# Patient Record
Sex: Female | Born: 1975 | Race: Black or African American | Hispanic: No | Marital: Single | Smoking: Former smoker
Health system: Southern US, Community
[De-identification: ages and names within clinical notes are randomized; demographics above are authoritative.]

## PROBLEM LIST (undated history)

## (undated) DIAGNOSIS — F32A Depression, unspecified: Secondary | ICD-10-CM

## (undated) DIAGNOSIS — F329 Major depressive disorder, single episode, unspecified: Secondary | ICD-10-CM

## (undated) HISTORY — DX: Major depressive disorder, single episode, unspecified: F32.9

## (undated) HISTORY — PX: TUBAL LIGATION: SHX77

## (undated) HISTORY — DX: Depression, unspecified: F32.A

## (undated) HISTORY — PX: EPIGASTRIC HERNIA REPAIR: SUR1177

---

## 1997-10-07 ENCOUNTER — Emergency Department (HOSPITAL_COMMUNITY): Admission: EM | Admit: 1997-10-07 | Discharge: 1997-10-07 | Payer: Self-pay

## 1997-11-06 ENCOUNTER — Inpatient Hospital Stay (HOSPITAL_COMMUNITY): Admission: AD | Admit: 1997-11-06 | Discharge: 1997-11-06 | Payer: Self-pay | Admitting: Obstetrics

## 1998-08-03 ENCOUNTER — Emergency Department (HOSPITAL_COMMUNITY): Admission: EM | Admit: 1998-08-03 | Discharge: 1998-08-03 | Payer: Self-pay | Admitting: Emergency Medicine

## 1998-10-06 ENCOUNTER — Emergency Department (HOSPITAL_COMMUNITY): Admission: EM | Admit: 1998-10-06 | Discharge: 1998-10-06 | Payer: Self-pay | Admitting: Emergency Medicine

## 1998-11-24 ENCOUNTER — Emergency Department (HOSPITAL_COMMUNITY): Admission: EM | Admit: 1998-11-24 | Discharge: 1998-11-24 | Payer: Self-pay | Admitting: Internal Medicine

## 1999-02-12 ENCOUNTER — Inpatient Hospital Stay (HOSPITAL_COMMUNITY): Admission: AD | Admit: 1999-02-12 | Discharge: 1999-02-12 | Payer: Self-pay | Admitting: *Deleted

## 1999-04-03 ENCOUNTER — Inpatient Hospital Stay (HOSPITAL_COMMUNITY): Admission: AD | Admit: 1999-04-03 | Discharge: 1999-04-03 | Payer: Self-pay | Admitting: *Deleted

## 1999-05-07 ENCOUNTER — Emergency Department (HOSPITAL_COMMUNITY): Admission: EM | Admit: 1999-05-07 | Discharge: 1999-05-07 | Payer: Self-pay | Admitting: Emergency Medicine

## 1999-06-29 ENCOUNTER — Encounter: Payer: Self-pay | Admitting: Emergency Medicine

## 1999-06-29 ENCOUNTER — Emergency Department (HOSPITAL_COMMUNITY): Admission: EM | Admit: 1999-06-29 | Discharge: 1999-06-29 | Payer: Self-pay | Admitting: Emergency Medicine

## 1999-09-16 ENCOUNTER — Inpatient Hospital Stay (HOSPITAL_COMMUNITY): Admission: EM | Admit: 1999-09-16 | Discharge: 1999-09-17 | Payer: Self-pay | Admitting: *Deleted

## 1999-09-16 ENCOUNTER — Encounter: Payer: Self-pay | Admitting: Emergency Medicine

## 2000-01-04 ENCOUNTER — Inpatient Hospital Stay (HOSPITAL_COMMUNITY): Admission: AD | Admit: 2000-01-04 | Discharge: 2000-01-04 | Payer: Self-pay | Admitting: Obstetrics

## 2000-01-04 ENCOUNTER — Encounter: Payer: Self-pay | Admitting: *Deleted

## 2000-01-20 ENCOUNTER — Inpatient Hospital Stay (HOSPITAL_COMMUNITY): Admission: AD | Admit: 2000-01-20 | Discharge: 2000-01-20 | Payer: Self-pay | Admitting: Obstetrics

## 2000-06-26 ENCOUNTER — Inpatient Hospital Stay (HOSPITAL_COMMUNITY): Admission: AD | Admit: 2000-06-26 | Discharge: 2000-06-26 | Payer: Self-pay | Admitting: Obstetrics & Gynecology

## 2003-06-16 ENCOUNTER — Other Ambulatory Visit: Admission: RE | Admit: 2003-06-16 | Discharge: 2003-06-16 | Payer: Self-pay | Admitting: Obstetrics and Gynecology

## 2003-09-25 ENCOUNTER — Inpatient Hospital Stay (HOSPITAL_COMMUNITY): Admission: AD | Admit: 2003-09-25 | Discharge: 2003-09-25 | Payer: Self-pay | Admitting: *Deleted

## 2003-10-12 ENCOUNTER — Observation Stay (HOSPITAL_COMMUNITY): Admission: AD | Admit: 2003-10-12 | Discharge: 2003-10-13 | Payer: Self-pay | Admitting: Obstetrics and Gynecology

## 2003-11-27 ENCOUNTER — Inpatient Hospital Stay (HOSPITAL_COMMUNITY): Admission: AD | Admit: 2003-11-27 | Discharge: 2003-11-27 | Payer: Self-pay | Admitting: Obstetrics and Gynecology

## 2003-12-01 ENCOUNTER — Inpatient Hospital Stay (HOSPITAL_COMMUNITY): Admission: AD | Admit: 2003-12-01 | Discharge: 2003-12-01 | Payer: Self-pay | Admitting: Obstetrics and Gynecology

## 2003-12-02 ENCOUNTER — Inpatient Hospital Stay (HOSPITAL_COMMUNITY): Admission: AD | Admit: 2003-12-02 | Discharge: 2003-12-02 | Payer: Self-pay | Admitting: Obstetrics and Gynecology

## 2004-01-05 ENCOUNTER — Inpatient Hospital Stay (HOSPITAL_COMMUNITY): Admission: AD | Admit: 2004-01-05 | Discharge: 2004-01-05 | Payer: Self-pay | Admitting: Obstetrics and Gynecology

## 2004-01-08 ENCOUNTER — Inpatient Hospital Stay (HOSPITAL_COMMUNITY): Admission: AD | Admit: 2004-01-08 | Discharge: 2004-01-08 | Payer: Self-pay | Admitting: Obstetrics and Gynecology

## 2004-01-20 ENCOUNTER — Inpatient Hospital Stay (HOSPITAL_COMMUNITY): Admission: AD | Admit: 2004-01-20 | Discharge: 2004-01-20 | Payer: Self-pay | Admitting: Obstetrics and Gynecology

## 2004-01-31 ENCOUNTER — Inpatient Hospital Stay (HOSPITAL_COMMUNITY): Admission: AD | Admit: 2004-01-31 | Discharge: 2004-01-31 | Payer: Self-pay | Admitting: Obstetrics and Gynecology

## 2004-02-02 ENCOUNTER — Inpatient Hospital Stay (HOSPITAL_COMMUNITY): Admission: AD | Admit: 2004-02-02 | Discharge: 2004-02-04 | Payer: Self-pay | Admitting: Obstetrics and Gynecology

## 2004-04-01 ENCOUNTER — Emergency Department (HOSPITAL_COMMUNITY): Admission: EM | Admit: 2004-04-01 | Discharge: 2004-04-01 | Payer: Self-pay | Admitting: Emergency Medicine

## 2004-09-30 ENCOUNTER — Emergency Department (HOSPITAL_COMMUNITY): Admission: EM | Admit: 2004-09-30 | Discharge: 2004-10-01 | Payer: Self-pay | Admitting: Emergency Medicine

## 2005-04-17 ENCOUNTER — Emergency Department (HOSPITAL_COMMUNITY): Admission: EM | Admit: 2005-04-17 | Discharge: 2005-04-17 | Payer: Self-pay | Admitting: Emergency Medicine

## 2006-02-27 ENCOUNTER — Emergency Department (HOSPITAL_COMMUNITY): Admission: EM | Admit: 2006-02-27 | Discharge: 2006-02-27 | Payer: Self-pay | Admitting: Emergency Medicine

## 2006-03-30 ENCOUNTER — Inpatient Hospital Stay (HOSPITAL_COMMUNITY): Admission: AD | Admit: 2006-03-30 | Discharge: 2006-03-30 | Payer: Self-pay | Admitting: Obstetrics & Gynecology

## 2006-04-02 ENCOUNTER — Inpatient Hospital Stay (HOSPITAL_COMMUNITY): Admission: AD | Admit: 2006-04-02 | Discharge: 2006-04-02 | Payer: Self-pay | Admitting: Obstetrics

## 2006-05-05 ENCOUNTER — Emergency Department (HOSPITAL_COMMUNITY): Admission: EM | Admit: 2006-05-05 | Discharge: 2006-05-05 | Payer: Self-pay | Admitting: Emergency Medicine

## 2006-05-07 ENCOUNTER — Inpatient Hospital Stay (HOSPITAL_COMMUNITY): Admission: AD | Admit: 2006-05-07 | Discharge: 2006-05-07 | Payer: Self-pay | Admitting: Obstetrics & Gynecology

## 2006-05-13 ENCOUNTER — Inpatient Hospital Stay (HOSPITAL_COMMUNITY): Admission: AD | Admit: 2006-05-13 | Discharge: 2006-05-13 | Payer: Self-pay | Admitting: Obstetrics and Gynecology

## 2006-05-29 ENCOUNTER — Inpatient Hospital Stay (HOSPITAL_COMMUNITY): Admission: AD | Admit: 2006-05-29 | Discharge: 2006-05-29 | Payer: Self-pay | Admitting: Obstetrics and Gynecology

## 2006-06-26 ENCOUNTER — Inpatient Hospital Stay (HOSPITAL_COMMUNITY): Admission: AD | Admit: 2006-06-26 | Discharge: 2006-06-26 | Payer: Self-pay | Admitting: Obstetrics and Gynecology

## 2006-07-09 ENCOUNTER — Inpatient Hospital Stay (HOSPITAL_COMMUNITY): Admission: AD | Admit: 2006-07-09 | Discharge: 2006-07-09 | Payer: Self-pay | Admitting: Obstetrics and Gynecology

## 2006-08-08 ENCOUNTER — Inpatient Hospital Stay (HOSPITAL_COMMUNITY): Admission: AD | Admit: 2006-08-08 | Discharge: 2006-08-08 | Payer: Self-pay | Admitting: Obstetrics and Gynecology

## 2006-08-17 ENCOUNTER — Inpatient Hospital Stay (HOSPITAL_COMMUNITY): Admission: AD | Admit: 2006-08-17 | Discharge: 2006-08-17 | Payer: Self-pay | Admitting: Obstetrics and Gynecology

## 2006-08-25 ENCOUNTER — Inpatient Hospital Stay (HOSPITAL_COMMUNITY): Admission: AD | Admit: 2006-08-25 | Discharge: 2006-08-25 | Payer: Self-pay | Admitting: Obstetrics and Gynecology

## 2006-08-27 ENCOUNTER — Inpatient Hospital Stay (HOSPITAL_COMMUNITY): Admission: AD | Admit: 2006-08-27 | Discharge: 2006-08-27 | Payer: Self-pay | Admitting: Obstetrics and Gynecology

## 2006-08-28 ENCOUNTER — Inpatient Hospital Stay (HOSPITAL_COMMUNITY): Admission: AD | Admit: 2006-08-28 | Discharge: 2006-08-28 | Payer: Self-pay | Admitting: Obstetrics and Gynecology

## 2006-09-02 ENCOUNTER — Inpatient Hospital Stay (HOSPITAL_COMMUNITY): Admission: AD | Admit: 2006-09-02 | Discharge: 2006-09-03 | Payer: Self-pay | Admitting: Obstetrics and Gynecology

## 2006-09-26 ENCOUNTER — Inpatient Hospital Stay (HOSPITAL_COMMUNITY): Admission: AD | Admit: 2006-09-26 | Discharge: 2006-09-26 | Payer: Self-pay | Admitting: Obstetrics and Gynecology

## 2006-10-09 ENCOUNTER — Inpatient Hospital Stay (HOSPITAL_COMMUNITY): Admission: AD | Admit: 2006-10-09 | Discharge: 2006-10-10 | Payer: Self-pay | Admitting: Obstetrics and Gynecology

## 2006-10-27 ENCOUNTER — Inpatient Hospital Stay (HOSPITAL_COMMUNITY): Admission: AD | Admit: 2006-10-27 | Discharge: 2006-10-27 | Payer: Self-pay | Admitting: Obstetrics and Gynecology

## 2006-11-01 ENCOUNTER — Inpatient Hospital Stay (HOSPITAL_COMMUNITY): Admission: AD | Admit: 2006-11-01 | Discharge: 2006-11-01 | Payer: Self-pay | Admitting: Obstetrics and Gynecology

## 2006-11-03 ENCOUNTER — Inpatient Hospital Stay (HOSPITAL_COMMUNITY): Admission: AD | Admit: 2006-11-03 | Discharge: 2006-11-03 | Payer: Self-pay | Admitting: Obstetrics and Gynecology

## 2006-11-10 ENCOUNTER — Inpatient Hospital Stay (HOSPITAL_COMMUNITY): Admission: AD | Admit: 2006-11-10 | Discharge: 2006-11-11 | Payer: Self-pay | Admitting: Obstetrics and Gynecology

## 2006-11-14 ENCOUNTER — Encounter (INDEPENDENT_AMBULATORY_CARE_PROVIDER_SITE_OTHER): Payer: Self-pay | Admitting: Obstetrics and Gynecology

## 2006-11-14 ENCOUNTER — Inpatient Hospital Stay (HOSPITAL_COMMUNITY): Admission: AD | Admit: 2006-11-14 | Discharge: 2006-11-17 | Payer: Self-pay | Admitting: Obstetrics and Gynecology

## 2007-04-09 ENCOUNTER — Emergency Department (HOSPITAL_COMMUNITY): Admission: EM | Admit: 2007-04-09 | Discharge: 2007-04-09 | Payer: Self-pay | Admitting: Emergency Medicine

## 2007-09-13 ENCOUNTER — Emergency Department (HOSPITAL_COMMUNITY): Admission: EM | Admit: 2007-09-13 | Discharge: 2007-09-14 | Payer: Self-pay | Admitting: Emergency Medicine

## 2008-02-29 ENCOUNTER — Emergency Department (HOSPITAL_BASED_OUTPATIENT_CLINIC_OR_DEPARTMENT_OTHER): Admission: EM | Admit: 2008-02-29 | Discharge: 2008-02-29 | Payer: Self-pay | Admitting: Emergency Medicine

## 2008-09-19 ENCOUNTER — Emergency Department (HOSPITAL_COMMUNITY): Admission: EM | Admit: 2008-09-19 | Discharge: 2008-09-19 | Payer: Self-pay | Admitting: Emergency Medicine

## 2009-01-11 ENCOUNTER — Emergency Department (HOSPITAL_COMMUNITY): Admission: EM | Admit: 2009-01-11 | Discharge: 2009-01-11 | Payer: Self-pay | Admitting: Emergency Medicine

## 2009-08-10 ENCOUNTER — Emergency Department (HOSPITAL_COMMUNITY): Admission: EM | Admit: 2009-08-10 | Discharge: 2009-08-11 | Payer: Self-pay | Admitting: Emergency Medicine

## 2009-12-10 ENCOUNTER — Emergency Department (HOSPITAL_COMMUNITY): Admission: EM | Admit: 2009-12-10 | Discharge: 2009-12-10 | Payer: Self-pay | Admitting: Emergency Medicine

## 2010-08-13 LAB — DIFFERENTIAL
Lymphocytes Relative: 40 % (ref 12–46)
Lymphs Abs: 3 10*3/uL (ref 0.7–4.0)
Monocytes Absolute: 0.5 10*3/uL (ref 0.1–1.0)
Neutro Abs: 3.6 10*3/uL (ref 1.7–7.7)

## 2010-08-13 LAB — URINALYSIS, ROUTINE W REFLEX MICROSCOPIC
Glucose, UA: NEGATIVE mg/dL
Nitrite: NEGATIVE
pH: 6 (ref 5.0–8.0)

## 2010-08-13 LAB — CBC
HCT: 27.7 % — ABNORMAL LOW (ref 36.0–46.0)
MCHC: 31 g/dL (ref 30.0–36.0)
RBC: 4.21 MIL/uL (ref 3.87–5.11)
WBC: 7.6 10*3/uL (ref 4.0–10.5)

## 2010-08-13 LAB — POCT I-STAT, CHEM 8
BUN: 7 mg/dL (ref 6–23)
Calcium, Ion: 1.34 mmol/L — ABNORMAL HIGH (ref 1.12–1.32)
Chloride: 104 mEq/L (ref 96–112)
TCO2: 28 mmol/L (ref 0–100)

## 2010-08-13 LAB — POCT CARDIAC MARKERS

## 2010-09-17 NOTE — Op Note (Signed)
Christina Valenzuela, Christina Valenzuela                ACCOUNT NO.:  [id number]   MEDICAL RECORD NO.:  [id number]          PATIENT TYPE:  INP   LOCATION:  9148                          FACILITY:  [facility]   PHYSICIAN:  Hal Morales, M.D.DATE OF BIRTH:  [birth year]   DATE OF PROCEDURE:  11/14/2006  DATE OF DISCHARGE:                               OPERATIVE REPORT   PREOPERATIVE DIAGNOSES:  Intrauterine pregnancy at 37-4/[redacted] weeks  gestation, prior cesarean section, nonreassuring fetal heart rate  tracing, maternal exhaustion, maternal anemia and desire for surgical  sterilization.   POSTOPERATIVE DIAGNOSES:  Intrauterine pregnancy at 37-4/[redacted] weeks  gestation, prior cesarean section, nonreassuring fetal heart rate  tracing, maternal exhaustion, maternal anemia and desire for surgical  sterilization.   OPERATION:  Repeat low transverse cesarean section and bilateral tubal  ligation.   SURGEON:  Hal Morales, M.D.   FIRST ASSISTANT:  Erin Sons, certified nurse midwife.   ANESTHESIA:  Epidural.   ESTIMATED BLOOD LOSS:  500 mL.   COMPLICATIONS:  None.   FINDINGS:  The patient was delivered of a female infant whose name is  [place] weighing 6 pounds with Apgars of 9 and 10 at 1 and 5 minutes  respectively.  The uterus, tubes and ovaries were normal for the gravid  state.  The placenta contained an eccentrically inserted three-vessel  cord.   PROCEDURE:  A discussion was held with the patient and father of the  pregnancy concerning progress of the labor.  The patient had become  completely dilated and had pushed for short period of time, however was  feeling significant exhaustion and stated that she was unable to push  any longer.  She complained of dizziness.  At this time the fetal heart  rate began to exhibit deep variable decelerations to the 70s with each  contraction.  The fetal vertex was at +3.  At that time a discussion was  held that several options existed which was observation, one  was  continued maternal pushing, another was vacuum-assisted vaginal  delivery, and another was repeat cesarean section.  The risks and  benefits of each of these options was reviewed and the patient decided  to proceed with repeat low transverse cesarean section.   PROCEDURE:  The patient was brought to the operating room with labor  epidural in place.  This was dosed for surgical anesthesia.  She was  placed on the operating table in the supine position with a left lateral  tilt.  The abdomen and perineum were prepped with multiple layers of  Betadine and a Foley catheter inserted into the bladder and connected to  straight drainage.  The abdomen was draped as a sterile field.  Once  adequate surgical anesthesia was ensured approximately 17 mL of quarter  percent Marcaine was used to inject the site of the previous cesarean  section incision.  The transverse incision was made at the site and the  abdomen opened in layers.  The peritoneum was entered and the bladder  blade placed.  The uterus was incised approximately 2 cm above the  uterovesical fold and the  infant was delivered from the occiput  transverse position with the aid of a kiwi vacuum extractor.  After the  vertex had been pushed from the perineal area.  The nares and pharynx  were suctioned and the cord clamped and cut.  The infant was handed off  to the awaiting pediatricians.  The placenta was allowed to separate  from the uterus and was removed from the operative field.  The staff  from [facility] drew appropriate cord blood.  The  uterine incision was closed with a running interlocking suture of 0  Vicryl and imbricating suture of 0 Vicryl was placed with adequate  hemostasis.  Copious irrigation was carried out.  The left fallopian  tube was identified, followed to its fimbriated end, then grasped at the  isthmic portion and elevated.  A suture of 2-0 chromic was placed  through the mesosalpinx and  tied fore and aft on the knuckle of tube.  A  second ligature was placed proximal to that and the intervening knuckle  of tube excised, a small amount of 0.25% Marcaine placed over the tube  cut, and cut ends cauterized.  Hemostasis was noted to be adequate and a  similar procedure was carried out on the opposite side to the right  fallopian tube.  Portions of tube from the right and left were removed  from the operative field.  The abdominal peritoneum was closed with a  running suture of 2-0 Vicryl.  The rectus muscles were reapproximated in  the midline with figure-of-eight suture of 2-0 Vicryl.  The rectus  fascia was closed with a running suture of 0 Vicryl then reinforced on  either side of midline with figure-of-eight sutures of 0 Vicryl.  The  subcutaneous tissue was copiously irrigated and made hemostatic with  Bovie cautery.  A subcuticular suture of 3-0 Monocryl was used to close  the skin incision.  Steri-Strips were applied to the incision and a  sterile dressing applied.  The patient was taken from the operating room  to the recovery room in satisfactory condition having tolerated the  procedure well with sponge and instrument counts correct.   SPECIMENS:  The placenta was sent to labor and delivery.      Hal Morales, M.D.  Electronically Signed     VPH/MEDQ  D:  11/14/2006  T:  11/15/2006  Job:  [id number]
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
## 2010-09-20 NOTE — Op Note (Signed)
[place]  Patient:    ALLEYA, DEMETER                         MRN: [id number] Proc. Date: 09/16/99 Adm. Date:  [id number] Attending:  Glenna Fellows [place]                           Operative Report  PREOPERATIVE DIAGNOSIS:  Incarcerated epigastric hernia.  POSTOPERATIVE DIAGNOSIS:  Incarcerated epigastric hernia.  PROCEDURE:  Repair of incarcerated epigastric hernia with mesh.  SURGEON:  Lorne Skeens. Hoxworth, M.D.  ANESTHESIA:  General anesthesia.  INDICATIONS:  Sereniti Wan is a 35 year old black female who presents with 18 hours of a painful mass in her epigastrium and examination shows a tender 2 cm mass above the umbilicus consistent with an incarcerated hernia.  She has had nausea without vomiting.  Emergency repair and general anesthesia has been recommended and accepted.  The nature of the procedure, indications, risks of bleeding, infection, and recurrence was discussed, understood preoperatively.  DESCRIPTION OF PROCEDURE:  The patient was brought to the operating room and placed in the supine position on the operating table and general endotracheal anesthesia was induced.  Ancef was given intravenously.  The abdomen was sterilely prepped and draped.  A vertical incision was made directly over the mass with dissection carried down through the subcutaneous tissue.  The hernia sac was sharply dissected free from surrounding subcutaneous tissue, down to the level of the fascia. The hernia content spontaneously reduced during dissection.  The hernia sac was opened and it contained no incarcerated viscera at that time and the sac was completely excised at the level of the fascia with cautery.  The defect was about 1 cm.  A  couple of [employer] retractors were placed through the defect and the underlying viscera examined and there was no evidence of any intestinal damage or cloudy fluid.  Following this, the subcutaneous  tissue was cleared back off the fascia for several cm in each direction.  The fascial defect was then primarily closed with interrupted 0 Prolene.  A several cm Prolene mesh patch was then used as an onlay and sutured to the periphery with interrupted 2-0 Prolene.  The soft tissue was  infiltrated with Marcaine, irrigated, and inspected for complete hemostasis. The subcu was reapproximated with interrupted 4-0 Monocryl and the skin with running subcuticular 4-0 Monocryl and Steri-Strips.  Sponge, needle, and instrument counts were correct.  A dry sterile dressing was applied and the patient taken to the recovery room in good condition. DD:  09/16/99 TD:  09/17/99 Job: [id number]
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
## 2010-09-20 NOTE — Discharge Summary (Signed)
Christina Valenzuela, Christina Valenzuela NO.:  [id number]   MEDICAL RECORD NO.:  [id number]                   PATIENT TYPE:  INP   LOCATION:  9153                                 [facility]ACILITY:  [facility]   PHYSICIAN:  Janine Limbo, M.D.            DATE OF BIRTH:  April 17, 1976   DATE OF ADMISSION:  10/12/2003  DATE OF DISCHARGE:  10/13/2003                                 DISCHARGE SUMMARY   ADMISSION DIAGNOSES:  1. Intrauterine pregnancy at 23 weeks.  2. Pre-term cervical change.  3. No active labor noted.   DISCHARGE DIAGNOSES:  1. Intrauterine pregnancy at 23 weeks.  2. Pre-term cervical change.  3. No active labor noted.  4. Fetal fibronectin negative.   PROCEDURE:  1. Electronic fetal monitoring.  2. Bedside ultrasound.   HOSPITAL COURSE:  The patient was admitted with abdominal cramping and was  noted to have a cervix dilated slightly at fingertip 50% with the vertex  ballottable but pressure against the cervix was noted by the vertex, which  was presenting. She was tested for infections and fetal fibronectin and  admitted for medication, bedrest, and monitoring. She did well overnight,  having few contractions. On October 13, 2003, she was having some back pain,  which had been pre-existing from months previously but did not have any  contractions. Fetal heart rate was 140 to 150 with rare uterine  contractions, positive fetal movement. Abdomen was soft and nontender.  Cervix was closed 25% and ballottable with no pressure appreciated on the  cervix and decision was made at that time to discharge her home on Ibuprofen  and return to the office in 1 week.   DISCHARGE MEDICATIONS:  Motrin 600 mg p.o. q. 6 hours p.r.n.   LABORATORY DATA:  Gonorrhea and Chlamydia negative. Urinalysis showed few  bacteria. Culture is pending. Wet prep remarkable only for a few white blood  cells with many bacteria seen. Group B strep pending. Fetal fibronectin  negative.   DISCHARGE  INSTRUCTIONS:  The patient is to maintain level 2 bedrest and  monitor for contractions and fetal movement and report any problems.   FOLLOW UP:  In 1 week at [facility] and Gynecologic  Services or p.r.n.     Marie L. Williams, C.N.M.                 Janine Limbo, M.D.   MLW/MEDQ  D:  10/13/2003  T:  10/14/2003  Job:  [id number]

## 2011-02-18 LAB — TYPE AND SCREEN
ABO/RH(D): O POS
Antibody Screen: POSITIVE
DAT, IgG: NEGATIVE
PT AG Type: NEGATIVE

## 2011-02-18 LAB — CBC
HCT: 24 — ABNORMAL LOW
Hemoglobin: 7.7 — CL
MCHC: 31.9
MCV: 67.9 — ABNORMAL LOW
MCV: 69.1 — ABNORMAL LOW
Platelets: 394
RBC: 3.53 — ABNORMAL LOW
RDW: 17.8 — ABNORMAL HIGH
WBC: 15.1 — ABNORMAL HIGH

## 2011-02-18 LAB — RPR: RPR Ser Ql: NONREACTIVE

## 2011-02-19 LAB — URINALYSIS, ROUTINE W REFLEX MICROSCOPIC
Bilirubin Urine: NEGATIVE
Nitrite: NEGATIVE
Protein, ur: 30 — AB
Protein, ur: NEGATIVE
pH: 6
pH: 6.5

## 2011-02-20 LAB — URINALYSIS, ROUTINE W REFLEX MICROSCOPIC
Bilirubin Urine: NEGATIVE
Hgb urine dipstick: NEGATIVE
Ketones, ur: 15 — AB
Nitrite: NEGATIVE
Urobilinogen, UA: 4 — ABNORMAL HIGH
pH: 6.5

## 2011-02-20 LAB — STREP B DNA PROBE: Strep Group B Ag: NEGATIVE

## 2011-02-20 LAB — GC/CHLAMYDIA PROBE AMP, GENITAL: GC Probe Amp, Genital: NEGATIVE

## 2011-02-20 LAB — FETAL FIBRONECTIN: Fetal Fibronectin: NEGATIVE

## 2011-02-23 ENCOUNTER — Emergency Department (HOSPITAL_COMMUNITY)
Admission: EM | Admit: 2011-02-23 | Discharge: 2011-02-23 | Disposition: A | Discharge status: Home / Self Care | Payer: Self-pay | Attending: Emergency Medicine | Admitting: Emergency Medicine

## 2011-02-23 DIAGNOSIS — R109 Unspecified abdominal pain: Secondary | ICD-10-CM | POA: Insufficient documentation

## 2011-02-23 DIAGNOSIS — R10819 Abdominal tenderness, unspecified site: Secondary | ICD-10-CM | POA: Insufficient documentation

## 2011-02-23 DIAGNOSIS — K5900 Constipation, unspecified: Secondary | ICD-10-CM | POA: Insufficient documentation

## 2011-02-23 LAB — URINALYSIS, ROUTINE W REFLEX MICROSCOPIC
Bilirubin Urine: NEGATIVE
Glucose, UA: NEGATIVE mg/dL
Hgb urine dipstick: NEGATIVE
Ketones, ur: NEGATIVE mg/dL
Nitrite: NEGATIVE
Protein, ur: NEGATIVE mg/dL
Specific Gravity, Urine: 1.026 (ref 1.005–1.030)
pH: 5 (ref 5.0–8.0)

## 2011-02-23 LAB — WET PREP, GENITAL
Clue Cells Wet Prep HPF POC: NONE SEEN
WBC, Wet Prep HPF POC: NONE SEEN
Yeast Wet Prep HPF POC: NONE SEEN

## 2011-02-23 LAB — PREGNANCY, URINE: Preg Test, Ur: NEGATIVE

## 2011-07-07 ENCOUNTER — Other Ambulatory Visit: Payer: Self-pay | Admitting: Obstetrics

## 2011-07-17 ENCOUNTER — Emergency Department (HOSPITAL_BASED_OUTPATIENT_CLINIC_OR_DEPARTMENT_OTHER)
Admission: EM | Admit: 2011-07-17 | Discharge: 2011-07-17 | Disposition: A | Discharge status: Home Health | Payer: Self-pay | Attending: Emergency Medicine | Admitting: Emergency Medicine

## 2011-07-17 ENCOUNTER — Encounter (HOSPITAL_BASED_OUTPATIENT_CLINIC_OR_DEPARTMENT_OTHER): Payer: Self-pay | Admitting: *Deleted

## 2011-07-17 DIAGNOSIS — B9689 Other specified bacterial agents as the cause of diseases classified elsewhere: Secondary | ICD-10-CM | POA: Insufficient documentation

## 2011-07-17 DIAGNOSIS — R109 Unspecified abdominal pain: Secondary | ICD-10-CM | POA: Insufficient documentation

## 2011-07-17 DIAGNOSIS — N760 Acute vaginitis: Secondary | ICD-10-CM | POA: Insufficient documentation

## 2011-07-17 DIAGNOSIS — A499 Bacterial infection, unspecified: Secondary | ICD-10-CM | POA: Insufficient documentation

## 2011-07-17 DIAGNOSIS — K5900 Constipation, unspecified: Secondary | ICD-10-CM | POA: Insufficient documentation

## 2011-07-17 LAB — URINALYSIS, ROUTINE W REFLEX MICROSCOPIC
Glucose, UA: NEGATIVE mg/dL
Hgb urine dipstick: NEGATIVE
Ketones, ur: 15 mg/dL — AB
Protein, ur: NEGATIVE mg/dL

## 2011-07-17 LAB — WET PREP, GENITAL: WBC, Wet Prep HPF POC: NONE SEEN

## 2011-07-17 LAB — PREGNANCY, URINE: Preg Test, Ur: NEGATIVE

## 2011-07-17 MED ORDER — METRONIDAZOLE 500 MG PO TABS: 500.0000 mg | ORAL_TABLET | Freq: Two times a day (BID) | ORAL | Status: AC

## 2011-07-17 NOTE — ED Notes (Signed)
Pt. Reports she had diarrhea last wk till Monday.  Pt. Reports trouble with bowels.
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
## 2011-07-17 NOTE — Discharge Instructions (Signed)
Bacterial Vaginosis Bacterial vaginosis is an infection of the vagina. A healthy vagina has many kinds of good germs (bacteria). Sometimes the number of good germs can change. This allows bad germs to move in and cause an infection. You may be given medicine (antibiotics) to treat the infection. Or, you may not need treatment at all. HOME CARE  Take your medicine as told. Finish them even if you start to feel better.   Do not have sex until you finish your medicine.   Do not douche.   Practice safe sex.   Tell your sex partner that you have an infection. They should see their doctor for treatment if they have problems.  GET HELP RIGHT AWAY IF:  You do not get better after 3 days of treatment.   You have grey fluid (discharge) coming from your vagina.   You have pain.   You have a temperature of 102 F (38.9 C) or higher.  MAKE SURE YOU:   Understand these instructions.   Will watch your condition.   Will get help right away if you are not doing well or get worse.  Document Released: 01/29/2008 Document Revised: 04/10/2011 Document Reviewed: 01/29/2008 [facility] Patient Information 2012 [place], [place].Constipation in Adults Constipation is having fewer than 2 bowel movements per week. Usually, the stools are hard. As we grow older, constipation is more common. If you try to fix constipation with laxatives, the problem may get worse. This is because laxatives taken over a long period of time make the colon muscles weaker. A low-fiber diet, not taking in enough fluids, and taking some medicines may make these problems worse. MEDICATIONS THAT MAY CAUSE CONSTIPATION  Water pills (diuretics).   Calcium channel blockers (used to control blood pressure and for the heart).   Certain pain medicines (narcotics).   Anticholinergics.   Anti-inflammatory agents.   Antacids that contain aluminum.  DISEASES THAT CONTRIBUTE TO CONSTIPATION  Diabetes.   Parkinson's disease.    Dementia.   Stroke.   Depression.   Illnesses that cause problems with salt and water metabolism.  HOME CARE INSTRUCTIONS   Constipation is usually best cared for without medicines. Increasing dietary fiber and eating more fruits and vegetables is the best way to manage constipation.   Slowly increase fiber intake to 25 to 38 grams per day. Whole grains, fruits, vegetables, and legumes are good sources of fiber. A dietitian can further help you incorporate high-fiber foods into your diet.   Drink enough water and fluids to keep your urine clear or pale yellow.   A fiber supplement may be added to your diet if you cannot get enough fiber from foods.   Increasing your activities also helps improve regularity.   Suppositories, as suggested by your caregiver, will also help. If you are using antacids, such as aluminum or calcium containing products, it will be helpful to switch to products containing magnesium if your caregiver says it is okay.   If you have been given a liquid injection (enema) today, this is only a temporary measure. It should not be relied on for treatment of longstanding (chronic) constipation.   Stronger measures, such as magnesium sulfate, should be avoided if possible. This may cause uncontrollable diarrhea. Using magnesium sulfate may not allow you time to make it to the bathroom.  SEEK IMMEDIATE MEDICAL CARE IF:   There is bright red blood in the stool.   The constipation stays for more than 4 days.   There is belly (abdominal) or  rectal pain.   You do not seem to be getting better.   You have any questions or concerns.  MAKE SURE YOU:   Understand these instructions.   Will watch your condition.   Will get help right away if you are not doing well or get worse.  Document Released: 01/18/2004 Document Revised: 04/10/2011 Document Reviewed: 03/25/2011 [facility] Patient Information 2012 [place], [place].

## 2011-07-18 LAB — GC/CHLAMYDIA PROBE AMP, GENITAL: Chlamydia, DNA Probe: NEGATIVE

## 2011-07-18 NOTE — ED Provider Notes (Signed)
Medical screening examination/treatment/procedure(s) were performed by non-physician practitioner and as supervising physician I was immediately available for consultation/collaboration.   Shelda Jakes, MD 07/18/11 2110

## 2011-08-03 ENCOUNTER — Encounter (HOSPITAL_BASED_OUTPATIENT_CLINIC_OR_DEPARTMENT_OTHER): Payer: Self-pay | Admitting: Emergency Medicine

## 2011-08-03 ENCOUNTER — Emergency Department (INDEPENDENT_AMBULATORY_CARE_PROVIDER_SITE_OTHER): Payer: Self-pay

## 2011-08-03 ENCOUNTER — Emergency Department (HOSPITAL_BASED_OUTPATIENT_CLINIC_OR_DEPARTMENT_OTHER): Payer: Self-pay

## 2011-08-03 ENCOUNTER — Emergency Department (HOSPITAL_BASED_OUTPATIENT_CLINIC_OR_DEPARTMENT_OTHER)
Admission: EM | Admit: 2011-08-03 | Discharge: 2011-08-03 | Disposition: A | Discharge status: Home / Self Care | Payer: Self-pay | Attending: Emergency Medicine | Admitting: Emergency Medicine

## 2011-08-03 DIAGNOSIS — S46909A Unspecified injury of unspecified muscle, fascia and tendon at shoulder and upper arm level, unspecified arm, initial encounter: Secondary | ICD-10-CM

## 2011-08-03 DIAGNOSIS — S4980XA Other specified injuries of shoulder and upper arm, unspecified arm, initial encounter: Secondary | ICD-10-CM

## 2011-08-03 DIAGNOSIS — IMO0001 Reserved for inherently not codable concepts without codable children: Secondary | ICD-10-CM | POA: Insufficient documentation

## 2011-08-03 DIAGNOSIS — M25519 Pain in unspecified shoulder: Secondary | ICD-10-CM | POA: Insufficient documentation

## 2011-08-03 DIAGNOSIS — X58XXXA Exposure to other specified factors, initial encounter: Secondary | ICD-10-CM

## 2011-08-03 DIAGNOSIS — Y92009 Unspecified place in unspecified non-institutional (private) residence as the place of occurrence of the external cause: Secondary | ICD-10-CM | POA: Insufficient documentation

## 2011-08-03 DIAGNOSIS — IMO0002 Reserved for concepts with insufficient information to code with codable children: Secondary | ICD-10-CM | POA: Insufficient documentation

## 2011-08-03 MED ORDER — IBUPROFEN 800 MG PO TABS: 800.0000 mg | ORAL_TABLET | Freq: Three times a day (TID) | ORAL | Status: DC

## 2011-08-03 MED ORDER — HYDROCODONE-ACETAMINOPHEN 5-325 MG PO TABS: 2.0000 | ORAL_TABLET | ORAL | Status: AC | PRN

## 2011-08-03 MED ORDER — IBUPROFEN 800 MG PO TABS: 800.0000 mg | ORAL_TABLET | Freq: Three times a day (TID) | ORAL | Status: AC
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
## 2011-08-03 NOTE — Discharge Instructions (Signed)
Ligament Sprain  Ligaments are tough, fibrous tissues that hold bones together at the joints. A sprain can occur when a ligament is stretched. This injury may take several weeks to heal.  HOME CARE INSTRUCTIONS    Rest the injured area for as long as directed by your caregiver. Then slowly start using the joint as directed by your caregiver and as the pain allows.   Keep the affected joint raised if possible to lessen swelling.   Apply ice for 15 to 20 minutes to the injured area every couple hours for the first half day, then 3 to 4 times per day for the first 48 hours. Put the ice in a plastic bag and place a towel between the bag of ice and your skin.   Wear any splinting, casting, or elastic bandage applications as instructed.   Only take over-the-counter or prescription medicines for pain, discomfort, or fever as directed by your caregiver. Do not use aspirin immediately after the injury unless instructed by your caregiver. Aspirin can cause increased bleeding and bruising of the tissues.   If you were given crutches, continue to use them as instructed and do not resume weight bearing on the affected extremity until instructed.  SEEK MEDICAL CARE IF:    Your bruising, swelling, or pain increases.   You have cold and numb fingers or toes if your arm or leg was injured.  SEEK IMMEDIATE MEDICAL CARE IF:    Your toes are numb or blue if your leg was injured.   Your fingers are numb or blue if your arm was injured.   Your pain is not responding to medicines and continues to stay the same or gets worse.  MAKE SURE YOU:    Understand these instructions.   Will watch your condition.   Will get help right away if you are not doing well or get worse.  Document Released: 04/18/2000 Document Revised: 04/10/2011 Document Reviewed: 02/15/2008  [employer] Patient Information 2012 [employer].

## 2011-08-04 NOTE — ED Provider Notes (Signed)
Medical screening examination/treatment/procedure(s) were performed by non-physician practitioner and as supervising physician I was immediately available for consultation/collaboration.   Forbes Cellar, MD 08/04/11 [phone]

## 2012-01-26 ENCOUNTER — Encounter (HOSPITAL_BASED_OUTPATIENT_CLINIC_OR_DEPARTMENT_OTHER): Payer: Self-pay | Admitting: *Deleted

## 2012-01-26 ENCOUNTER — Emergency Department (HOSPITAL_BASED_OUTPATIENT_CLINIC_OR_DEPARTMENT_OTHER)
Admission: EM | Admit: 2012-01-26 | Discharge: 2012-01-26 | Disposition: A | Discharge status: Home / Self Care | Payer: Self-pay | Attending: Emergency Medicine | Admitting: Emergency Medicine

## 2012-01-26 DIAGNOSIS — N760 Acute vaginitis: Secondary | ICD-10-CM | POA: Insufficient documentation

## 2012-01-26 DIAGNOSIS — A499 Bacterial infection, unspecified: Secondary | ICD-10-CM | POA: Insufficient documentation

## 2012-01-26 DIAGNOSIS — B9689 Other specified bacterial agents as the cause of diseases classified elsewhere: Secondary | ICD-10-CM | POA: Insufficient documentation

## 2012-01-26 DIAGNOSIS — F17200 Nicotine dependence, unspecified, uncomplicated: Secondary | ICD-10-CM | POA: Insufficient documentation

## 2012-01-26 LAB — URINALYSIS, ROUTINE W REFLEX MICROSCOPIC
Bilirubin Urine: NEGATIVE
Glucose, UA: NEGATIVE mg/dL
Ketones, ur: NEGATIVE mg/dL
Leukocytes, UA: NEGATIVE
Protein, ur: NEGATIVE mg/dL

## 2012-01-26 LAB — WET PREP, GENITAL

## 2012-01-26 MED ORDER — METRONIDAZOLE 500 MG PO TABS: 500.0000 mg | ORAL_TABLET | Freq: Two times a day (BID) | ORAL | Status: DC

## 2012-01-26 NOTE — ED Notes (Signed)
Pt amb to triage with quick steady gait in nad. Pt reports having vaginal itching x 3 months, states she used to take diflucan before she lost her health insurance and it cleared up her symptoms.  Pt states she has been using otc creams with no relief. Denies any spotting or bleeding.

## 2012-01-26 NOTE — Discharge Instructions (Signed)
It is important to complete the entire course of antibiotics. Return to the emergency department if your symptoms worsen. Bacterial Vaginosis Bacterial vaginosis (BV) is a vaginal infection where the normal balance of bacteria in the vagina is disrupted. The normal balance is then replaced by an overgrowth of certain bacteria. There are several different kinds of bacteria that can cause BV. BV is the most common vaginal infection in women of childbearing age. CAUSES   The cause of BV is not fully understood. BV develops when there is an increase or imbalance of harmful bacteria.   Some activities or behaviors can upset the normal balance of bacteria in the vagina and put women at increased risk including:   Having a new sex partner or multiple sex partners.   Douching.   Using an intrauterine device (IUD) for contraception.   It is not clear what role sexual activity plays in the development of BV. However, women that have never had sexual intercourse are rarely infected with BV.  Women do not get BV from toilet seats, bedding, swimming pools or from touching objects around them.  SYMPTOMS   Grey vaginal discharge.   A fish-like odor with discharge, especially after sexual intercourse.   Itching or burning of the vagina and vulva.   Burning or pain with urination.   Some women have no signs or symptoms at all.  DIAGNOSIS  Your caregiver must examine the vagina for signs of BV. Your caregiver will perform lab tests and look at the sample of vaginal fluid through a microscope. They will look for bacteria and abnormal cells (clue cells), a pH test higher than 4.5, and a positive amine test all associated with BV.  RISKS AND COMPLICATIONS   Pelvic inflammatory disease (PID).   Infections following gynecology surgery.   Developing HIV.   Developing herpes virus.  TREATMENT  Sometimes BV will clear up without treatment. However, all women with symptoms of BV should be treated to  avoid complications, especially if gynecology surgery is planned. Female partners generally do not need to be treated. However, BV may spread between female sex partners so treatment is helpful in preventing a recurrence of BV.   BV may be treated with antibiotics. The antibiotics come in either pill or vaginal cream forms. Either can be used with nonpregnant or pregnant women, but the recommended dosages differ. These antibiotics are not harmful to the baby.   BV can recur after treatment. If this happens, a second round of antibiotics will often be prescribed.   Treatment is important for pregnant women. If not treated, BV can cause a premature delivery, especially for a pregnant woman who had a premature birth in the past. All pregnant women who have symptoms of BV should be checked and treated.   For chronic reoccurrence of BV, treatment with a type of prescribed gel vaginally twice a week is helpful.  HOME CARE INSTRUCTIONS   Finish all medication as directed by your caregiver.   Do not have sex until treatment is completed.   Tell your sexual partner that you have a vaginal infection. They should see their caregiver and be treated if they have problems, such as a mild rash or itching.   Practice safe sex. Use condoms. Only have 1 sex partner.  PREVENTION  Basic prevention steps can help reduce the risk of upsetting the natural balance of bacteria in the vagina and developing BV:  Do not have sexual intercourse (be abstinent).   Do not douche.  Use all of the medicine prescribed for treatment of BV, even if the signs and symptoms go away.   Tell your sex partner if you have BV. That way, they can be treated, if needed, to prevent reoccurrence.  SEEK MEDICAL CARE IF:   Your symptoms are not improving after 3 days of treatment.   You have increased discharge, pain, or fever.  MAKE SURE YOU:   Understand these instructions.   Will watch your condition.   Will get help right  away if you are not doing well or get worse.  FOR MORE INFORMATION  Division of STD Prevention (DSTDP), Centers for Disease Control and Prevention: [employer] American Social Health Association (ASHA): www.ashastd.org  Document Released: 04/21/2005 Document Revised: 04/10/2011 Document Reviewed: 10/12/2008 [facility] Patient Information 2012 [place], [place].

## 2012-01-26 NOTE — ED Provider Notes (Signed)
History     CSN: [id number]  Arrival date & time 01/26/12  1129   First MD Initiated Contact with Patient 01/26/12 1205      Chief Complaint  Patient presents with  . Vaginal Itching    (Consider location/radiation/quality/duration/timing/severity/associated sxs/prior treatment) HPI Comments: 36 year old female presents to the ED complaining of right-sided vaginal irritation x 2 days. She states that she is prone to yeast infections, and has been having white discharge for the past couple weeks. The right side of her vagina is itchy. She's been using over-the-counter Monistat without any relief. Also admits to increased urinary frequency. Denies vaginal bleeding, however she just finished her menstrual cycle. Denies any abdominal or pelvic pain. Denies fever, chills, dysuria, hematuria, dyspareunia. She sexually active with her husband. No chance of pregnancy as her tubes are tied.  Patient is a 36 y.o. female presenting with vaginal itching. The history is provided by the patient.  Vaginal Itching Pertinent negatives include no abdominal pain, chest pain, chills, fever, nausea, rash or weakness.    History reviewed. No pertinent past medical history.  Past Surgical History  Procedure Date  . Tubal ligation   . Hernia repair   . Cesarean section     History reviewed. No pertinent family history.  History  Substance Use Topics  . Smoking status: Current Every Day Smoker  . Smokeless tobacco: Not on file  . Alcohol Use: Yes     social    OB History    Grav Para Term Preterm Abortions TAB SAB Ect Mult Living                  Review of Systems  Constitutional: Negative for fever, chills and activity change.  Respiratory: Negative for shortness of breath.   Cardiovascular: Negative for chest pain.  Gastrointestinal: Negative for nausea and abdominal pain.  Genitourinary: Positive for frequency, vaginal bleeding, vaginal discharge and vaginal pain. Negative for dysuria,  urgency, flank pain, difficulty urinating, menstrual problem and pelvic pain.  Musculoskeletal: Negative for back pain.  Skin: Negative for rash.  Neurological: Negative for weakness.    Allergies  Review of patient's allergies indicates no known allergies.  Home Medications   Current Outpatient Rx  Name Route Sig Dispense Refill  . CYCLOBENZAPRINE HCL 5 MG PO TABS Oral Take 5 mg by mouth 3 (three) times daily as needed. Patient used this medication for her shoulder pain.    . IBUPROFEN 200 MG PO TABS Oral Take 200 mg by mouth every 6 (six) hours as needed. Patient used this medication for cramps.      BP 100/69  Pulse 80  Temp 98.3 F (36.8 C) (Oral)  Resp 18  SpO2 100%  LMP 01/19/2012  Physical Exam  Constitutional: She is oriented to person, place, and time. She appears well-developed and well-nourished. No distress.  HENT:  Head: Normocephalic and atraumatic.  Eyes: Conjunctivae normal are normal.  Neck: Normal range of motion.  Cardiovascular: Normal rate, regular rhythm and normal heart sounds.   Pulmonary/Chest: Effort normal and breath sounds normal.  Abdominal: Soft. Bowel sounds are normal. She exhibits no mass. There is no tenderness. There is no CVA tenderness.  Genitourinary: Uterus normal. There is tenderness on the right labia. Cervix exhibits discharge (Thick white/yellow malodorous). Cervix exhibits no motion tenderness and no friability. Right adnexum displays no tenderness and no fullness. Left adnexum displays no tenderness and no fullness. There is erythema and tenderness around the vagina. No bleeding around the vagina.  Vaginal discharge (copious white/yellow malodorous) found.  Musculoskeletal: Normal range of motion.  Neurological: She is alert and oriented to person, place, and time.  Skin: Skin is warm and dry. No erythema.  Psychiatric: She has a normal mood and affect. Her behavior is normal.    ED Course  Procedures (including critical care  time)   Labs Reviewed  URINALYSIS, ROUTINE W REFLEX MICROSCOPIC  WET PREP, GENITAL  GC/CHLAMYDIA PROBE AMP, GENITAL   Results for orders placed during the hospital encounter of 01/26/12  URINALYSIS, ROUTINE W REFLEX MICROSCOPIC      Component Value Range   Color, Urine YELLOW  YELLOW   APPearance CLEAR  CLEAR   Specific Gravity, Urine 1.027  1.005 - 1.030   pH 6.0  5.0 - 8.0   Glucose, UA NEGATIVE  NEGATIVE mg/dL   Hgb urine dipstick NEGATIVE  NEGATIVE   Bilirubin Urine NEGATIVE  NEGATIVE   Ketones, ur NEGATIVE  NEGATIVE mg/dL   Protein, ur NEGATIVE  NEGATIVE mg/dL   Urobilinogen, UA 1.0  0.0 - 1.0 mg/dL   Nitrite NEGATIVE  NEGATIVE   Leukocytes, UA NEGATIVE  NEGATIVE  WET PREP, GENITAL      Component Value Range   Yeast Wet Prep HPF POC NONE SEEN  NONE SEEN   Trich, Wet Prep NONE SEEN  NONE SEEN   Clue Cells Wet Prep HPF POC MANY (*) NONE SEEN   WBC, Wet Prep HPF POC MANY (*) NONE SEEN    No results found.   1. BV (bacterial vaginosis)       MDM  36 year old female with many clue cells seen on wet prep which is consistent with the copious amount of malodorous discharge on exam. Will treat with Flagyl.        Trevor Mace, PA-C 01/26/12 1333

## 2012-01-26 NOTE — ED Provider Notes (Signed)
Medical screening examination/treatment/procedure(s) were performed by non-physician practitioner and as supervising physician I was immediately available for consultation/collaboration.    Nelia Shi, MD 01/26/12 1335

## 2012-01-27 LAB — GC/CHLAMYDIA PROBE AMP, GENITAL: Chlamydia, DNA Probe: NEGATIVE

## 2012-03-10 ENCOUNTER — Emergency Department (HOSPITAL_BASED_OUTPATIENT_CLINIC_OR_DEPARTMENT_OTHER): Payer: Self-pay

## 2012-03-10 ENCOUNTER — Encounter (HOSPITAL_BASED_OUTPATIENT_CLINIC_OR_DEPARTMENT_OTHER): Payer: Self-pay | Admitting: Family Medicine

## 2012-03-10 ENCOUNTER — Emergency Department (HOSPITAL_BASED_OUTPATIENT_CLINIC_OR_DEPARTMENT_OTHER)
Admission: EM | Admit: 2012-03-10 | Discharge: 2012-03-10 | Disposition: A | Discharge status: Home / Self Care | Payer: Self-pay | Attending: Emergency Medicine | Admitting: Emergency Medicine

## 2012-03-10 DIAGNOSIS — Z3202 Encounter for pregnancy test, result negative: Secondary | ICD-10-CM | POA: Insufficient documentation

## 2012-03-10 DIAGNOSIS — F17200 Nicotine dependence, unspecified, uncomplicated: Secondary | ICD-10-CM | POA: Insufficient documentation

## 2012-03-10 DIAGNOSIS — B349 Viral infection, unspecified: Secondary | ICD-10-CM

## 2012-03-10 DIAGNOSIS — R05 Cough: Secondary | ICD-10-CM | POA: Insufficient documentation

## 2012-03-10 DIAGNOSIS — IMO0001 Reserved for inherently not codable concepts without codable children: Secondary | ICD-10-CM | POA: Insufficient documentation

## 2012-03-10 DIAGNOSIS — R059 Cough, unspecified: Secondary | ICD-10-CM | POA: Insufficient documentation

## 2012-03-10 DIAGNOSIS — Z79899 Other long term (current) drug therapy: Secondary | ICD-10-CM | POA: Insufficient documentation

## 2012-03-10 DIAGNOSIS — B338 Other specified viral diseases: Secondary | ICD-10-CM | POA: Insufficient documentation

## 2012-03-10 DIAGNOSIS — R509 Fever, unspecified: Secondary | ICD-10-CM | POA: Insufficient documentation

## 2012-03-10 DIAGNOSIS — R112 Nausea with vomiting, unspecified: Secondary | ICD-10-CM | POA: Insufficient documentation

## 2012-03-10 LAB — URINALYSIS, ROUTINE W REFLEX MICROSCOPIC
Glucose, UA: NEGATIVE mg/dL
Leukocytes, UA: NEGATIVE
Nitrite: NEGATIVE
Protein, ur: NEGATIVE mg/dL
Urobilinogen, UA: 1 mg/dL (ref 0.0–1.0)

## 2012-03-10 LAB — PREGNANCY, URINE: Preg Test, Ur: NEGATIVE

## 2012-03-10 MED ORDER — ONDANSETRON 4 MG PO TBDP: 4.0000 mg | ORAL_TABLET | Freq: Three times a day (TID) | ORAL | Status: DC | PRN

## 2012-03-10 MED ORDER — IBUPROFEN 800 MG PO TABS
800.0000 mg | ORAL_TABLET | Freq: Once | ORAL | Status: AC
  Administered 2012-03-10: 800 mg via ORAL
  Filled 2012-03-10: qty 1

## 2012-03-10 MED ORDER — ONDANSETRON 4 MG PO TBDP
4.0000 mg | ORAL_TABLET | Freq: Once | ORAL | Status: AC
  Administered 2012-03-10: 4 mg via ORAL
  Filled 2012-03-10: qty 1

## 2012-03-10 NOTE — ED Notes (Signed)
Pt c/o fever and chills, n/v since yesterday. Denies diarrhea.

## 2012-03-10 NOTE — ED Provider Notes (Signed)
History     CSN: [id number]  Arrival date & time 03/10/12  1618   First MD Initiated Contact with Patient 03/10/12 1632      Chief Complaint  Patient presents with  . Fever    (Consider location/radiation/quality/duration/timing/severity/associated sxs/prior treatment) Patient is a 36 y.o. female presenting with fever. The history is provided by the patient. No language interpreter was used.  Fever Primary symptoms of the febrile illness include fever, cough, nausea, vomiting and myalgias. The current episode started yesterday. This is a new problem. The problem has not changed since onset.   History reviewed. No pertinent past medical history.  Past Surgical History  Procedure Date  . Tubal ligation   . Hernia repair   . Cesarean section   . Cesarean section     No family history on file.  History  Substance Use Topics  . Smoking status: Current Every Day Smoker  . Smokeless tobacco: Not on file  . Alcohol Use: Yes     Comment: social    OB History    Grav Para Term Preterm Abortions TAB SAB Ect Mult Living                  Review of Systems  Constitutional: Positive for fever.  Respiratory: Positive for cough.   Cardiovascular: Negative.   Gastrointestinal: Positive for nausea and vomiting.  Musculoskeletal: Positive for myalgias.    Allergies  Review of patient's allergies indicates no known allergies.  Home Medications   Current Outpatient Rx  Name  Route  Sig  Dispense  Refill  . CYCLOBENZAPRINE HCL 5 MG PO TABS   Oral   Take 5 mg by mouth 3 (three) times daily as needed. Patient used this medication for her shoulder pain.         . IBUPROFEN 200 MG PO TABS   Oral   Take 200 mg by mouth every 6 (six) hours as needed. Patient used this medication for cramps.         Marland Kitchen METRONIDAZOLE 500 MG PO TABS   Oral   Take 1 tablet (500 mg total) by mouth 2 (two) times daily. One po bid x 7 days   14 tablet   0     BP 98/60  Pulse 130  Temp  103.2 F (39.6 C) (Oral)  Resp 20  Ht 5\' 2"  (1.575 m)  Wt 154 lb (69.854 kg)  BMI 28.17 kg/m2  SpO2 98%  LMP 03/07/2012  Physical Exam  Nursing note and vitals reviewed. Constitutional: She is oriented to person, place, and time. She appears well-developed and well-nourished.  HENT:  Head: Normocephalic and atraumatic.  Right Ear: External ear normal.  Left Ear: External ear normal.  Eyes: Pupils are equal, round, and reactive to light.  Neck: Normal range of motion. Neck supple.  Cardiovascular: Normal rate and regular rhythm.   Pulmonary/Chest: Effort normal and breath sounds normal.  Abdominal: Soft. Bowel sounds are normal. There is no tenderness.  Musculoskeletal: Normal range of motion.  Neurological: She is alert and oriented to person, place, and time.  Skin: Skin is dry.  Psychiatric: She has a normal mood and affect.    ED Course  Procedures (including critical care time)  Labs Reviewed  URINALYSIS, ROUTINE W REFLEX MICROSCOPIC - Abnormal; Notable for the following:    Hgb urine dipstick LARGE (*)     All other components within normal limits  PREGNANCY, URINE  URINE MICROSCOPIC-ADD ON   Dg Chest 2  View  03/10/2012  *RADIOLOGY REPORT*  Clinical Data: Cough, congestion, fever, body aches, chills, smoker  CHEST - 2 VIEW  Comparison: 02/29/2008  Findings: Upper-normal size of cardiac silhouette. Mediastinal contours and pulmonary vascularity normal. Minimal peribronchial thickening and left basilar atelectasis. Lungs otherwise clear. No pleural effusion or pneumothorax. Bones unremarkable.  IMPRESSION: Minimal bronchitic changes and left basilar atelectasis.   Original Report Authenticated By: Ulyses Southward, M.D.      1. Fever   2. Viral illness       MDM  Likely flu like illness:pt is feeling better at this time:pt is tolerating po:pt is okay to go home with something for nausea        Teressa Lower, NP 03/10/12 1800

## 2012-03-10 NOTE — ED Provider Notes (Signed)
Medical screening examination/treatment/procedure(s) were performed by non-physician practitioner and as supervising physician I was immediately available for consultation/collaboration.  Londyn Hotard, MD 03/10/12 2322 

## 2012-07-22 ENCOUNTER — Encounter (HOSPITAL_BASED_OUTPATIENT_CLINIC_OR_DEPARTMENT_OTHER): Payer: Self-pay

## 2012-07-22 ENCOUNTER — Emergency Department (HOSPITAL_BASED_OUTPATIENT_CLINIC_OR_DEPARTMENT_OTHER)
Admission: EM | Admit: 2012-07-22 | Discharge: 2012-07-22 | Disposition: A | Discharge status: Home / Self Care | Payer: Self-pay | Attending: Emergency Medicine | Admitting: Emergency Medicine

## 2012-07-22 DIAGNOSIS — N898 Other specified noninflammatory disorders of vagina: Secondary | ICD-10-CM | POA: Insufficient documentation

## 2012-07-22 DIAGNOSIS — R3915 Urgency of urination: Secondary | ICD-10-CM | POA: Insufficient documentation

## 2012-07-22 DIAGNOSIS — Z3202 Encounter for pregnancy test, result negative: Secondary | ICD-10-CM | POA: Insufficient documentation

## 2012-07-22 DIAGNOSIS — Z79899 Other long term (current) drug therapy: Secondary | ICD-10-CM | POA: Insufficient documentation

## 2012-07-22 DIAGNOSIS — N390 Urinary tract infection, site not specified: Secondary | ICD-10-CM | POA: Insufficient documentation

## 2012-07-22 DIAGNOSIS — F17200 Nicotine dependence, unspecified, uncomplicated: Secondary | ICD-10-CM | POA: Insufficient documentation

## 2012-07-22 DIAGNOSIS — R350 Frequency of micturition: Secondary | ICD-10-CM | POA: Insufficient documentation

## 2012-07-22 DIAGNOSIS — L293 Anogenital pruritus, unspecified: Secondary | ICD-10-CM | POA: Insufficient documentation

## 2012-07-22 DIAGNOSIS — R300 Dysuria: Secondary | ICD-10-CM | POA: Insufficient documentation

## 2012-07-22 LAB — URINALYSIS, ROUTINE W REFLEX MICROSCOPIC
Bilirubin Urine: NEGATIVE
Hgb urine dipstick: NEGATIVE
Protein, ur: NEGATIVE mg/dL
Urobilinogen, UA: 1 mg/dL (ref 0.0–1.0)

## 2012-07-22 LAB — URINE MICROSCOPIC-ADD ON

## 2012-07-22 LAB — PREGNANCY, URINE: Preg Test, Ur: NEGATIVE

## 2012-07-22 MED ORDER — PHENAZOPYRIDINE HCL 200 MG PO TABS: 200.0000 mg | ORAL_TABLET | Freq: Three times a day (TID) | ORAL | Status: DC

## 2012-07-22 MED ORDER — CEPHALEXIN 500 MG PO CAPS: 500.0000 mg | ORAL_CAPSULE | Freq: Four times a day (QID) | ORAL | Status: DC

## 2012-07-22 MED ORDER — FLUCONAZOLE 150 MG PO TABS: 150.0000 mg | ORAL_TABLET | Freq: Every day | ORAL | Status: AC

## 2012-07-22 NOTE — ED Provider Notes (Signed)
History     CSN: [id number]  Arrival date & time 07/22/12  1137   First MD Initiated Contact with Patient 07/22/12 1159      Chief Complaint  Patient presents with  . Urinary Frequency  . Vaginal Itching    (Consider location/radiation/quality/duration/timing/severity/associated sxs/prior treatment) HPI Comments: Patient presents with a two-week history of urinary pressure, pain with voiding, frequency and vaginal itching. Been using topical creams for used without success. She is to void frequently in small amounts. Denies abdominal pain, nausea vomiting or fever. Denies any back pain. No change in bowel habits. No vaginal bleeding. Scant grayish vaginal discharge.  The history is provided by the patient.    History reviewed. No pertinent past medical history.  Past Surgical History  Procedure Laterality Date  . Tubal ligation    . Hernia repair    . Cesarean section    . Cesarean section      No family history on file.  History  Substance Use Topics  . Smoking status: Current Every Day Smoker  . Smokeless tobacco: Not on file  . Alcohol Use: Yes     Comment: social    OB History   Grav Para Term Preterm Abortions TAB SAB Ect Mult Living                  Review of Systems  Constitutional: Negative for fever, activity change and appetite change.  HENT: Negative for congestion and rhinorrhea.   Respiratory: Negative for cough, chest tightness and shortness of breath.   Cardiovascular: Negative for chest pain.  Gastrointestinal: Negative for nausea, vomiting and abdominal pain.  Genitourinary: Positive for dysuria, urgency, frequency and vaginal discharge. Negative for hematuria and difficulty urinating.  Musculoskeletal: Negative for back pain.  Skin: Negative for wound.  Neurological: Negative for dizziness, weakness and headaches.  A complete 10 system review of systems was obtained and all systems are negative except as noted in the HPI and PMH.     Allergies  Latex  Home Medications   Current Outpatient Rx  Name  Route  Sig  Dispense  Refill  . Multiple Vitamins-Minerals (HAIR VITAMINS PO)   Oral   Take by mouth.         . Phenazopyridine HCl (AZO TABS PO)   Oral   Take by mouth.         . cephALEXin (KEFLEX) 500 MG capsule   Oral   Take 1 capsule (500 mg total) by mouth 4 (four) times daily.   40 capsule   0   . fluconazole (DIFLUCAN) 150 MG tablet   Oral   Take 1 tablet (150 mg total) by mouth daily.   1 tablet   0   . phenazopyridine (PYRIDIUM) 200 MG tablet   Oral   Take 1 tablet (200 mg total) by mouth 3 (three) times daily.   6 tablet   0     BP 107/77  Pulse 102  Temp(Src) 98.4 F (36.9 C) (Oral)  Resp 16  Ht 5\' 2"  (1.575 m)  Wt 153 lb (69.4 kg)  BMI 27.98 kg/m2  SpO2 100%  LMP 07/19/2012  Physical Exam  Constitutional: She is oriented to person, place, and time. She appears well-developed and well-nourished. No distress.  HENT:  Head: Normocephalic and atraumatic.  Mouth/Throat: Oropharynx is clear and moist. No oropharyngeal exudate.  Eyes: Conjunctivae and EOM are normal. Pupils are equal, round, and reactive to light.  Neck: Normal range of motion. Neck supple.  Cardiovascular: Normal rate, regular rhythm and normal heart sounds.   No murmur heard. Pulmonary/Chest: Effort normal and breath sounds normal. No respiratory distress.  Abdominal: Soft. There is no tenderness. There is no rebound and no guarding.  Genitourinary: There is no tenderness on the right labia. There is no tenderness on the left labia. Cervix exhibits discharge. Cervix exhibits no motion tenderness. Right adnexum displays no mass and no tenderness. Left adnexum displays no mass and no tenderness. Vaginal discharge found.  Musculoskeletal: Normal range of motion. She exhibits no edema and no tenderness.  No CVA tenderness  Neurological: She is alert and oriented to person, place, and time. No cranial nerve  deficit. She exhibits normal muscle tone. Coordination normal.  Skin: Skin is warm.    ED Course  Procedures (including critical care time)  Labs Reviewed  WET PREP, GENITAL - Abnormal; Notable for the following:    WBC, Wet Prep HPF POC RARE (*)    All other components within normal limits  URINALYSIS, ROUTINE W REFLEX MICROSCOPIC - Abnormal; Notable for the following:    Color, Urine AMBER (*)    APPearance CLOUDY (*)    Leukocytes, UA MODERATE (*)    All other components within normal limits  URINE MICROSCOPIC-ADD ON - Abnormal; Notable for the following:    Squamous Epithelial / LPF FEW (*)    Bacteria, UA MANY (*)    All other components within normal limits  GC/CHLAMYDIA PROBE AMP  URINE CULTURE  PREGNANCY, URINE   No results found.   1. Urinary tract infection       MDM  2 weeks of urinary symptoms of vaginal itching. Abdomen soft and nontender.  UA positive for infection. Culture sent. Pelvic Exam benign. Patient given Diflucan at her request.     Glynn Octave, MD 07/22/12 [phone]

## 2012-07-22 NOTE — ED Notes (Signed)
Pt reports onset of urinary pressure with voiding, urinary frequency and vaginal itching unrelieved after using a topical vaginal cream for yeast and OTC AZO.

## 2012-07-23 LAB — GC/CHLAMYDIA PROBE AMP
CT Probe RNA: NEGATIVE
GC Probe RNA: NEGATIVE

## 2012-07-24 LAB — URINE CULTURE: Colony Count: >=100000

## 2012-07-25 ENCOUNTER — Telehealth (HOSPITAL_COMMUNITY): Payer: Self-pay | Admitting: Emergency Medicine

## 2012-07-25 NOTE — ED Notes (Signed)
Patient has +Urine culture. Checking to see if treated appropriately. °

## 2012-07-25 NOTE — ED Notes (Signed)
+  Urine. Patient given Keflex. Intermediate. Chart sent to EDP office for review.

## 2012-07-26 NOTE — ED Notes (Signed)
If patient without sx no further treatment otherwise Macrobid 50 mg QID x 7 days # 20 per [facility]

## 2012-07-28 ENCOUNTER — Telehealth (HOSPITAL_COMMUNITY): Payer: Self-pay | Admitting: Emergency Medicine

## 2012-07-28 NOTE — ED Notes (Signed)
Pt informed of Dx and need for addl tx if symptomatic.  Rx for Macrodantin called to [facility] [phone].

## 2012-07-29 ENCOUNTER — Telehealth (HOSPITAL_COMMUNITY): Payer: Self-pay | Admitting: Emergency Medicine

## 2012-07-29 NOTE — ED Notes (Signed)
Medication too expensive. Forwarding information to case Production designer, theatre/television/film at [employer].

## 2012-07-29 NOTE — Progress Notes (Signed)
[name] consulted by [name] manager to assist with [facility] assistance for [place].  Pt found cost a [facility] as $45.  Cm reviewed EPIC chart notes and PDMI name inquiry Pt is eligible for [facility] MATCH assistance   CM noted [facility] contact number as [phone] in EPIC Cm spoke with Lanora Manis at [address] [place] to obtain fax number of [phone] Pt entered in PDMI, MATCH letter completed and faxed to [facility] Harrell Gave -pharmacist) with Confirmation received at 1505  CM called pt at [phone] who confirms self pay [place]uel[place] resident with no pcp. CM discussed and provided information for self pay pcps (family medicine at [address], [facility], [facility] family practice, general medical on [place] and [address], [facility] and [facility], importance of pcp for f/u care, differences in emergency (crisis) and community medical care levels, discounted pharmacies, MATCH program ($3 co-pay at pharmacy of choice) and other [place] resources such as financial assistance, DSS and  health department. Reviewed affordable care act/health reform (deadline 08/02/12) and other resources for prevention of yeast infection (pt reports she gets yeast infections with antibiotics and cream yeast infection medications does not work). Pt encouraged to consult [facility] pharmacist for OTC medicines to assist with yeast infections Pt encouraged to seek assistance from self pay pcps provided Pt voiced understanding and appreciation of resources provided  Pt states she previously had medicaid and has not been to a doctor in 3 years.  Wants to re apply for medicaid Reports she will attempt to sign up for affordable care act [facility] coverage

## 2012-07-29 NOTE — Progress Notes (Signed)
  MATCH Medication Assistance Card Name: Chasady, [place] (MRN): [id number] Bin: [id number] RX Group: [id number] PCN: [id number]  Discharge Date: 07/29/2012  Expiration Date04//07/2012     Dear __________Aretha P Floyd___________:  Bonita Quin have been approved to have your discharge prescriptions filled through our [facility] (Medication Assistance Through [facility]) program. This program allows for a one-time (no refills) 34-day supply of selected medications for a low copay amount.  The copay is $3.00 per prescription. For instance, if you have one prescription, you will pay $3.00; for two prescriptions, you pay $6.00; for three prescriptions, you pay $9.00; and so on.  Only certain pharmacies are participating in this program with [facility]. You will need to select one of the pharmacies from the attached list and take your prescriptions, this letter, and your photo ID to one of the participating pharmacies.   We are excited that you are able to use the [facility] program to get your medications. These prescriptions must be filled within 7 days of hospital discharge or they will no longer be valid for the [facility] program. Should you have any problems with your prescriptions please contact your case management team member at [phone].  Thank you,   [employer] [facility]   Participating [facility]acies  [facility]   [facility] [address] [place], [place]   [facility] [address] [place], [place]   [facility] [address] [place], [place]   [facility]   [address], [place], [place]   [address], [place], [place]   [address] [place], [place]   [address], [place], [place]   [address] [place], [place]   [address], [place], [place]   [address], [place], [place]   [address], [place], [place]   [address], [place], [place]   [address] [place], [place], [place]  [facility]   [address] [place], [place]   [address] [place], [place]   [address], [place], [place]   [address], [place], [place]   [address] [place], [place]   [address] [place] [address] [place], [place], [place] [facility]   [address] [place], [place]   [address], [place], [place]   [address], [place], [place]   [address], [place], [place]   [address], [place], [place]   [address] [place], [place]   [address] [place], [place]   [address], [place], [place]   [address], [place], [place]   [address], [place], [place]   [address], [place], [place]   [address], [place], [place]   [address] [place], [place]   [address] [place], [place], [place]  Independent Pharmacies   [facility] [address] [place], [address] [place], [place]   [facility] [address] [place], [place]   [facility] [address] [place], [place]   For continued medication needs, please contact the [facility] Department at  [phone].

## 2013-02-03 ENCOUNTER — Encounter (HOSPITAL_BASED_OUTPATIENT_CLINIC_OR_DEPARTMENT_OTHER): Payer: Self-pay | Admitting: *Deleted

## 2013-02-03 ENCOUNTER — Emergency Department (HOSPITAL_BASED_OUTPATIENT_CLINIC_OR_DEPARTMENT_OTHER)
Admission: EM | Admit: 2013-02-03 | Discharge: 2013-02-03 | Disposition: A | Discharge status: Home / Self Care | Payer: Self-pay | Attending: Emergency Medicine | Admitting: Emergency Medicine

## 2013-02-03 DIAGNOSIS — F17200 Nicotine dependence, unspecified, uncomplicated: Secondary | ICD-10-CM | POA: Insufficient documentation

## 2013-02-03 DIAGNOSIS — Z792 Long term (current) use of antibiotics: Secondary | ICD-10-CM | POA: Insufficient documentation

## 2013-02-03 DIAGNOSIS — Z91040 Latex allergy status: Secondary | ICD-10-CM | POA: Insufficient documentation

## 2013-02-03 DIAGNOSIS — Z79899 Other long term (current) drug therapy: Secondary | ICD-10-CM | POA: Insufficient documentation

## 2013-02-03 DIAGNOSIS — K130 Diseases of lips: Secondary | ICD-10-CM

## 2013-02-03 NOTE — ED Provider Notes (Signed)
CSN: [id number]     Arrival date & time 02/03/13  1916 History   First MD Initiated Contact with Patient 02/03/13 1931     Chief Complaint  Patient presents with  . Oral Swelling   (Consider location/radiation/quality/duration/timing/severity/associated sxs/prior Treatment) HPI Comments: Pt states that she had dried cracked area to her lips and every times she opens her mouth it cracks again:pt state that she has not had any drainage:denies any lesions in mouth:denies fever  The history is provided by the patient. No language interpreter was used.    History reviewed. No pertinent past medical history. Past Surgical History  Procedure Laterality Date  . Tubal ligation    . Hernia repair    . Cesarean section    . Cesarean section     History reviewed. No pertinent family history. History  Substance Use Topics  . Smoking status: Current Every Day Smoker  . Smokeless tobacco: Not on file  . Alcohol Use: Yes     Comment: social   OB History   Grav Para Term Preterm Abortions TAB SAB Ect Mult Living                 Review of Systems  Constitutional: Negative.   Respiratory: Negative.   Cardiovascular: Negative.     Allergies  Latex  Home Medications   Current Outpatient Rx  Name  Route  Sig  Dispense  Refill  . valACYclovir (VALTREX) 500 MG tablet   Oral   Take 500 mg by mouth 2 (two) times daily.         . cephALEXin (KEFLEX) 500 MG capsule   Oral   Take 1 capsule (500 mg total) by mouth 4 (four) times daily.   40 capsule   0   . Multiple Vitamins-Minerals (HAIR VITAMINS PO)   Oral   Take by mouth.         . Phenazopyridine HCl (AZO TABS PO)   Oral   Take by mouth.          BP 106/70  Pulse 67  Temp(Src) 98.8 F (37.1 C) (Oral)  Resp 16  Ht 5\' 3"  (1.6 m)  Wt 153 lb (69.4 kg)  BMI 27.11 kg/m2  LMP 01/06/2013 Physical Exam  Nursing note and vitals reviewed. Constitutional: She appears well-developed and well-nourished.  HENT:  Head:  Normocephalic and atraumatic.  Pt has dried cracked area to the corner of mouth:no ulceration or swelling noted  Cardiovascular: Normal rate and regular rhythm.   Pulmonary/Chest: Effort normal and breath sounds normal.    ED Course  Procedures (including critical care time) Labs Review Labs Reviewed - No data to display Imaging Review No results found.  MDM   1. Dry lips    No herpetic type lesions noted    Teressa Lower, NP 02/03/13 1943

## 2013-02-03 NOTE — ED Provider Notes (Signed)
  Medical screening examination/treatment/procedure(s) were performed by non-physician practitioner and as supervising physician I was immediately available for consultation/collaboration.   Jeanet Lupe, MD 02/03/13 2319 

## 2013-02-03 NOTE — ED Notes (Signed)
Pt c/o lip swelling and sores x 1 week

## 2013-08-10 ENCOUNTER — Ambulatory Visit: Payer: Self-pay | Admitting: Obstetrics

## 2013-08-11 ENCOUNTER — Ambulatory Visit: Payer: Self-pay | Admitting: Obstetrics

## 2013-08-22 ENCOUNTER — Emergency Department (HOSPITAL_BASED_OUTPATIENT_CLINIC_OR_DEPARTMENT_OTHER)
Admission: EM | Admit: 2013-08-22 | Discharge: 2013-08-22 | Disposition: A | Discharge status: Home / Self Care | Payer: BC Managed Care – PPO | Attending: Emergency Medicine | Admitting: Emergency Medicine

## 2013-08-22 ENCOUNTER — Encounter (HOSPITAL_BASED_OUTPATIENT_CLINIC_OR_DEPARTMENT_OTHER): Payer: Self-pay | Admitting: Emergency Medicine

## 2013-08-22 DIAGNOSIS — Z792 Long term (current) use of antibiotics: Secondary | ICD-10-CM | POA: Insufficient documentation

## 2013-08-22 DIAGNOSIS — Z91040 Latex allergy status: Secondary | ICD-10-CM | POA: Insufficient documentation

## 2013-08-22 DIAGNOSIS — Z79899 Other long term (current) drug therapy: Secondary | ICD-10-CM | POA: Insufficient documentation

## 2013-08-22 DIAGNOSIS — K002 Abnormalities of size and form of teeth: Secondary | ICD-10-CM | POA: Insufficient documentation

## 2013-08-22 DIAGNOSIS — F17200 Nicotine dependence, unspecified, uncomplicated: Secondary | ICD-10-CM | POA: Insufficient documentation

## 2013-08-22 DIAGNOSIS — K089 Disorder of teeth and supporting structures, unspecified: Secondary | ICD-10-CM | POA: Insufficient documentation

## 2013-08-22 DIAGNOSIS — K0889 Other specified disorders of teeth and supporting structures: Secondary | ICD-10-CM

## 2013-08-22 DIAGNOSIS — K029 Dental caries, unspecified: Secondary | ICD-10-CM | POA: Insufficient documentation

## 2013-08-22 MED ORDER — ACETAMINOPHEN-CODEINE #3 300-30 MG PO TABS: 1.0000 | ORAL_TABLET | Freq: Four times a day (QID) | ORAL | Status: DC | PRN

## 2013-08-22 MED ORDER — LIDOCAINE VISCOUS 2 % MT SOLN
15.0000 mL | Freq: Once | OROMUCOSAL | Status: AC
  Administered 2013-08-22: 15 mL via OROMUCOSAL
  Filled 2013-08-22: qty 15

## 2013-08-22 MED ORDER — PENICILLIN V POTASSIUM 500 MG PO TABS: 500.0000 mg | ORAL_TABLET | Freq: Four times a day (QID) | ORAL | Status: DC

## 2013-08-22 MED ORDER — LIDOCAINE VISCOUS 2 % MT SOLN: 20.0000 mL | OROMUCOSAL | Status: DC | PRN

## 2013-08-22 MED ORDER — IBUPROFEN 800 MG PO TABS: 800.0000 mg | ORAL_TABLET | Freq: Three times a day (TID) | ORAL | Status: DC

## 2013-08-22 NOTE — ED Provider Notes (Signed)
CSN: [id number]     Arrival date & time 08/22/13  1024 History   First MD Initiated Contact with Patient 08/22/13 1036     Chief Complaint  Patient presents with  . Dental Pain     (Consider location/radiation/quality/duration/timing/severity/associated sxs/prior Treatment) HPI Comments: Patient is a 38 year old female past medical history significant for tobacco use presented to the emergency department for left-sided upper and lower dental pain that began 2 weeks ago. Patient states it was initially mild throbbing pain without radiation that had been intermittent and relieved by ibuprofen, but she states the pain is now constant and not relieved by the Motrin. She denies any fevers, purulent drainage from the tooth, difficulty swallowing or facial swelling. Patient states he does have a dentist at his office is not open today.  Patient is a 38 y.o. female presenting with tooth pain.  Dental Pain Associated symptoms: no fever     History reviewed. No pertinent past medical history. Past Surgical History  Procedure Laterality Date  . Tubal ligation    . Hernia repair    . Cesarean section    . Cesarean section     No family history on file. History  Substance Use Topics  . Smoking status: Current Every Day Smoker  . Smokeless tobacco: Not on file  . Alcohol Use: Yes     Comment: social   OB History   Grav Para Term Preterm Abortions TAB SAB Ect Mult Living                 Review of Systems  Constitutional: Negative for fever and chills.  HENT: Positive for dental problem.   All other systems reviewed and are negative.     Allergies  Latex  Home Medications   Prior to Admission medications   Medication Sig Start Date End Date Taking? Authorizing Provider  cephALEXin (KEFLEX) 500 MG capsule Take 1 capsule (500 mg total) by mouth 4 (four) times daily. 07/22/12   Ezequiel Essex, MD  Multiple Vitamins-Minerals (HAIR VITAMINS PO) Take by mouth.    Historical Provider,  MD  Phenazopyridine HCl (AZO TABS PO) Take by mouth.    Historical Provider, MD  valACYclovir (VALTREX) 500 MG tablet Take 500 mg by mouth 2 (two) times daily.    Historical Provider, MD   BP 107/75  Pulse 91  Temp(Src) 98.4 F (36.9 C) (Oral)  Resp 16  Ht 5\' 2"  (1.575 m)  Wt 152 lb (68.947 kg)  BMI 27.79 kg/m2  SpO2 100%  LMP 08/08/2013 Physical Exam  Nursing note and vitals reviewed. Constitutional: She is oriented to person, place, and time. She appears well-developed and well-nourished. No distress.  HENT:  Head: Normocephalic and atraumatic.  Right Ear: External ear normal.  Left Ear: External ear normal.  Nose: Nose normal.  Mouth/Throat: Uvula is midline, oropharynx is clear and moist and mucous membranes are normal. No oral lesions. No trismus in the jaw. Abnormal dentition. Dental caries present. No dental abscesses or uvula swelling. No tonsillar abscesses.    Affected teeth noted. No obvious abnormality to teeth or surrounding gum line, only TTP.   Eyes: Conjunctivae are normal.  Neck: Normal range of motion. Neck supple.  Cardiovascular: Normal rate, regular rhythm and normal heart sounds.   Pulmonary/Chest: Effort normal and breath sounds normal. No respiratory distress.  Neurological: She is alert and oriented to person, place, and time.  Skin: Skin is warm and dry. She is not diaphoretic.  Psychiatric: She has a normal  mood and affect.    ED Course  Procedures (including critical care  Medications  lidocaine (XYLOCAINE) 2 % viscous mouth solution 15 mL (15 mLs Mouth/Throat Given 08/22/13 1113)    Labs Review Labs Reviewed - No data to display  Imaging Review No results found.   EKG Interpretation None      MDM   Final diagnoses:  Pain, dental    Filed Vitals:   08/22/13 1032  BP: 107/75  Pulse: 91  Temp: 98.4 F (36.9 C)  Resp: 16   Afebrile, NAD, non-toxic appearing, AAOx4.  Patient with toothache.  No gross abscess.  Exam unconcerning  for Ludwig's angina or spread of infection.  Will treat with penicillin and pain medicine.  Urged patient to follow-up with her dentist. Return precautions discussed. Patient agreeable to plan. Patient stable time of discharge.      Harlow Mares, PA-C 08/22/13 1419

## 2013-08-22 NOTE — ED Notes (Signed)
Pt c/o upper and lower left mouth pain that started 2 weeks ago. Pain has been intermittent and relieved by ibuprofen, but now pain is constant and not relieved.

## 2013-08-22 NOTE — Discharge Instructions (Signed)
Please follow up with your primary care physician in 1-2 days. If you do not have one please call the [place] number listed above. Please follow up with your dentist to schedule a follow up appointment. Please take your antibiotic until completion. Please take pain medication and/or muscle relaxants as prescribed and as needed for pain. Please do not drive on narcotic pain medication or on muscle relaxants. Please read all discharge instructions and return precautions.    Dental Pain A tooth ache may be caused by cavities (tooth decay). Cavities expose the nerve of the tooth to air and hot or cold temperatures. It may come from an infection or abscess (also called a boil or furuncle) around your tooth. It is also often caused by dental caries (tooth decay). This causes the pain you are having. DIAGNOSIS  Your caregiver can diagnose this problem by exam. TREATMENT   If caused by an infection, it may be treated with medications which kill germs (antibiotics) and pain medications as prescribed by your caregiver. Take medications as directed.  Only take over-the-counter or prescription medicines for pain, discomfort, or fever as directed by your caregiver.  Whether the tooth ache today is caused by infection or dental disease, you should see your dentist as soon as possible for further care. SEEK MEDICAL CARE IF: The exam and treatment you received today has been provided on an emergency basis only. This is not a substitute for complete medical or dental care. If your problem worsens or new problems (symptoms) appear, and you are unable to meet with your dentist, call or return to this location. SEEK IMMEDIATE MEDICAL CARE IF:   You have a fever.  You develop redness and swelling of your face, jaw, or neck.  You are unable to open your mouth.  You have severe pain uncontrolled by pain medicine. MAKE SURE YOU:   Understand these instructions.  Will watch your  condition.  Will get help right away if you are not doing well or get worse. Document Released: 04/21/2005 Document Revised: 07/14/2011 Document Reviewed: 12/08/2007 [facility] Patient Information 2014 [employer].

## 2013-08-24 NOTE — ED Provider Notes (Signed)
Medical screening examination/treatment/procedure(s) were performed by non-physician practitioner and as supervising physician I was immediately available for consultation/collaboration.   EKG Interpretation None        Christina B. Karle Starch, MD 08/24/13 1353

## 2013-08-25 ENCOUNTER — Ambulatory Visit (INDEPENDENT_AMBULATORY_CARE_PROVIDER_SITE_OTHER): Payer: BC Managed Care – PPO | Admitting: Obstetrics

## 2013-08-25 ENCOUNTER — Encounter: Payer: Self-pay | Admitting: Obstetrics

## 2013-08-25 VITALS — BP 112/78 | HR 86 | Temp 98.3°F | Wt 156.0 lb

## 2013-08-25 DIAGNOSIS — Z01419 Encounter for gynecological examination (general) (routine) without abnormal findings: Secondary | ICD-10-CM

## 2013-08-25 DIAGNOSIS — N946 Dysmenorrhea, unspecified: Secondary | ICD-10-CM

## 2013-08-25 DIAGNOSIS — D259 Leiomyoma of uterus, unspecified: Secondary | ICD-10-CM | POA: Insufficient documentation

## 2013-08-25 DIAGNOSIS — Z113 Encounter for screening for infections with a predominantly sexual mode of transmission: Secondary | ICD-10-CM

## 2013-08-25 DIAGNOSIS — A6004 Herpesviral vulvovaginitis: Secondary | ICD-10-CM | POA: Insufficient documentation

## 2013-08-25 DIAGNOSIS — N760 Acute vaginitis: Secondary | ICD-10-CM | POA: Insufficient documentation

## 2013-08-25 DIAGNOSIS — Z124 Encounter for screening for malignant neoplasm of cervix: Secondary | ICD-10-CM

## 2013-08-25 LAB — POCT URINALYSIS DIPSTICK
BILIRUBIN UA: NEGATIVE
GLUCOSE UA: NEGATIVE
KETONES UA: NEGATIVE
Leukocytes, UA: NEGATIVE
Nitrite, UA: NEGATIVE
PH UA: 6
RBC UA: NEGATIVE
Spec Grav, UA: 1.015
Urobilinogen, UA: NEGATIVE

## 2013-08-25 MED ORDER — IBUPROFEN 800 MG PO TABS: 800.0000 mg | ORAL_TABLET | Freq: Three times a day (TID) | ORAL | Status: DC | PRN

## 2013-08-25 MED ORDER — VALACYCLOVIR HCL 500 MG PO TABS: ORAL_TABLET | ORAL | Status: DC

## 2013-08-25 MED ORDER — FLUCONAZOLE 150 MG PO TABS: 150.0000 mg | ORAL_TABLET | Freq: Once | ORAL | Status: DC

## 2013-08-26 ENCOUNTER — Other Ambulatory Visit: Payer: Self-pay | Admitting: *Deleted

## 2013-08-26 ENCOUNTER — Encounter: Payer: Self-pay | Admitting: Obstetrics

## 2013-08-26 DIAGNOSIS — A749 Chlamydial infection, unspecified: Secondary | ICD-10-CM

## 2013-08-26 LAB — WET PREP BY MOLECULAR PROBE
CANDIDA SPECIES: NEGATIVE
Gardnerella vaginalis: NEGATIVE
TRICHOMONAS VAG: NEGATIVE

## 2013-08-26 LAB — GC/CHLAMYDIA PROBE AMP
CT Probe RNA: POSITIVE — AB
GC PROBE AMP APTIMA: NEGATIVE

## 2013-08-26 LAB — HIV ANTIBODY (ROUTINE TESTING W REFLEX): HIV: NONREACTIVE

## 2013-08-26 MED ORDER — AZITHROMYCIN 250 MG PO TABS: 250.0000 mg | ORAL_TABLET | Freq: Once | ORAL | Status: DC

## 2013-08-26 MED ORDER — CEFIXIME 400 MG PO TABS: 400.0000 mg | ORAL_TABLET | Freq: Every day | ORAL | Status: DC

## 2013-08-26 NOTE — Progress Notes (Signed)
Subjective:     Christina Valenzuela is a 38 y.o. female here for a routine exam.  Current complaints: Vaginal discharge and irritation.    Personal health questionnaire:  Is patient Ashkenazi Jewish, have a family history of breast and/or ovarian cancer: no Is there a family history of uterine cancer diagnosed at age < 91, gastrointestinal cancer, urinary tract cancer, family member who is a Field seismologist syndrome-associated carrier: no Is the patient overweight and hypertensive, family history of diabetes, personal history of gestational diabetes or PCOS: no Is patient over 60, have PCOS,  family history of premature CHD under age 2, diabetes, smoke, have hypertension or peripheral artery disease:  no  The HPI was reviewed and explored in further detail by the provider. Gynecologic History Patient's last menstrual period was 08/08/2013. Contraception: tubal ligation Last Pap: 2+ years ago. Results were: abnormal Last mammogram: none. Results were: none  Obstetric History OB History  No data available    History reviewed. No pertinent past medical history.  Past Surgical History  Procedure Laterality Date  . Tubal ligation    . Hernia repair    . Cesarean section    . Cesarean section      Current outpatient prescriptions:acetaminophen-codeine (TYLENOL #3) 300-30 MG per tablet, Take 1 tablet by mouth every 6 (six) hours as needed for severe pain., Disp: 15 tablet, Rfl: 0;  cephALEXin (KEFLEX) 500 MG capsule, Take 1 capsule (500 mg total) by mouth 4 (four) times daily., Disp: 40 capsule, Rfl: 0 ibuprofen (ADVIL,MOTRIN) 800 MG tablet, Take 1 tablet (800 mg total) by mouth every 8 (eight) hours as needed for mild pain or moderate pain., Disp: 30 tablet, Rfl: 11;  lidocaine (XYLOCAINE) 2 % solution, Use as directed 20 mLs in the mouth or throat as needed for mouth pain., Disp: 100 mL, Rfl: 0;  Multiple Vitamins-Minerals (HAIR VITAMINS PO), Take by mouth., Disp: , Rfl:  penicillin v potassium (VEETID)  500 MG tablet, Take 1 tablet (500 mg total) by mouth 4 (four) times daily., Disp: 40 tablet, Rfl: 0;  Phenazopyridine HCl (AZO TABS PO), Take by mouth., Disp: , Rfl: ;  valACYclovir (VALTREX) 500 MG tablet, Take 1 tablet po bid x 3 days prn., Disp: 30 tablet, Rfl: prn;  fluconazole (DIFLUCAN) 150 MG tablet, Take 1 tablet (150 mg total) by mouth once., Disp: 1 tablet, Rfl: 2 Allergies  Allergen Reactions  . Latex Swelling    Vaginal swelling    History  Substance Use Topics  . Smoking status: Current Every Day Smoker  . Smokeless tobacco: Not on file  . Alcohol Use: Yes     Comment: social    History reviewed. No pertinent family history.    Review of Systems  Constitutional: negative for fatigue and weight loss Respiratory: negative for cough and wheezing Cardiovascular: negative for chest pain, fatigue and palpitations Gastrointestinal: negative for abdominal pain and change in bowel habits Musculoskeletal:negative for myalgias Neurological: negative for gait problems and tremors Behavioral/Psych: negative for abusive relationship, depression Endocrine: negative for temperature intolerance   Genitourinary:negative for abnormal menstrual periods, genital lesions, hot flashes, sexual problems and vaginal discharge Integument/breast: negative for breast lump, breast tenderness, nipple discharge and skin lesion(s)    Objective:       General:   alert  Skin:   no rash or abnormalities  Lungs:   clear to auscultation bilaterally  Heart:   regular rate and rhythm, S1, S2 normal, no murmur, click, rub or gallop  Breasts:  normal without suspicious masses, skin or nipple changes or axillary nodes  Abdomen:  normal findings: no organomegaly, soft, non-tender and no hernia  Pelvis:  External genitalia: normal general appearance Urinary system: urethral meatus normal and bladder without fullness, nontender Vaginal: normal without tenderness, induration or masses Cervix: normal  appearance Adnexa: normal bimanual exam Uterus: anteverted and non-tender, normal size   Lab Review Urine pregnancy test Labs reviewed yes Radiologic studies reviewed no    Assessment:    Healthy female exam.   H/O Genital Herpes  Has probable yeast infection  H/O Dysmenorrhea   Plan:    Education reviewed: calcium supplements, low fat, low cholesterol diet, safe sex/STD prevention, self breast exams, smoking cessation and weight bearing exercise. Contraception: tubal ligation. Follow up in: 1 year. Will schedule mammogram screen at age 72.   Meds ordered this encounter  Medications  . valACYclovir (VALTREX) 500 MG tablet    Sig: Take 1 tablet po bid x 3 days prn.    Dispense:  30 tablet    Refill:  prn  . fluconazole (DIFLUCAN) 150 MG tablet    Sig: Take 1 tablet (150 mg total) by mouth once.    Dispense:  1 tablet    Refill:  2  . ibuprofen (ADVIL,MOTRIN) 800 MG tablet    Sig: Take 1 tablet (800 mg total) by mouth every 8 (eight) hours as needed for mild pain or moderate pain.    Dispense:  30 tablet    Refill:  11    Order Specific Question:  Supervising Provider    Answer:  Noemi Chapel D [[id number]]   Orders Placed This Encounter  Procedures  . WET PREP BY MOLECULAR PROBE  . GC/Chlamydia Probe Amp  . US Pelvis Complete    Standing Status: Future     Number of Occurrences:      Standing Expiration Date: 10/26/2014    Order Specific Question:  Reason for Exam (SYMPTOM  OR DIAGNOSIS REQUIRED)    Answer:  218.9    Order Specific Question:  Preferred imaging location?    Answer:  [facility]  . US Transvaginal Non-OB    Standing Status: Future     Number of Occurrences:      Standing Expiration Date: 10/26/2014    Order Specific Question:  Reason for Exam (SYMPTOM  OR DIAGNOSIS REQUIRED)    Answer:  218.9    Order Specific Question:  Preferred imaging location?    Answer:  [facility]  . HIV antibody  . POCT urinalysis dipstick   Need to obtain  previous records Possible management options include:Fluconazole for yeast and Ibuprofen prn for dysmenorrhea.  Valtrex Rx for Herpes prn. Follow up as needed. F/U 1 year.

## 2013-08-29 LAB — PAP IG W/ RFLX HPV ASCU

## 2013-08-30 ENCOUNTER — Other Ambulatory Visit: Payer: Self-pay | Admitting: Obstetrics

## 2013-08-30 DIAGNOSIS — N946 Dysmenorrhea, unspecified: Secondary | ICD-10-CM

## 2013-08-30 MED ORDER — IBUPROFEN 800 MG PO TABS: 800.0000 mg | ORAL_TABLET | Freq: Three times a day (TID) | ORAL | Status: DC | PRN

## 2013-08-31 ENCOUNTER — Other Ambulatory Visit: Payer: Self-pay | Admitting: Obstetrics

## 2013-08-31 DIAGNOSIS — N946 Dysmenorrhea, unspecified: Secondary | ICD-10-CM

## 2013-08-31 LAB — HUMAN PAPILLOMAVIRUS, HIGH RISK: HPV DNA High Risk: DETECTED — AB

## 2013-08-31 MED ORDER — IBUPROFEN 800 MG PO TABS: 800.0000 mg | ORAL_TABLET | Freq: Three times a day (TID) | ORAL | Status: DC | PRN

## 2013-09-01 ENCOUNTER — Encounter: Payer: Self-pay | Admitting: Obstetrics

## 2013-09-02 ENCOUNTER — Ambulatory Visit (HOSPITAL_COMMUNITY): Payer: BC Managed Care – PPO

## 2013-09-17 ENCOUNTER — Encounter (HOSPITAL_BASED_OUTPATIENT_CLINIC_OR_DEPARTMENT_OTHER): Payer: Self-pay | Admitting: Emergency Medicine

## 2013-09-17 ENCOUNTER — Emergency Department (HOSPITAL_BASED_OUTPATIENT_CLINIC_OR_DEPARTMENT_OTHER)
Admission: EM | Admit: 2013-09-17 | Discharge: 2013-09-17 | Disposition: A | Discharge status: Home / Self Care | Payer: BC Managed Care – PPO | Attending: Emergency Medicine | Admitting: Emergency Medicine

## 2013-09-17 DIAGNOSIS — Z792 Long term (current) use of antibiotics: Secondary | ICD-10-CM | POA: Insufficient documentation

## 2013-09-17 DIAGNOSIS — Z91040 Latex allergy status: Secondary | ICD-10-CM | POA: Insufficient documentation

## 2013-09-17 DIAGNOSIS — L03039 Cellulitis of unspecified toe: Secondary | ICD-10-CM | POA: Insufficient documentation

## 2013-09-17 DIAGNOSIS — F17200 Nicotine dependence, unspecified, uncomplicated: Secondary | ICD-10-CM | POA: Insufficient documentation

## 2013-09-17 DIAGNOSIS — L03032 Cellulitis of left toe: Secondary | ICD-10-CM

## 2013-09-17 NOTE — ED Provider Notes (Signed)
CSN: [id number]     Arrival date & time 09/17/13  1417 History  This chart was scribed for Christina Relic, MD by Marcha Dutton, ED Scribe. This patient was seen in room MH03/MH03 and the patient's care was started at [facility].    Chief Complaint  Patient presents with  . Toe Pain    The history is provided by the patient. No language interpreter was used.    HPI Comments: LEATHER ESTIS is a 38 y.o. female who presents to the Emergency Department complaining of left great toe pain after a pedicure a week ago. She states she has had four or five days of gradual onset, gradually worsening pain and tenderness lateral aspect of the left great toe nail area with some pus and drainage. She reports she was limping yesterday. Pt denies fever, vomiting, confusion, red streaks up her left leg, chest pain, and shortness of breath. Pt denies any h/o autoimmune disorders and CA. Pt unsure of her last Tetanus shot but believes it to have been within the last 10 years.   History reviewed. No pertinent past medical history. Past Surgical History  Procedure Laterality Date  . Tubal ligation    . Hernia repair    . Cesarean section    . Cesarean section      No family history on file. History  Substance Use Topics  . Smoking status: Current Every Day Smoker  . Smokeless tobacco: Not on file  . Alcohol Use: Yes     Comment: social    Review of Systems 10 Systems reviewed and all are negative for acute change except as noted in the HPI.   Allergies  Latex   Home Medications   Prior to Admission medications   Medication Sig Start Date End Date Taking? Authorizing Provider  acetaminophen-codeine (TYLENOL #3) 300-30 MG per tablet Take 1 tablet by mouth every 6 (six) hours as needed for severe pain. 08/22/13   Jennifer L Piepenbrink, PA-C  azithromycin (ZITHROMAX) 250 MG tablet Take 1 tablet (250 mg total) by mouth once. 08/26/13   Shelly Bombard, MD  cefixime (SUPRAX) 400 MG tablet Take 1  tablet (400 mg total) by mouth daily. 08/26/13   Shelly Bombard, MD  cephALEXin (KEFLEX) 500 MG capsule Take 1 capsule (500 mg total) by mouth 4 (four) times daily. 07/22/12   Ezequiel Essex, MD  fluconazole (DIFLUCAN) 150 MG tablet Take 1 tablet (150 mg total) by mouth once. 08/25/13   Shelly Bombard, MD  ibuprofen (ADVIL,MOTRIN) 800 MG tablet Take 1 tablet (800 mg total) by mouth every 8 (eight) hours as needed for mild pain or moderate pain. 08/31/13   Shelly Bombard, MD  lidocaine (XYLOCAINE) 2 % solution Use as directed 20 mLs in the mouth or throat as needed for mouth pain. 08/22/13   Jennifer L Piepenbrink, PA-C  Multiple Vitamins-Minerals (HAIR VITAMINS PO) Take by mouth.    Historical Provider, MD  penicillin v potassium (VEETID) 500 MG tablet Take 1 tablet (500 mg total) by mouth 4 (four) times daily. 08/22/13   Jennifer L Piepenbrink, PA-C  Phenazopyridine HCl (AZO TABS PO) Take by mouth.    Historical Provider, MD  valACYclovir (VALTREX) 500 MG tablet Take 1 tablet po bid x 3 days prn. 08/25/13   Shelly Bombard, MD    Triage Vitals: BP 111/73  Pulse 117  Temp(Src) 98.8 F (37.1 C) (Oral)  Resp 20  Ht 5\' 2"  (1.575 m)  Wt  151 lb (68.493 kg)  BMI 27.61 kg/m2  SpO2 100%  LMP 09/03/2013   Physical Exam  Nursing note and vitals reviewed. Constitutional:  Awake, alert, nontoxic appearance.  HENT:  Head: Atraumatic.  Eyes: Right eye exhibits no discharge. Left eye exhibits no discharge.  Neck: Neck supple.  Cardiovascular: Intact distal pulses.   Pulses:      Dorsalis pedis pulses are 2+ on the left side.  Pulmonary/Chest: Effort normal. She exhibits no tenderness.  Abdominal: Soft. There is no tenderness. There is no rebound.  Musculoskeletal: She exhibits no tenderness.       Left foot: She exhibits normal capillary refill.  Baseline ROM, no obvious new focal weakness. Bilateral upper extremities non tender, bilateral lower extremities non tender. No left leg inguinal  lymphadenopathy. Left of great toe tender swollen pus drainage lateral aspect nail plate only. Toes 2, 3, 4 non tender. Cap refill <2s  Neurological:  Mental status and motor strength appears baseline for patient and situation.  Skin: No rash noted.  Psychiatric: She has a normal mood and affect.    ED Course  Procedures (including critical care time)   DIAGNOSTIC STUDIES: Oxygen Saturation is 100% on RA, normal by my interpretation.     COORDINATION OF CARE: 3:32 PM-  Patient informed of clinical course, understand medical decision-making process, and agree with plan.  Incision and drainage of paronychia left great toe procedure 3:39 PM- Time out taken, sterile prep, incision and drainage procedure performed without anesthesia to the left lateral aspect of the left great toe. #15 blade was used to separate skin fold from nailplate, and there was a small amount of pus. Cautioned pt to return should she develop a fever, further pain, and red streaking up her left leg. Pt declines narcotic pain medication. Patient tolerated procedure well with no apparent immediate complications.   MDM  I doubt any other EMC precluding discharge at this time including, but not necessarily limited to the following:  Final diagnoses:  Paronychia of great toe, left    I doubt any other EMC precluding discharge at this time including, but not necessarily limited to the following:sepsis.  I personally performed the services described in this documentation, which was scribed in my presence. The recorded information has been reviewed and is accurate.     Christina Relic, MD 09/19/13 [phone]

## 2013-09-17 NOTE — Discharge Instructions (Signed)
Paronychia  Paronychia is an infection of the skin caused by germs. It happens by the fingernail or toenail. You can avoid it by not:  Pulling on hangnails.  Nail biting.  Thumb sucking.  Cutting fingernails and toenails too short.  Cutting the skin at the base and sides of the fingernail or toenail (cuticle). HOME CARE  Warm water soaks to left foot and massage left great toe to continue pus drainage until the wound heals.  Keep the fingers or toes very dry. Put rubber gloves over cotton gloves when putting hands in water.  Keep the wound clean and bandaged (dressed) as told by your doctor.  Soak the fingers or toes in warm water for 15 to 20 minutes. Soak them 3 to 4 times per day for germ infections. Fungal infections are difficult to treat. Fungal infections often require treatment for a long time.  Only take medicine as told by your doctor. GET HELP RIGHT AWAY IF:   You have redness, puffiness (swelling), or pain that gets worse.  You see lots of yellowish-white fluid (pus) coming from the wound.  You have a fever, vomiting, dizziness, severe pain, red streaks up your foot or leg, or other concerns. MAKE SURE YOU:  Understand these instructions.  Will watch your condition.  Will get help if you are not doing well or get worse. Document Released: 04/09/2009 Document Revised: 07/14/2011 Document Reviewed: 04/09/2009 [facility] Patient Information 2014 [place], [place].

## 2013-09-17 NOTE — ED Notes (Signed)
C/o left great toe swelling, drainage, and pain.  Reports hitting her toe on something three weeks prior, went and got a pedicure last week, and then developed these symptoms.  Denies fever, chills.

## 2013-09-17 NOTE — ED Notes (Signed)
Wound cleaned, dried and bandage applied.

## 2013-09-19 ENCOUNTER — Encounter (HOSPITAL_BASED_OUTPATIENT_CLINIC_OR_DEPARTMENT_OTHER): Payer: Self-pay | Admitting: Emergency Medicine

## 2013-09-19 ENCOUNTER — Emergency Department (HOSPITAL_BASED_OUTPATIENT_CLINIC_OR_DEPARTMENT_OTHER)
Admission: EM | Admit: 2013-09-19 | Discharge: 2013-09-20 | Disposition: A | Discharge status: Home / Self Care | Payer: BC Managed Care – PPO | Attending: Emergency Medicine | Admitting: Emergency Medicine

## 2013-09-19 DIAGNOSIS — Z79899 Other long term (current) drug therapy: Secondary | ICD-10-CM | POA: Insufficient documentation

## 2013-09-19 DIAGNOSIS — IMO0002 Reserved for concepts with insufficient information to code with codable children: Secondary | ICD-10-CM

## 2013-09-19 DIAGNOSIS — Z91040 Latex allergy status: Secondary | ICD-10-CM | POA: Insufficient documentation

## 2013-09-19 DIAGNOSIS — Z792 Long term (current) use of antibiotics: Secondary | ICD-10-CM | POA: Insufficient documentation

## 2013-09-19 DIAGNOSIS — L03039 Cellulitis of unspecified toe: Secondary | ICD-10-CM | POA: Insufficient documentation

## 2013-09-19 DIAGNOSIS — F17200 Nicotine dependence, unspecified, uncomplicated: Secondary | ICD-10-CM | POA: Insufficient documentation

## 2013-09-19 MED ORDER — CEPHALEXIN 500 MG PO CAPS: 500.0000 mg | ORAL_CAPSULE | Freq: Four times a day (QID) | ORAL | Status: DC

## 2013-09-19 MED ORDER — HYDROCODONE-ACETAMINOPHEN 5-325 MG PO TABS: 1.0000 | ORAL_TABLET | ORAL | Status: DC | PRN

## 2013-09-19 MED ORDER — SILVER NITRATE-POT NITRATE 75-25 % EX MISC
CUTANEOUS | Status: AC
Start: 2013-09-19 — End: 2013-09-19
  Administered 2013-09-19: 23:00:00
  Filled 2013-09-19: qty 2

## 2013-09-19 MED ORDER — BUPIVACAINE HCL (PF) 0.5 % IJ SOLN
INTRAMUSCULAR | Status: AC
  Administered 2013-09-19: 150 mg
  Filled 2013-09-19: qty 30

## 2013-09-19 NOTE — ED Provider Notes (Signed)
Medical screening examination/treatment/procedure(s) were performed by non-physician practitioner and as supervising physician I was immediately available for consultation/collaboration.   EKG Interpretation None        Malvin Johns, MD 09/19/13 2344

## 2013-09-19 NOTE — ED Notes (Signed)
Report received from [place], assumed care of patient at this time.

## 2013-09-19 NOTE — ED Notes (Signed)
Pt seen here sat for left  toe infection return today for wound check

## 2013-09-19 NOTE — ED Provider Notes (Signed)
CSN: [id number]     Arrival date & time 09/19/13  1900 History   First MD Initiated Contact with Patient 09/19/13 2156     Chief Complaint  Patient presents with  . Wound Check     (Consider location/radiation/quality/duration/timing/severity/associated sxs/prior Treatment) Patient is a 38 y.o. female presenting with wound check. The history is provided by the patient. No language interpreter was used.  Wound Check Pertinent negatives include no fever. Associated symptoms comments: She returns to the ED with complaint of persistent, worsening left great toe pain since having a paronychia I&D'd recently. She states the swelling is worse. No fever or persistent drainage. Marland Kitchen    History reviewed. No pertinent past medical history. Past Surgical History  Procedure Laterality Date  . Tubal ligation    . Hernia repair    . Cesarean section    . Cesarean section     History reviewed. No pertinent family history. History  Substance Use Topics  . Smoking status: Current Every Day Smoker  . Smokeless tobacco: Not on file  . Alcohol Use: Yes     Comment: social   OB History   Grav Para Term Preterm Abortions TAB SAB Ect Mult Living                 Review of Systems  Constitutional: Negative for fever.  Musculoskeletal:       See HPI.  Skin: Positive for wound.       See HPI.      Allergies  Latex  Home Medications   Prior to Admission medications   Medication Sig Start Date End Date Taking? Authorizing Provider  acetaminophen-codeine (TYLENOL #3) 300-30 MG per tablet Take 1 tablet by mouth every 6 (six) hours as needed for severe pain. 08/22/13   Jennifer L Piepenbrink, PA-C  azithromycin (ZITHROMAX) 250 MG tablet Take 1 tablet (250 mg total) by mouth once. 08/26/13   Shelly Bombard, MD  cefixime (SUPRAX) 400 MG tablet Take 1 tablet (400 mg total) by mouth daily. 08/26/13   Shelly Bombard, MD  cephALEXin (KEFLEX) 500 MG capsule Take 1 capsule (500 mg total) by mouth 4  (four) times daily. 07/22/12   Ezequiel Essex, MD  fluconazole (DIFLUCAN) 150 MG tablet Take 1 tablet (150 mg total) by mouth once. 08/25/13   Shelly Bombard, MD  ibuprofen (ADVIL,MOTRIN) 800 MG tablet Take 1 tablet (800 mg total) by mouth every 8 (eight) hours as needed for mild pain or moderate pain. 08/31/13   Shelly Bombard, MD  lidocaine (XYLOCAINE) 2 % solution Use as directed 20 mLs in the mouth or throat as needed for mouth pain. 08/22/13   Jennifer L Piepenbrink, PA-C  Multiple Vitamins-Minerals (HAIR VITAMINS PO) Take by mouth.    Historical Provider, MD  penicillin v potassium (VEETID) 500 MG tablet Take 1 tablet (500 mg total) by mouth 4 (four) times daily. 08/22/13   Jennifer L Piepenbrink, PA-C  Phenazopyridine HCl (AZO TABS PO) Take by mouth.    Historical Provider, MD  valACYclovir (VALTREX) 500 MG tablet Take 1 tablet po bid x 3 days prn. 08/25/13   Shelly Bombard, MD   BP 126/82  Pulse 95  Temp(Src) 98.5 F (36.9 C) (Oral)  Resp 16  Wt 151 lb (68.493 kg)  SpO2 100%  LMP 09/03/2013 Physical Exam  Constitutional: She is oriented to person, place, and time. She appears well-developed and well-nourished. No distress.  Musculoskeletal:  Left great toenail swollen at the lower  corner laterally extending distally without drainage or redness. No subungual hematoma. Nail intact.   Neurological: She is alert and oriented to person, place, and time.    ED Course  Procedures (including critical care time) Labs Review Labs Reviewed - No data to display  Imaging Review No results found.   EKG Interpretation None      MDM   Final diagnoses:  None    1. Paronychia  DDx: ingrown toe nail vs persistent paronychia.   Digital block performed lateral surface left great toe with adequate anesthesia. Nail manipulated, wound explored. No ingrown nail to excise. Paronychia edges debrided, cleaned and bandaged.     Dewaine Oats, PA-C 09/19/13 [phone]

## 2013-09-19 NOTE — Discharge Instructions (Signed)
Paronychia Paronychia is an inflammatory reaction involving the folds of the skin surrounding the fingernail. This is commonly caused by an infection in the skin around a nail. The most common cause of paronychia is frequent wetting of the hands (as seen with bartenders, food servers, nurses or others who wet their hands). This makes the skin around the fingernail susceptible to infection by bacteria (germs) or fungus. Other predisposing factors are:  Aggressive manicuring.  Nail biting.  Thumb sucking. The most common cause is a staphylococcal (a type of germ) infection, or a fungal (Candida) infection. When caused by a germ, it usually comes on suddenly with redness, swelling, pus and is often painful. It may get under the nail and form an abscess (collection of pus), or form an abscess around the nail. If the nail itself is infected with a fungus, the treatment is usually prolonged and may require oral medicine for up to one year. Your caregiver will determine the length of time treatment is required. The paronychia caused by bacteria (germs) may largely be avoided by not pulling on hangnails or picking at cuticles. When the infection occurs at the tips of the finger it is called felon. When the cause of paronychia is from the herpes simplex virus (HSV) it is called herpetic whitlow. TREATMENT  When an abscess is present treatment is often incision and drainage. This means that the abscess must be cut open so the pus can get out. When this is done, the following home care instructions should be followed. HOME CARE INSTRUCTIONS   It is important to keep the affected fingers very dry. Rubber or plastic gloves over cotton gloves should be used whenever the hand must be placed in water.  Keep wound clean, dry and dressed as suggested by your caregiver between warm soaks or warm compresses.  Soak in warm water for fifteen to twenty minutes three to four times per day for bacterial infections. Fungal  infections are very difficult to treat, so often require treatment for long periods of time.  For bacterial (germ) infections take antibiotics (medicine which kill germs) as directed and finish the prescription, even if the problem appears to be solved before the medicine is gone.  Only take over-the-counter or prescription medicines for pain, discomfort, or fever as directed by your caregiver. SEEK IMMEDIATE MEDICAL CARE IF:  You have redness, swelling, or increasing pain in the wound.  You notice pus coming from the wound.  You have a fever.  You notice a bad smell coming from the wound or dressing. Document Released: 10/15/2000 Document Revised: 07/14/2011 Document Reviewed: 06/16/2008 [employer] Patient Information 2014 [employer].  

## 2013-09-28 ENCOUNTER — Encounter: Payer: Self-pay | Admitting: Podiatry

## 2013-09-28 ENCOUNTER — Ambulatory Visit (INDEPENDENT_AMBULATORY_CARE_PROVIDER_SITE_OTHER): Payer: BC Managed Care – PPO | Admitting: Podiatry

## 2013-09-28 VITALS — BP 94/62 | HR 102 | Resp 16

## 2013-09-28 DIAGNOSIS — L03039 Cellulitis of unspecified toe: Secondary | ICD-10-CM

## 2013-09-28 NOTE — Progress Notes (Signed)
   Subjective:    Patient ID: Christina Valenzuela, female    DOB: 1975-06-06, 38 y.o.   MRN: [id number]  HPI Comments: i had a pedicure about 2 weeks ago and my toe (great left) looks like this. It happened slowly and its gotten worse. It hurts to walk and wear shoes. i went to the hospital twice and he cut it, the 2nd time she did numb it but did not cut on it. Stated it was an infected cuticle. They gave me a wooden shoe, Vicodin and cephalexin. i soaked in epsom salt and used peroxide. The toe is starting to smell.      Review of Systems  Musculoskeletal:       Difficulty walking  Skin: Positive for color change.       Change in nails  All other systems reviewed and are negative.      Objective:   Physical Exam        Assessment & Plan:

## 2013-09-28 NOTE — Progress Notes (Signed)
Subjective:     Patient ID: Christina Valenzuela, female   DOB: 04/20/1976, 38 y.o.   MRN: [id number]  HPI patient states she had a pedicure several weeks ago and has developed an infection in his on an antibiotic but it is not getting better  Review of Systems  All other systems reviewed and are negative.      Objective:   Physical Exam  Nursing note and vitals reviewed. Constitutional: She is oriented to person, place, and time.  Cardiovascular: Intact distal pulses.   Musculoskeletal: Normal range of motion.  Neurological: She is oriented to person, place, and time.  Skin: Skin is warm.   neurovascular status intact with no range of motion issues and good muscle strength. Digits are well perfused arch height is normal and on the left hallux lateral border I noted a large localized abscess of tissue within incurvated nail and mild odor     Assessment:     Paronychia infection left hallux lateral border    Plan:     H&P and condition reviewed with patient. I went ahead I infiltrated 60 mg Xylocaine Marcaine mixture remove a small amount of nailbed on the lateral side and all the abscess tissue allowing channel for drainage which should hopefully heal this nailbed. I explained the nail may be grow normally or she may develop some discoloration or possible chronic ingrown nail. Instructed on soaks

## 2013-09-28 NOTE — Patient Instructions (Signed)

## 2013-10-06 ENCOUNTER — Ambulatory Visit: Payer: BC Managed Care – PPO | Admitting: Podiatry

## 2013-11-03 ENCOUNTER — Ambulatory Visit: Payer: BC Managed Care – PPO | Admitting: Podiatry

## 2013-11-16 ENCOUNTER — Ambulatory Visit (INDEPENDENT_AMBULATORY_CARE_PROVIDER_SITE_OTHER): Payer: BC Managed Care – PPO | Admitting: Obstetrics

## 2013-11-16 ENCOUNTER — Encounter: Payer: Self-pay | Admitting: Obstetrics

## 2013-11-16 VITALS — BP 106/73 | HR 97 | Temp 97.9°F | Ht 62.0 in | Wt 149.0 lb

## 2013-11-16 DIAGNOSIS — Z113 Encounter for screening for infections with a predominantly sexual mode of transmission: Secondary | ICD-10-CM

## 2013-11-16 DIAGNOSIS — N946 Dysmenorrhea, unspecified: Secondary | ICD-10-CM

## 2013-11-16 MED ORDER — OXYCODONE HCL 10 MG PO TABS: 10.0000 mg | ORAL_TABLET | Freq: Four times a day (QID) | ORAL | Status: DC | PRN

## 2013-11-17 LAB — WET PREP BY MOLECULAR PROBE
CANDIDA SPECIES: NEGATIVE
GARDNERELLA VAGINALIS: NEGATIVE
TRICHOMONAS VAG: NEGATIVE

## 2013-11-17 LAB — GC/CHLAMYDIA PROBE AMP
CT Probe RNA: NEGATIVE
GC PROBE AMP APTIMA: NEGATIVE

## 2013-11-17 NOTE — Progress Notes (Signed)
Patient ID: Christina Valenzuela, female   DOB: 12-08-1975, 38 y.o.   MRN: [id number]  Chief Complaint  Patient presents with  . Problem    HPI Christina Valenzuela is a 38 y.o. female.  H/O positive chlamydia culture in April 2015, treated.  Presents for TOC cultures.  HPI  History reviewed. No pertinent past medical history.  Past Surgical History  Procedure Laterality Date  . Tubal ligation    . Hernia repair    . Cesarean section    . Cesarean section      History reviewed. No pertinent family history.  Social History History  Substance Use Topics  . Smoking status: Current Every Day Smoker  . Smokeless tobacco: Never Used  . Alcohol Use: Yes     Comment: social    Allergies  Allergen Reactions  . Latex Swelling    Vaginal swelling    Current Outpatient Prescriptions  Medication Sig Dispense Refill  . ibuprofen (ADVIL,MOTRIN) 800 MG tablet Take 1 tablet (800 mg total) by mouth every 8 (eight) hours as needed for mild pain or moderate pain.  30 tablet  11  . Oxycodone HCl 10 MG TABS Take 1 tablet (10 mg total) by mouth every 6 (six) hours as needed.  40 tablet  0   No current facility-administered medications for this visit.    Review of Systems Review of Systems Constitutional: negative for fatigue and weight loss Respiratory: negative for cough and wheezing Cardiovascular: negative for chest pain, fatigue and palpitations Gastrointestinal: negative for abdominal pain and change in bowel habits Genitourinary:negative Integument/breast: negative for nipple discharge Musculoskeletal:negative for myalgias Neurological: negative for gait problems and tremors Behavioral/Psych: negative for abusive relationship, depression Endocrine: negative for temperature intolerance     Blood pressure 106/73, pulse 97, temperature 97.9 F (36.6 C), height 5\' 2"  (1.575 m), weight 149 lb (67.586 kg), last menstrual period 10/23/2013.  Physical Exam Physical Exam General:   alert   Skin:   no rash or abnormalities  Lungs:   clear to auscultation bilaterally  Heart:   regular rate and rhythm, S1, S2 normal, no murmur, click, rub or gallop  Breasts:   normal without suspicious masses, skin or nipple changes or axillary nodes  Abdomen:  normal findings: no organomegaly, soft, non-tender and no hernia  Pelvis:  External genitalia: normal general appearance Urinary system: urethral meatus normal and bladder without fullness, nontender Vaginal: normal without tenderness, induration or masses Cervix: normal appearance Adnexa: normal bimanual exam Uterus: anteverted and non-tender, normal size      Data Reviewed Labs  Assessment    H/O positive chlamydia, treated.  Presents for TOC.  Menorrhagia / Dysmenorrhea   Plan   GC / Chlamydia cultures  Considering Mirena IUD or Endometrial Ablation for Menorrhagia / Dysmenorrhea  Orders Placed This Encounter  Procedures  . WET PREP BY MOLECULAR PROBE  . GC/Chlamydia Probe Amp   Meds ordered this encounter  Medications  . Oxycodone HCl 10 MG TABS    Sig: Take 1 tablet (10 mg total) by mouth every 6 (six) hours as needed.    Dispense:  40 tablet    Refill:  0        HARPER,CHARLES A 11/17/2013, 5:12 AM

## 2013-11-18 ENCOUNTER — Encounter: Payer: Self-pay | Admitting: Obstetrics

## 2013-11-18 ENCOUNTER — Ambulatory Visit (INDEPENDENT_AMBULATORY_CARE_PROVIDER_SITE_OTHER): Payer: BC Managed Care – PPO | Admitting: Obstetrics

## 2013-11-18 VITALS — BP 109/76 | HR 98 | Temp 98.3°F

## 2013-11-18 DIAGNOSIS — Z30430 Encounter for insertion of intrauterine contraceptive device: Secondary | ICD-10-CM

## 2013-11-18 MED ORDER — LEVONORGESTREL 20 MCG/24HR IU IUD
INTRAUTERINE_SYSTEM | Freq: Once | INTRAUTERINE | Status: AC
  Administered 2013-11-18: 12:00:00 via INTRAUTERINE

## 2013-11-18 MED ORDER — FLUCONAZOLE 150 MG PO TABS: 150.0000 mg | ORAL_TABLET | Freq: Once | ORAL | Status: DC

## 2013-11-18 MED ORDER — METRONIDAZOLE 500 MG PO TABS: 500.0000 mg | ORAL_TABLET | Freq: Two times a day (BID) | ORAL | Status: DC

## 2013-11-18 MED ORDER — DOXYCYCLINE HYCLATE 100 MG PO CAPS: 100.0000 mg | ORAL_CAPSULE | Freq: Two times a day (BID) | ORAL | Status: DC

## 2013-11-18 NOTE — Addendum Note (Signed)
Addended by: Baltazar Najjar A on: 11/18/2013 12:16 PM   Modules accepted: Orders

## 2013-11-18 NOTE — Addendum Note (Signed)
Addended by: Lewie Loron D on: 11/18/2013 12:06 PM   Modules accepted: Orders

## 2013-11-18 NOTE — Progress Notes (Signed)
IUD Insertion Procedure Note  Pre-operative Diagnosis: Desire Contraception  Post-operative Diagnosis: same  Indications: contraception  Procedure Details  Urine pregnancy test was not done.  The risks (including infection, bleeding, pain, and uterine perforation) and benefits of the procedure were explained to the patient and Written informed consent was obtained.    Cervix cleansed with Betadine. Uterus sounded to 7 cm. IUD inserted without difficulty. String visible and trimmed. Patient tolerated procedure well.  IUD Information: Mirena, Lot # [id number], Expiration date 08 / 17.  Condition: Stable  Complications: None  Plan:  The patient was advised to call for any fever or for prolonged or severe pain or bleeding. She was advised to use NSAID as needed for mild to moderate pain.   Attending Physician Documentation: I was present for or participated in the entire procedure, including opening and closing.

## 2013-12-29 ENCOUNTER — Ambulatory Visit: Payer: BC Managed Care – PPO | Admitting: Obstetrics

## 2014-01-17 ENCOUNTER — Emergency Department (HOSPITAL_BASED_OUTPATIENT_CLINIC_OR_DEPARTMENT_OTHER)
Admission: EM | Admit: 2014-01-17 | Discharge: 2014-01-17 | Disposition: A | Discharge status: Home / Self Care | Payer: BC Managed Care – PPO | Attending: Emergency Medicine | Admitting: Emergency Medicine

## 2014-01-17 ENCOUNTER — Encounter (HOSPITAL_BASED_OUTPATIENT_CLINIC_OR_DEPARTMENT_OTHER): Payer: Self-pay | Admitting: Emergency Medicine

## 2014-01-17 DIAGNOSIS — F17200 Nicotine dependence, unspecified, uncomplicated: Secondary | ICD-10-CM | POA: Insufficient documentation

## 2014-01-17 DIAGNOSIS — Z791 Long term (current) use of non-steroidal anti-inflammatories (NSAID): Secondary | ICD-10-CM | POA: Insufficient documentation

## 2014-01-17 DIAGNOSIS — K089 Disorder of teeth and supporting structures, unspecified: Secondary | ICD-10-CM | POA: Insufficient documentation

## 2014-01-17 DIAGNOSIS — Z79899 Other long term (current) drug therapy: Secondary | ICD-10-CM | POA: Diagnosis not present

## 2014-01-17 DIAGNOSIS — Z91040 Latex allergy status: Secondary | ICD-10-CM | POA: Diagnosis not present

## 2014-01-17 DIAGNOSIS — K0889 Other specified disorders of teeth and supporting structures: Secondary | ICD-10-CM

## 2014-01-17 DIAGNOSIS — Z792 Long term (current) use of antibiotics: Secondary | ICD-10-CM | POA: Diagnosis not present

## 2014-01-17 MED ORDER — PENICILLIN V POTASSIUM 500 MG PO TABS: 500.0000 mg | ORAL_TABLET | Freq: Three times a day (TID) | ORAL | Status: DC

## 2014-01-17 MED ORDER — OXYCODONE-ACETAMINOPHEN 5-325 MG PO TABS: 1.0000 | ORAL_TABLET | ORAL | Status: DC | PRN

## 2014-01-17 NOTE — ED Notes (Signed)
Pt. Reports pain in the same tooth all summer but insurance does not start till Oct.   Pt. Reports pain in the lower R quadrant of her mouth with a filling out of a tooth.

## 2014-01-17 NOTE — Discharge Instructions (Signed)
Return to the ED with any concerns including fever/chills, swelling and causing difficulty breathing or swallowing, vomiting and not able to keep down liquids or antibiotics, decreased level of alertness/lethargy, or any other alarming symptoms

## 2014-01-17 NOTE — ED Provider Notes (Signed)
CSN: [id number]     Arrival date & time 01/17/14  1607 History   First MD Initiated Contact with Patient 01/17/14 1625     Chief Complaint  Patient presents with  . Dental Pain     (Consider location/radiation/quality/duration/timing/severity/associated sxs/prior Treatment) HPI Pt presenting with c/o pain in right lower molar, she states she has had intermittent pain in that tooth for several months after a filling fell out.  Pain worsened over the past few days.  No fever, no swelling of face, no difficulty breathing or swallowing.  Pt has not had a dentist appointment yet as her insurance doesn't start to cover her until October 1.  She has tried ibuprofen without relief of the pain.  She states pain is worse with brushing her teeth.  Pain is constant and throbbing.  There are no other associated systemic symptoms, there are no other alleviating or modifying factors.   History reviewed. No pertinent past medical history. Past Surgical History  Procedure Laterality Date  . Tubal ligation    . Hernia repair    . Cesarean section    . Cesarean section     No family history on file. History  Substance Use Topics  . Smoking status: Current Every Day Smoker  . Smokeless tobacco: Never Used  . Alcohol Use: Yes     Comment: social   OB History   Grav Para Term Preterm Abortions TAB SAB Ect Mult Living   8 4 3 1 4 2 2  0 1 5     Review of Systems ROS reviewed and all otherwise negative except for mentioned in HPI    Allergies  Latex  Home Medications   Prior to Admission medications   Medication Sig Start Date End Date Taking? Authorizing Provider  doxycycline (VIBRAMYCIN) 100 MG capsule Take 1 capsule (100 mg total) by mouth 2 (two) times daily. 11/18/13   Shelly Bombard, MD  fluconazole (DIFLUCAN) 150 MG tablet Take 1 tablet (150 mg total) by mouth once. 11/18/13   Shelly Bombard, MD  ibuprofen (ADVIL,MOTRIN) 800 MG tablet Take 1 tablet (800 mg total) by mouth every 8  (eight) hours as needed for mild pain or moderate pain. 08/31/13   Shelly Bombard, MD  metroNIDAZOLE (FLAGYL) 500 MG tablet Take 1 tablet (500 mg total) by mouth 2 (two) times daily. 11/18/13   Shelly Bombard, MD  Oxycodone HCl 10 MG TABS Take 1 tablet (10 mg total) by mouth every 6 (six) hours as needed. 11/16/13   Shelly Bombard, MD  oxyCODONE-acetaminophen (ROXICET) 5-325 MG per tablet Take 1-2 tablets by mouth every 4 (four) hours as needed for severe pain. 01/17/14   Threasa Beards, MD  penicillin v potassium (VEETID) 500 MG tablet Take 1 tablet (500 mg total) by mouth 3 (three) times daily. 01/17/14   Threasa Beards, MD   BP 127/84  Pulse 96  Temp(Src) 98.8 F (37.1 C) (Oral)  Resp 18  Ht 5\' 2"  (1.575 m)  Wt 151 lb (68.493 kg)  BMI 27.61 kg/m2  SpO2 99%  LMP 01/09/2014 Vitals reviewed Physical Exam Physical Examination: General appearance - alert, well appearing, and in no distress Mental status - alert, oriented to person, place, and time Eyes - no conjunctival injection, no scleral icterus Mouth - mucous membranes moist, pharynx normal without lesions, right lower molar with erosion of posterior surface, no swelling under tongue Neck - supple, no significant adenopathy Chest - clear to auscultation, no wheezes,  rales or rhonchi, symmetric air entry Heart - normal rate, regular rhythm, normal S1, S2, no murmurs, rubs, clicks or gallops Extremities - peripheral pulses normal, no pedal edema, no clubbing or cyanosis Skin - normal coloration and turgor, no rashes  ED Course  Procedures (including critical care time) Labs Review Labs Reviewed - No data to display  Imaging Review No results found.   EKG Interpretation None      MDM   Final diagnoses:  Pain, dental    Pt presenting with c/o dental pain in right lower molar.  No sign of acute abscess. Pt started on penicillin and given pain control.  Given information for dental followup.  Discharged with strict  return precautions.  Pt agreeable with plan.    Threasa Beards, MD 01/17/14 [phone]

## 2014-01-18 ENCOUNTER — Encounter (HOSPITAL_BASED_OUTPATIENT_CLINIC_OR_DEPARTMENT_OTHER): Payer: Self-pay | Admitting: Emergency Medicine

## 2014-01-18 ENCOUNTER — Emergency Department (HOSPITAL_BASED_OUTPATIENT_CLINIC_OR_DEPARTMENT_OTHER)
Admission: EM | Admit: 2014-01-18 | Discharge: 2014-01-18 | Disposition: A | Discharge status: Home / Self Care | Payer: BC Managed Care – PPO | Attending: Emergency Medicine | Admitting: Emergency Medicine

## 2014-01-18 DIAGNOSIS — Z91040 Latex allergy status: Secondary | ICD-10-CM | POA: Diagnosis not present

## 2014-01-18 DIAGNOSIS — R109 Unspecified abdominal pain: Secondary | ICD-10-CM | POA: Diagnosis not present

## 2014-01-18 DIAGNOSIS — Z792 Long term (current) use of antibiotics: Secondary | ICD-10-CM | POA: Diagnosis not present

## 2014-01-18 DIAGNOSIS — Z79899 Other long term (current) drug therapy: Secondary | ICD-10-CM | POA: Insufficient documentation

## 2014-01-18 DIAGNOSIS — R220 Localized swelling, mass and lump, head: Secondary | ICD-10-CM | POA: Insufficient documentation

## 2014-01-18 DIAGNOSIS — K047 Periapical abscess without sinus: Secondary | ICD-10-CM | POA: Diagnosis not present

## 2014-01-18 DIAGNOSIS — F17200 Nicotine dependence, unspecified, uncomplicated: Secondary | ICD-10-CM | POA: Insufficient documentation

## 2014-01-18 DIAGNOSIS — R221 Localized swelling, mass and lump, neck: Secondary | ICD-10-CM | POA: Diagnosis present

## 2014-01-18 LAB — URINALYSIS, ROUTINE W REFLEX MICROSCOPIC
Bilirubin Urine: NEGATIVE
GLUCOSE, UA: NEGATIVE mg/dL
Hgb urine dipstick: NEGATIVE
Ketones, ur: 15 mg/dL — AB
LEUKOCYTES UA: NEGATIVE
NITRITE: NEGATIVE
PROTEIN: NEGATIVE mg/dL
Specific Gravity, Urine: 1.021 (ref 1.005–1.030)
UROBILINOGEN UA: 1 mg/dL (ref 0.0–1.0)
pH: 6.5 (ref 5.0–8.0)

## 2014-01-18 MED ORDER — CLINDAMYCIN PHOSPHATE 900 MG/50ML IV SOLN
900.0000 mg | Freq: Once | INTRAVENOUS | Status: AC
  Administered 2014-01-18: 900 mg via INTRAVENOUS
  Filled 2014-01-18: qty 50

## 2014-01-18 MED ORDER — CLINDAMYCIN HCL 300 MG PO CAPS: 300.0000 mg | ORAL_CAPSULE | Freq: Three times a day (TID) | ORAL | Status: DC

## 2014-01-18 NOTE — Discharge Instructions (Signed)

## 2014-01-18 NOTE — ED Provider Notes (Signed)
Medical screening examination/treatment/procedure(s) were performed by non-physician practitioner and as supervising physician I was immediately available for consultation/collaboration.   EKG Interpretation None        Ezequiel Essex, MD 01/18/14 2356

## 2014-01-18 NOTE — ED Notes (Signed)
Pt presents with left lower jaw swelling, seen yesterday for tooth pain, attempted to call on call md today given to her with her paperwork but was told that they were not on call and that they would be unable to see her. This afternoon her right lower jaw began to swell. Pt taking antibiotics yesterday

## 2014-01-18 NOTE — ED Provider Notes (Signed)
CSN: [id number]     Arrival date & time 01/18/14  1928 History   First MD Initiated Contact with Patient 01/18/14 1956     Chief Complaint  Patient presents with  . Facial Swelling     (Consider location/radiation/quality/duration/timing/severity/associated sxs/prior Treatment) Patient is a 38 y.o. female presenting with dental injury. The history is provided by the patient. No language interpreter was used.  Dental Injury This is a new problem. The current episode started today. The problem occurs constantly. The problem has been gradually worsening. Associated symptoms include abdominal pain. Pertinent negatives include no nausea or vomiting. Nothing aggravates the symptoms. She has tried nothing for the symptoms. The treatment provided moderate relief.    History reviewed. No pertinent past medical history. Past Surgical History  Procedure Laterality Date  . Tubal ligation    . Hernia repair    . Cesarean section    . Cesarean section     History reviewed. No pertinent family history. History  Substance Use Topics  . Smoking status: Current Every Day Smoker  . Smokeless tobacco: Never Used  . Alcohol Use: Yes     Comment: social   OB History   Grav Para Term Preterm Abortions TAB SAB Ect Mult Living   8 4 3 1 4 2 2  0 1 5     Review of Systems  HENT: Positive for dental problem and facial swelling.   Gastrointestinal: Positive for abdominal pain. Negative for nausea and vomiting.  All other systems reviewed and are negative.     Allergies  Latex  Home Medications   Prior to Admission medications   Medication Sig Start Date End Date Taking? Authorizing Provider  doxycycline (VIBRAMYCIN) 100 MG capsule Take 1 capsule (100 mg total) by mouth 2 (two) times daily. 11/18/13   Shelly Bombard, MD  fluconazole (DIFLUCAN) 150 MG tablet Take 1 tablet (150 mg total) by mouth once. 11/18/13   Shelly Bombard, MD  ibuprofen (ADVIL,MOTRIN) 800 MG tablet Take 1 tablet (800 mg  total) by mouth every 8 (eight) hours as needed for mild pain or moderate pain. 08/31/13   Shelly Bombard, MD  metroNIDAZOLE (FLAGYL) 500 MG tablet Take 1 tablet (500 mg total) by mouth 2 (two) times daily. 11/18/13   Shelly Bombard, MD  Oxycodone HCl 10 MG TABS Take 1 tablet (10 mg total) by mouth every 6 (six) hours as needed. 11/16/13   Shelly Bombard, MD  oxyCODONE-acetaminophen (ROXICET) 5-325 MG per tablet Take 1-2 tablets by mouth every 4 (four) hours as needed for severe pain. 01/17/14   Threasa Beards, MD  penicillin v potassium (VEETID) 500 MG tablet Take 1 tablet (500 mg total) by mouth 3 (three) times daily. 01/17/14   Threasa Beards, MD   BP 116/86  Pulse 106  Resp 18  SpO2 100%  LMP 01/09/2014 Physical Exam  Nursing note and vitals reviewed. Constitutional: She appears well-developed and well-nourished.  Eyes: EOM are normal. Pupils are equal, round, and reactive to light.  Pulmonary/Chest: Effort normal.  Skin: Skin is warm.  Psychiatric: She has a normal mood and affect.  swelling right face, oozing from gumline  ED Course  Procedures (including critical care time) Labs Review Labs Reviewed  URINALYSIS, ROUTINE W REFLEX MICROSCOPIC - Abnormal; Notable for the following:    APPearance CLOUDY (*)    Ketones, ur 15 (*)    All other components within normal limits    Imaging Review No results found.  EKG Interpretation None      MDM   Final diagnoses:  Abscessed tooth    Pt given Iv clindamycin here, rx for clindamycin,   Pt advised to follow up with Dr. Donn Pierini dentist    Fransico Meadow, PA-C 01/18/14 2100

## 2014-01-19 ENCOUNTER — Emergency Department (HOSPITAL_BASED_OUTPATIENT_CLINIC_OR_DEPARTMENT_OTHER)
Admission: EM | Admit: 2014-01-19 | Discharge: 2014-01-19 | Disposition: A | Discharge status: Home / Self Care | Payer: BC Managed Care – PPO | Attending: Emergency Medicine | Admitting: Emergency Medicine

## 2014-01-19 ENCOUNTER — Encounter (HOSPITAL_BASED_OUTPATIENT_CLINIC_OR_DEPARTMENT_OTHER): Payer: Self-pay | Admitting: Emergency Medicine

## 2014-01-19 DIAGNOSIS — F17200 Nicotine dependence, unspecified, uncomplicated: Secondary | ICD-10-CM | POA: Diagnosis not present

## 2014-01-19 DIAGNOSIS — R221 Localized swelling, mass and lump, neck: Secondary | ICD-10-CM

## 2014-01-19 DIAGNOSIS — K047 Periapical abscess without sinus: Secondary | ICD-10-CM | POA: Insufficient documentation

## 2014-01-19 DIAGNOSIS — Z91040 Latex allergy status: Secondary | ICD-10-CM | POA: Diagnosis not present

## 2014-01-19 DIAGNOSIS — R220 Localized swelling, mass and lump, head: Secondary | ICD-10-CM | POA: Diagnosis present

## 2014-01-19 DIAGNOSIS — Z792 Long term (current) use of antibiotics: Secondary | ICD-10-CM | POA: Diagnosis not present

## 2014-01-19 DIAGNOSIS — Z79899 Other long term (current) drug therapy: Secondary | ICD-10-CM | POA: Diagnosis not present

## 2014-01-19 MED ORDER — CEFTRIAXONE SODIUM 1 G IJ SOLR
INTRAMUSCULAR | Status: AC
  Filled 2014-01-19: qty 10

## 2014-01-19 MED ORDER — DEXTROSE 5 % IV SOLN
1.0000 g | Freq: Once | INTRAVENOUS | Status: AC
  Administered 2014-01-19: 1 g via INTRAVENOUS

## 2014-01-19 NOTE — ED Notes (Signed)
Pt reports she was supposed to come back for IV abx today for facial abscess

## 2014-01-19 NOTE — ED Provider Notes (Signed)
CSN: [id number]     Arrival date & time 01/19/14  1815 History   First MD Initiated Contact with Patient 01/19/14 1841     Chief Complaint  Patient presents with  . Facial Swelling     (Consider location/radiation/quality/duration/timing/severity/associated sxs/prior Treatment) HPI Comments: Pt here for a repeat dosage of antibiotics.  Pt seen by me yesterday and given IV antibioitc.  She will see Dr. Benson Norway tomorrow to have tooth extracted.  The history is provided by the patient. No language interpreter was used.    History reviewed. No pertinent past medical history. Past Surgical History  Procedure Laterality Date  . Tubal ligation    . Hernia repair    . Cesarean section    . Cesarean section     No family history on file. History  Substance Use Topics  . Smoking status: Current Every Day Smoker  . Smokeless tobacco: Never Used  . Alcohol Use: Yes     Comment: social   OB History   Grav Para Term Preterm Abortions TAB SAB Ect Mult Living   8 4 3 1 4 2 2  0 1 5     Review of Systems  All other systems reviewed and are negative.     Allergies  Latex  Home Medications   Prior to Admission medications   Medication Sig Start Date End Date Taking? Authorizing Provider  clindamycin (CLEOCIN) 300 MG capsule Take 1 capsule (300 mg total) by mouth 3 (three) times daily. 01/18/14   Fransico Meadow, PA-C  doxycycline (VIBRAMYCIN) 100 MG capsule Take 1 capsule (100 mg total) by mouth 2 (two) times daily. 11/18/13   Shelly Bombard, MD  fluconazole (DIFLUCAN) 150 MG tablet Take 1 tablet (150 mg total) by mouth once. 11/18/13   Shelly Bombard, MD  ibuprofen (ADVIL,MOTRIN) 800 MG tablet Take 1 tablet (800 mg total) by mouth every 8 (eight) hours as needed for mild pain or moderate pain. 08/31/13   Shelly Bombard, MD  metroNIDAZOLE (FLAGYL) 500 MG tablet Take 1 tablet (500 mg total) by mouth 2 (two) times daily. 11/18/13   Shelly Bombard, MD  Oxycodone HCl 10 MG TABS Take 1  tablet (10 mg total) by mouth every 6 (six) hours as needed. 11/16/13   Shelly Bombard, MD  oxyCODONE-acetaminophen (ROXICET) 5-325 MG per tablet Take 1-2 tablets by mouth every 4 (four) hours as needed for severe pain. 01/17/14   Threasa Beards, MD  penicillin v potassium (VEETID) 500 MG tablet Take 1 tablet (500 mg total) by mouth 3 (three) times daily. 01/17/14   Threasa Beards, MD   BP 102/63  Pulse 100  Temp(Src) 97.8 F (36.6 C) (Oral)  Resp 18  Ht 5\' 2"  (1.575 m)  Wt 151 lb (68.493 kg)  BMI 27.61 kg/m2  SpO2 96%  LMP 01/09/2014 Physical Exam  Nursing note and vitals reviewed. Constitutional: She appears well-developed.  HENT:  Head: Normocephalic.  Decreased facial swelling,   Pt looks better than yesterday.   Eyes: Pupils are equal, round, and reactive to light.  Musculoskeletal: Normal range of motion.  Neurological: She is alert.  Skin: Skin is warm.  Psychiatric: She has a normal mood and affect.    ED Course  Procedures (including critical care time) Labs Review Labs Reviewed - No data to display  Imaging Review No results found.   EKG Interpretation None      MDM   Final diagnoses:  Dental abscess  Follow up with Dr. Benson Norway as scheduled    Fransico Meadow, PA-C 01/19/14 2002

## 2014-01-19 NOTE — ED Provider Notes (Signed)
Medical screening examination/treatment/procedure(s) were performed by non-physician practitioner and as supervising physician I was immediately available for consultation/collaboration.    Dorie Rank, MD 01/19/14 2017

## 2014-01-19 NOTE — Discharge Instructions (Signed)

## 2014-02-07 ENCOUNTER — Telehealth: Payer: Self-pay | Admitting: *Deleted

## 2014-02-07 NOTE — Telephone Encounter (Signed)
Patient is requesting an appointment for an IUD removal. Patient states she is still having the bleeding. Patient states she got the IUD to help with the heavy bleeding she was already having. Schedule patient for a consult with Dr. Jodi Mourning regarding IUD removal.

## 2014-02-16 ENCOUNTER — Ambulatory Visit (INDEPENDENT_AMBULATORY_CARE_PROVIDER_SITE_OTHER): Payer: BC Managed Care – PPO | Admitting: Obstetrics

## 2014-02-16 ENCOUNTER — Encounter: Payer: Self-pay | Admitting: Obstetrics

## 2014-02-16 VITALS — BP 114/82 | HR 83 | Wt 153.0 lb

## 2014-02-16 DIAGNOSIS — A499 Bacterial infection, unspecified: Secondary | ICD-10-CM

## 2014-02-16 DIAGNOSIS — B373 Candidiasis of vulva and vagina: Secondary | ICD-10-CM

## 2014-02-16 DIAGNOSIS — B3731 Acute candidiasis of vulva and vagina: Secondary | ICD-10-CM

## 2014-02-16 DIAGNOSIS — N939 Abnormal uterine and vaginal bleeding, unspecified: Secondary | ICD-10-CM

## 2014-02-16 DIAGNOSIS — N760 Acute vaginitis: Secondary | ICD-10-CM

## 2014-02-16 DIAGNOSIS — Z86018 Personal history of other benign neoplasm: Secondary | ICD-10-CM | POA: Insufficient documentation

## 2014-02-16 DIAGNOSIS — R399 Unspecified symptoms and signs involving the genitourinary system: Secondary | ICD-10-CM

## 2014-02-16 DIAGNOSIS — B9689 Other specified bacterial agents as the cause of diseases classified elsewhere: Secondary | ICD-10-CM | POA: Insufficient documentation

## 2014-02-16 MED ORDER — FLUCONAZOLE 150 MG PO TABS: 150.0000 mg | ORAL_TABLET | Freq: Once | ORAL | Status: DC

## 2014-02-16 MED ORDER — LEVONORGESTREL-ETHINYL ESTRAD 0.15-30 MG-MCG PO TABS: 1.0000 | ORAL_TABLET | Freq: Every day | ORAL | Status: DC

## 2014-02-16 MED ORDER — CLINDAMYCIN HCL 300 MG PO CAPS: 300.0000 mg | ORAL_CAPSULE | Freq: Three times a day (TID) | ORAL | Status: DC

## 2014-02-16 MED ORDER — NITROFURANTOIN MONOHYD MACRO 100 MG PO CAPS: 100.0000 mg | ORAL_CAPSULE | Freq: Two times a day (BID) | ORAL | Status: DC

## 2014-02-16 NOTE — Progress Notes (Signed)
Patient ID: ALEX MCMANIGAL, female   DOB: 1975/11/19, 38 y.o.   MRN: [id number]  Chief Complaint  Patient presents with  . Contraception    would like Mirena out    HPI Harolyn Cocker Tyrone Nine is a 38 y.o. female.  Continuos  Bleeding since July with Mirena IUD.  UTI symptoms. HPI  History reviewed. No pertinent past medical history.  Past Surgical History  Procedure Laterality Date  . Tubal ligation    . Hernia repair    . Cesarean section    . Cesarean section      History reviewed. No pertinent family history.  Social History History  Substance Use Topics  . Smoking status: Current Every Day Smoker  . Smokeless tobacco: Never Used  . Alcohol Use: Yes     Comment: social    Allergies  Allergen Reactions  . Latex Swelling    Vaginal swelling    Current Outpatient Prescriptions  Medication Sig Dispense Refill  . clindamycin (CLEOCIN) 300 MG capsule Take 1 capsule (300 mg total) by mouth 3 (three) times daily.  14 capsule  2  . doxycycline (VIBRAMYCIN) 100 MG capsule Take 1 capsule (100 mg total) by mouth 2 (two) times daily.  14 capsule  0  . fluconazole (DIFLUCAN) 150 MG tablet Take 1 tablet (150 mg total) by mouth once.  1 tablet  2  . ibuprofen (ADVIL,MOTRIN) 800 MG tablet Take 1 tablet (800 mg total) by mouth every 8 (eight) hours as needed for mild pain or moderate pain.  30 tablet  11  . levonorgestrel-ethinyl estradiol (NORDETTE) 0.15-30 MG-MCG tablet Take 1 tablet by mouth daily.  1 Package  1  . metroNIDAZOLE (FLAGYL) 500 MG tablet Take 1 tablet (500 mg total) by mouth 2 (two) times daily.  14 tablet  0  . nitrofurantoin, macrocrystal-monohydrate, (MACROBID) 100 MG capsule Take 1 capsule (100 mg total) by mouth 2 (two) times daily. 1 po BID x 7days  14 capsule  2  . Oxycodone HCl 10 MG TABS Take 1 tablet (10 mg total) by mouth every 6 (six) hours as needed.  40 tablet  0  . oxyCODONE-acetaminophen (ROXICET) 5-325 MG per tablet Take 1-2 tablets by mouth every 4 (four)  hours as needed for severe pain.  20 tablet  0  . penicillin v potassium (VEETID) 500 MG tablet Take 1 tablet (500 mg total) by mouth 3 (three) times daily.  30 tablet  0   No current facility-administered medications for this visit.    Review of Systems Review of Systems Constitutional: negative for fatigue and weight loss Respiratory: negative for cough and wheezing Cardiovascular: negative for chest pain, fatigue and palpitations Gastrointestinal: negative for abdominal pain and change in bowel habits Genitourinary: AUB with IUD.  UTI symptoms. Integument/breast: negative for nipple discharge Musculoskeletal:negative for myalgias Neurological: negative for gait problems and tremors Behavioral/Psych: negative for abusive relationship, depression Endocrine: negative for temperature intolerance     Blood pressure 114/82, pulse 83, weight 153 lb (69.4 kg), last menstrual period 01/09/2014.  Physical Exam Physical Exam:  Deferred   Data Reviewed labs  Assessment    AUB with Mirena IUD. UTI symptoms.     Plan   Nordette 28 Rx   Orders Placed This Encounter  Procedures  . Urine Culture  . POCT urinalysis dipstick   Meds ordered this encounter  Medications  . levonorgestrel-ethinyl estradiol (NORDETTE) 0.15-30 MG-MCG tablet    Sig: Take 1 tablet by mouth daily.  Dispense:  1 Package    Refill:  1  . nitrofurantoin, macrocrystal-monohydrate, (MACROBID) 100 MG capsule    Sig: Take 1 capsule (100 mg total) by mouth 2 (two) times daily. 1 po BID x 7days    Dispense:  14 capsule    Refill:  2  . clindamycin (CLEOCIN) 300 MG capsule    Sig: Take 1 capsule (300 mg total) by mouth 3 (three) times daily.    Dispense:  14 capsule    Refill:  2  . fluconazole (DIFLUCAN) 150 MG tablet    Sig: Take 1 tablet (150 mg total) by mouth once.    Dispense:  1 tablet    Refill:  2        Louise Victory A 02/16/2014, 10:16 PM

## 2014-02-18 LAB — URINE CULTURE
COLONY COUNT: NO GROWTH
ORGANISM ID, BACTERIA: NO GROWTH

## 2014-03-06 ENCOUNTER — Encounter: Payer: Self-pay | Admitting: Obstetrics

## 2014-04-19 ENCOUNTER — Other Ambulatory Visit: Payer: Self-pay | Admitting: Obstetrics

## 2014-06-14 ENCOUNTER — Emergency Department (HOSPITAL_COMMUNITY)
Admission: EM | Admit: 2014-06-14 | Discharge: 2014-06-14 | Discharge status: Against Medical Advice | Payer: Self-pay | Attending: Emergency Medicine | Admitting: Emergency Medicine

## 2014-06-14 ENCOUNTER — Encounter (HOSPITAL_COMMUNITY): Payer: Self-pay | Admitting: Emergency Medicine

## 2014-06-14 DIAGNOSIS — Z91040 Latex allergy status: Secondary | ICD-10-CM | POA: Insufficient documentation

## 2014-06-14 DIAGNOSIS — Z720 Tobacco use: Secondary | ICD-10-CM | POA: Insufficient documentation

## 2014-06-14 DIAGNOSIS — Z3202 Encounter for pregnancy test, result negative: Secondary | ICD-10-CM | POA: Insufficient documentation

## 2014-06-14 DIAGNOSIS — R109 Unspecified abdominal pain: Secondary | ICD-10-CM | POA: Insufficient documentation

## 2014-06-14 DIAGNOSIS — Z79899 Other long term (current) drug therapy: Secondary | ICD-10-CM | POA: Insufficient documentation

## 2014-06-14 DIAGNOSIS — R112 Nausea with vomiting, unspecified: Secondary | ICD-10-CM | POA: Insufficient documentation

## 2014-06-14 DIAGNOSIS — Z793 Long term (current) use of hormonal contraceptives: Secondary | ICD-10-CM | POA: Insufficient documentation

## 2014-06-14 DIAGNOSIS — Z792 Long term (current) use of antibiotics: Secondary | ICD-10-CM | POA: Insufficient documentation

## 2014-06-14 LAB — POC URINE PREG, ED: PREG TEST UR: NEGATIVE

## 2014-06-14 NOTE — ED Provider Notes (Signed)
CSN: [id number]     Arrival date & time 06/14/14  1907 History   First MD Initiated Contact with Patient 06/14/14 2059     Chief Complaint  Patient presents with  . abd weight gain   . Nausea  . Abdominal Cramping     (Consider location/radiation/quality/duration/timing/severity/associated sxs/prior Treatment) Patient is a 39 y.o. female presenting with vomiting.  Emesis Severity:  Mild Duration:  1 month Timing:  Intermittent Progression:  Unchanged Recent urination:  Normal Relieved by:  Nothing Worsened by:  Nothing tried Associated symptoms: no abdominal pain, no cough, no diarrhea and no URI     History reviewed. No pertinent past medical history. Past Surgical History  Procedure Laterality Date  . Tubal ligation    . Hernia repair    . Cesarean section    . Cesarean section     History reviewed. No pertinent family history. History  Substance Use Topics  . Smoking status: Current Every Day Smoker  . Smokeless tobacco: Never Used  . Alcohol Use: Yes     Comment: social   OB History    Gravida Para Term Preterm AB TAB SAB Ectopic Multiple Living   8 4 3 1 4 2 2  0 1 5     Review of Systems  Gastrointestinal: Positive for vomiting. Negative for abdominal pain and diarrhea.  All other systems reviewed and are negative.     Allergies  Latex  Home Medications   Prior to Admission medications   Medication Sig Start Date End Date Taking? Authorizing Provider  doxylamine, Sleep, (UNISOM) 25 MG tablet Take 25 mg by mouth at bedtime as needed for sleep.   Yes Historical Provider, MD  levonorgestrel (MIRENA) 20 MCG/24HR IUD 1 each by Intrauterine route once.   Yes Historical Provider, MD  clindamycin (CLEOCIN) 300 MG capsule TAKE 1 CAPSULE THREE TIMES A DAY Patient not taking: Reported on 06/14/2014 04/19/14   Shelly Bombard, MD  doxycycline (VIBRAMYCIN) 100 MG capsule Take 1 capsule (100 mg total) by mouth 2 (two) times daily. Patient not taking: Reported on  06/14/2014 11/18/13   Shelly Bombard, MD  fluconazole (DIFLUCAN) 150 MG tablet Take 1 tablet (150 mg total) by mouth once. Patient not taking: Reported on 06/14/2014 02/16/14   Shelly Bombard, MD  ibuprofen (ADVIL,MOTRIN) 800 MG tablet Take 1 tablet (800 mg total) by mouth every 8 (eight) hours as needed for mild pain or moderate pain. Patient not taking: Reported on 06/14/2014 08/31/13   Shelly Bombard, MD  levonorgestrel-ethinyl estradiol (NORDETTE) 0.15-30 MG-MCG tablet Take 1 tablet by mouth daily. Patient not taking: Reported on 06/14/2014 02/16/14   Shelly Bombard, MD  metroNIDAZOLE (FLAGYL) 500 MG tablet Take 1 tablet (500 mg total) by mouth 2 (two) times daily. Patient not taking: Reported on 06/14/2014 11/18/13   Shelly Bombard, MD  nitrofurantoin, macrocrystal-monohydrate, (MACROBID) 100 MG capsule Take 1 capsule (100 mg total) by mouth 2 (two) times daily. 1 po BID x 7days Patient not taking: Reported on 06/14/2014 02/16/14   Shelly Bombard, MD  Oxycodone HCl 10 MG TABS Take 1 tablet (10 mg total) by mouth every 6 (six) hours as needed. Patient not taking: Reported on 06/14/2014 11/16/13   Shelly Bombard, MD  oxyCODONE-acetaminophen (ROXICET) 5-325 MG per tablet Take 1-2 tablets by mouth every 4 (four) hours as needed for severe pain. Patient not taking: Reported on 06/14/2014 01/17/14   Threasa Beards, MD  penicillin v potassium (VEETID) 500  MG tablet Take 1 tablet (500 mg total) by mouth 3 (three) times daily. Patient not taking: Reported on 06/14/2014 01/17/14   Threasa Beards, MD   BP 113/72 mmHg  Pulse 99  Temp(Src) 97.8 F (36.6 C) (Oral)  Resp 18  SpO2 100%  LMP 11/11/2013 Physical Exam  Constitutional: She is oriented to person, place, and time. She appears well-developed and well-nourished.  HENT:  Head: Normocephalic and atraumatic.  Right Ear: External ear normal.  Left Ear: External ear normal.  Eyes: Conjunctivae and EOM are normal. Pupils are equal, round, and  reactive to light.  Neck: Normal range of motion. Neck supple.  Cardiovascular: Normal rate, regular rhythm, normal heart sounds and intact distal pulses.   Pulmonary/Chest: Effort normal and breath sounds normal.  Abdominal: Soft. Bowel sounds are normal. There is no tenderness.  Musculoskeletal: Normal range of motion.  Neurological: She is alert and oriented to person, place, and time.  Skin: Skin is warm and dry.  Vitals reviewed.   ED Course  Procedures (including critical care time) Labs Review Labs Reviewed  POC URINE PREG, ED  I-STAT BETA HCG BLOOD, ED ([facility], [facility], [facility] ONLY)    Imaging Review No results found.   EKG Interpretation None      MDM   Final diagnoses:  Non-intractable vomiting with nausea, vomiting of unspecified type    39 y.o. female without pertinent PMH presents with concerns that she is pregnant. She states that she has been nauseous, intermittently vomiting, feels that her abdomen is swollen. She states that these are the symptoms that she has had in the past and she is pregnant. She has taken numerous negative pregnancy test. She also has had intermittent vaginal bleeding for which she had an IUD placed and was intimately tried on by mouth contraceptives.  Her vaginal bleeding ceased 2 days ago. She does not endorse any lightheadedness or exertional symptoms. She denies vaginal discharge otherwise.  On arrival vital signs and physical exam as above. No abdominal tenderness. Urine pregnancy test negative. Patient is adamant that her sister had a negative urine pregnancy test but a positive blood pregnancy test. Will obtain point-of-care hCG.  Pt left prior to receiving this blood test or informing staff.  DC AMA.    I have reviewed all laboratory and imaging studies if ordered as above  1. Non-intractable vomiting with nausea, vomiting of unspecified type         Debby Freiberg, MD 06/14/14 2320

## 2014-06-14 NOTE — ED Notes (Addendum)
Writer went to go draw blood, pt no where to be found, notified RN

## 2014-06-14 NOTE — ED Notes (Addendum)
Tech went to draw pt blood and noticed pt had left AMA. MD aware.

## 2014-06-14 NOTE — ED Notes (Signed)
Pt states that she has tubal ligation, IUD but having weight just in abd, nausea, abd cramps. Pt states that she has taken two pregnancy test one very faint and one negative.  Pt wanting evaluation why she is having these symptoms.

## 2014-06-26 ENCOUNTER — Telehealth: Payer: Self-pay | Admitting: *Deleted

## 2014-06-26 NOTE — Telephone Encounter (Signed)
11:25 Called patient she is not at a place where she can openly discuss her problem- but states she needs to see Dr Jodi Mourning. Patient was seen at hospital ED and she left before her full evaluation. Patient just keeps saying there is a lot going on and she needs to see Dr Jodi Mourning. Patient has an IUD in place and negative UPT. Appointment given.

## 2014-06-29 ENCOUNTER — Encounter: Payer: Self-pay | Admitting: Obstetrics

## 2014-06-29 ENCOUNTER — Ambulatory Visit: Payer: Self-pay | Admitting: Obstetrics

## 2014-06-29 ENCOUNTER — Ambulatory Visit (INDEPENDENT_AMBULATORY_CARE_PROVIDER_SITE_OTHER): Payer: Self-pay | Admitting: Obstetrics

## 2014-06-29 VITALS — BP 115/83 | HR 108 | Temp 98.1°F | Ht 62.0 in | Wt 158.0 lb

## 2014-06-29 DIAGNOSIS — Z30432 Encounter for removal of intrauterine contraceptive device: Secondary | ICD-10-CM

## 2014-06-29 DIAGNOSIS — N939 Abnormal uterine and vaginal bleeding, unspecified: Secondary | ICD-10-CM

## 2014-06-29 DIAGNOSIS — N946 Dysmenorrhea, unspecified: Secondary | ICD-10-CM

## 2014-06-29 LAB — POCT URINE PREGNANCY: PREG TEST UR: NEGATIVE

## 2014-06-30 ENCOUNTER — Encounter: Payer: Self-pay | Admitting: Obstetrics

## 2014-06-30 NOTE — Progress Notes (Signed)
Patient ID: Christina Valenzuela, female   DOB: 02/02/1976, 39 y.o.   MRN: [id number]  Chief Complaint  Patient presents with  . Problem    HPI Christina Valenzuela is a 39 y.o. female.  Constant vaginal bleeding since IUD insertion.  Also moderate cramping.       " Feels " like IUD is out of place.  Wants IUD removed.    HPI  History reviewed. No pertinent past medical history.  Past Surgical History  Procedure Laterality Date  . Tubal ligation    . Hernia repair    . Cesarean section    . Cesarean section      History reviewed. No pertinent family history.  Social History History  Substance Use Topics  . Smoking status: Current Every Day Smoker  . Smokeless tobacco: Never Used  . Alcohol Use: Yes     Comment: social    Allergies  Allergen Reactions  . Latex Swelling    Vaginal swelling    Current Outpatient Prescriptions  Medication Sig Dispense Refill  . ibuprofen (ADVIL,MOTRIN) 800 MG tablet Take 1 tablet (800 mg total) by mouth every 8 (eight) hours as needed for mild pain or moderate pain. 30 tablet 11  . levonorgestrel (MIRENA) 20 MCG/24HR IUD 1 each by Intrauterine route once.     No current facility-administered medications for this visit.    Review of Systems Review of Systems Constitutional: negative for fatigue and weight loss Respiratory: negative for cough and wheezing Cardiovascular: negative for chest pain, fatigue and palpitations Gastrointestinal: negative for abdominal pain and change in bowel habits Genitourinary:negative Integument/breast: negative for nipple discharge Musculoskeletal:negative for myalgias Neurological: negative for gait problems and tremors Behavioral/Psych: negative for abusive relationship, depression Endocrine: negative for temperature intolerance     Blood pressure 115/83, pulse 108, temperature 98.1 F (36.7 C), height 5\' 2"  (1.575 m), weight 158 lb (71.668 kg), last menstrual period 11/11/2013.  Physical Exam Physical  Exam General:   alert  Skin:   no rash or abnormalities  Lungs:   clear to auscultation bilaterally  Heart:   regular rate and rhythm, S1, S2 normal, no murmur, click, rub or gallop  Breasts:   normal without suspicious masses, skin or nipple changes or axillary nodes  Abdomen:  normal findings: no organomegaly, soft, non-tender and no hernia  Pelvis:  External genitalia: normal general appearance Urinary system: urethral meatus normal and bladder without fullness, nontender Vaginal: normal without tenderness, induration or masses Cervix: normal appearance Adnexa: normal bimanual exam Uterus: anteverted and non-tender, normal size      Data Reviewed Labs  Assessment     AUB with Mirena IUD Dysmenorrhea     Plan    IUD Removal F/U 3 months.   Orders Placed This Encounter  Procedures  . SureSwab, Vaginosis/Vaginitis Plus  . POCT urine pregnancy   No orders of the defined types were placed in this encounter.

## 2014-07-03 LAB — SURESWAB, VAGINOSIS/VAGINITIS PLUS
Atopobium vaginae: 6.6 Log (cells/mL)
BV CATEGORY: UNDETERMINED — AB
C. ALBICANS, DNA: NOT DETECTED
C. GLABRATA, DNA: NOT DETECTED
C. TROPICALIS, DNA: NOT DETECTED
C. parapsilosis, DNA: NOT DETECTED
C. trachomatis RNA, TMA: NOT DETECTED
Gardnerella vaginalis: 7.9 Log (cells/mL)
LACTOBACILLUS SPECIES: 5.6 Log (cells/mL)
MEGASPHAERA SPECIES: NOT DETECTED Log (cells/mL)
N. gonorrhoeae RNA, TMA: NOT DETECTED
T. vaginalis RNA, QL TMA: NOT DETECTED

## 2014-07-04 ENCOUNTER — Telehealth: Payer: Self-pay | Admitting: *Deleted

## 2014-07-04 NOTE — Telephone Encounter (Signed)
Patient called for lab results 10:25 LM on VM- labs results normal

## 2014-08-29 ENCOUNTER — Ambulatory Visit: Payer: BC Managed Care – PPO | Admitting: Obstetrics

## 2014-09-28 ENCOUNTER — Ambulatory Visit: Payer: Self-pay | Admitting: Obstetrics

## 2015-05-30 ENCOUNTER — Ambulatory Visit (INDEPENDENT_AMBULATORY_CARE_PROVIDER_SITE_OTHER): Payer: BLUE CROSS/BLUE SHIELD | Admitting: Obstetrics

## 2015-05-30 ENCOUNTER — Encounter: Payer: Self-pay | Admitting: Obstetrics

## 2015-05-30 VITALS — BP 114/77 | HR 96 | Temp 98.5°F | Ht 62.0 in | Wt 180.0 lb

## 2015-05-30 DIAGNOSIS — Z01419 Encounter for gynecological examination (general) (routine) without abnormal findings: Secondary | ICD-10-CM

## 2015-05-30 DIAGNOSIS — N939 Abnormal uterine and vaginal bleeding, unspecified: Secondary | ICD-10-CM

## 2015-05-30 DIAGNOSIS — N946 Dysmenorrhea, unspecified: Secondary | ICD-10-CM

## 2015-05-30 DIAGNOSIS — Z1239 Encounter for other screening for malignant neoplasm of breast: Secondary | ICD-10-CM

## 2015-05-30 LAB — CBC WITH DIFFERENTIAL/PLATELET
BASOS PCT: 1 % (ref 0–1)
Basophils Absolute: 0.1 10*3/uL (ref 0.0–0.1)
Eosinophils Absolute: 0.3 10*3/uL (ref 0.0–0.7)
Eosinophils Relative: 4 % (ref 0–5)
HCT: 26.5 % — ABNORMAL LOW (ref 36.0–46.0)
Hemoglobin: 7.1 g/dL — ABNORMAL LOW (ref 12.0–15.0)
LYMPHS ABS: 3.2 10*3/uL (ref 0.7–4.0)
Lymphocytes Relative: 42 % (ref 12–46)
MCH: 15.6 pg — AB (ref 26.0–34.0)
MCHC: 26.8 g/dL — AB (ref 30.0–36.0)
MCV: 58.2 fL — AB (ref 78.0–100.0)
MONO ABS: 0.7 10*3/uL (ref 0.1–1.0)
MONOS PCT: 9 % (ref 3–12)
MPV: 8.4 fL — ABNORMAL LOW (ref 8.6–12.4)
NEUTROS PCT: 44 % (ref 43–77)
Neutro Abs: 3.4 10*3/uL (ref 1.7–7.7)
Platelets: 447 10*3/uL — ABNORMAL HIGH (ref 150–400)
RBC: 4.55 MIL/uL (ref 3.87–5.11)
RDW: 20.4 % — ABNORMAL HIGH (ref 11.5–15.5)
WBC: 7.7 10*3/uL (ref 4.0–10.5)

## 2015-05-30 LAB — COMPREHENSIVE METABOLIC PANEL
ALBUMIN: 3.9 g/dL (ref 3.6–5.1)
ALT: 18 U/L (ref 6–29)
AST: 20 U/L (ref 10–30)
Alkaline Phosphatase: 52 U/L (ref 33–115)
BUN: 8 mg/dL (ref 7–25)
CHLORIDE: 103 mmol/L (ref 98–110)
CO2: 25 mmol/L (ref 20–31)
Calcium: 9.1 mg/dL (ref 8.6–10.2)
Creat: 0.73 mg/dL (ref 0.50–1.10)
Glucose, Bld: 76 mg/dL (ref 65–99)
POTASSIUM: 4.1 mmol/L (ref 3.5–5.3)
SODIUM: 135 mmol/L (ref 135–146)
TOTAL PROTEIN: 7.4 g/dL (ref 6.1–8.1)
Total Bilirubin: 0.4 mg/dL (ref 0.2–1.2)

## 2015-05-30 LAB — TSH: TSH: 1.331 u[IU]/mL (ref 0.350–4.500)

## 2015-05-30 MED ORDER — IBUPROFEN 800 MG PO TABS: 800.0000 mg | ORAL_TABLET | Freq: Three times a day (TID) | ORAL | Status: DC | PRN

## 2015-05-30 MED ORDER — HYDROCODONE-IBUPROFEN 7.5-200 MG PO TABS: 1.0000 | ORAL_TABLET | Freq: Three times a day (TID) | ORAL | Status: DC | PRN

## 2015-05-30 MED ORDER — PRENATAL 19 29-1 MG PO TABS: 1.0000 | ORAL_TABLET | Freq: Every day | ORAL | Status: DC

## 2015-05-30 NOTE — Addendum Note (Signed)
Addended by: Lewie Loron D on: 05/30/2015 03:01 PM   Modules accepted: Orders

## 2015-05-30 NOTE — Progress Notes (Signed)
Subjective:        Christina Valenzuela is a 40 y.o. female here for a routine exam.  Current complaints: Painful, heavy periods with clots, lasting 5-7 days, sometimes longer.  H/O fibroids.   Personal health questionnaire:  Is patient Ashkenazi Jewish, have a family history of breast and/or ovarian cancer: no Is there a family history of uterine cancer diagnosed at age < 72, gastrointestinal cancer, urinary tract cancer, family member who is a Field seismologist syndrome-associated carrier: no Is the patient overweight and hypertensive, family history of diabetes, personal history of gestational diabetes, preeclampsia or PCOS: no Is patient over 60, have PCOS,  family history of premature CHD under age 61, diabetes, smoke, have hypertension or peripheral artery disease:  no At any time, has a partner hit, kicked or otherwise hurt or frightened you?: no Over the past 2 weeks, have you felt down, depressed or hopeless?: no Over the past 2 weeks, have you felt little interest or pleasure in doing things?:no   Gynecologic History Patient's last menstrual period was 05/15/2015 (exact date). Contraception: tubal ligation Last Pap: 2015. Results were: abnormal ( ASCUS ) Last mammogram: none. Results were: n/a  Obstetric History OB History  Gravida Para Term Preterm AB SAB TAB Ectopic Multiple Living  8 4 3 1 4 2 2  0 1 5    # Outcome Date GA Lbr Len/2nd Weight Sex Delivery Anes PTL Lv  8 Term 11/14/06 [redacted]w[redacted]d   F CS-LTranv EPI  Y  7 Term 02/02/04 [redacted]w[redacted]d  6 lb (2.722 kg) M VBAC EPI  Y  6 Term 04/27/92 [redacted]w[redacted]d   M Vag-Spont EPI  Y  5 TAB         N  4 TAB         N  3 SAB         N  2 SAB         N  1A Preterm 08/04/96 [redacted]w[redacted]d  1 lb 4 oz (0.567 kg) F CS-LTranv EPI  Y  1B Preterm    1 lb 5 oz (0.595 kg) F CS-LTranv EPI  Y      History reviewed. No pertinent past medical history.  Past Surgical History  Procedure Laterality Date  . Tubal ligation    . Hernia repair    . Cesarean section    . Cesarean  section       Current outpatient prescriptions:  .  ibuprofen (ADVIL,MOTRIN) 800 MG tablet, Take 1 tablet (800 mg total) by mouth every 8 (eight) hours as needed for mild pain or cramping., Disp: 40 tablet, Rfl: 6 .  HYDROcodone-ibuprofen (VICOPROFEN) 7.5-200 MG tablet, Take 1 tablet by mouth every 8 (eight) hours as needed for moderate pain., Disp: 40 tablet, Rfl: 0 .  Prenatal Vit-DSS-Fe Fum-FA (PRENATAL 19) 29-1 MG TABS, Take 1 tablet by mouth daily before breakfast., Disp: 30 tablet, Rfl: 11 Allergies  Allergen Reactions  . Latex Swelling    Vaginal swelling    Social History  Substance Use Topics  . Smoking status: Current Every Day Smoker -- 0.25 packs/day    Types: Cigarettes  . Smokeless tobacco: Never Used  . Alcohol Use: 0.0 oz/week    0 Standard drinks or equivalent per week     Comment: social    History reviewed. No pertinent family history.    Review of Systems  Constitutional: negative for fatigue and weight loss Respiratory: negative for cough and wheezing Cardiovascular: negative for chest pain, fatigue and  palpitations Gastrointestinal: negative for abdominal pain and change in bowel habits Musculoskeletal:negative for myalgias Neurological: negative for gait problems and tremors Behavioral/Psych: negative for abusive relationship, depression Endocrine: negative for temperature intolerance   Genitourinary: positive for abnormal menstrual periods. Negative for genital lesions, hot flashes, sexual problems and vaginal discharge Integument/breast: negative for breast lump, breast tenderness, nipple discharge and skin lesion(s)    Objective:       BP 114/77 mmHg  Pulse 96  Temp(Src) 98.5 F (36.9 C)  Ht 5\' 2"  (1.575 m)  Wt 180 lb (81.647 kg)  BMI 32.91 kg/m2  LMP 05/15/2015 (Exact Date) General:   alert  Skin:   no rash or abnormalities  Lungs:   clear to auscultation bilaterally  Heart:   regular rate and rhythm, S1, S2 normal, no murmur, click, rub  or gallop  Breasts:   normal without suspicious masses, skin or nipple changes or axillary nodes  Abdomen:  normal findings: no organomegaly, soft, non-tender and no hernia  Pelvis:  External genitalia: normal general appearance Urinary system: urethral meatus normal and bladder without fullness, nontender Vaginal: normal without tenderness, induration or masses Cervix: normal appearance Adnexa: normal bimanual exam Uterus: anteverted and non-tender, normal size   Lab Review Urine pregnancy test Labs reviewed yes Radiologic studies reviewed yes    Assessment:    Healthy female exam.   AUB - hormonal imbalance Dysmenorrhea - severe Leiomyoma   Plan:   Considering Endometrial Ablation Ibuprofen Rx along with Vicoprofen prn Ultrasound ordered to reevaluate fibroids Baseline mammogram ordered    Education reviewed: calcium supplements, low fat, low cholesterol diet, safe sex/STD prevention, self breast exams, smoking cessation and weight bearing exercise. Mammogram ordered. Follow up in: 2 weeks.   Meds ordered this encounter  Medications  . HYDROcodone-ibuprofen (VICOPROFEN) 7.5-200 MG tablet    Sig: Take 1 tablet by mouth every 8 (eight) hours as needed for moderate pain.    Dispense:  40 tablet    Refill:  0  . ibuprofen (ADVIL,MOTRIN) 800 MG tablet    Sig: Take 1 tablet (800 mg total) by mouth every 8 (eight) hours as needed for mild pain or cramping.    Dispense:  40 tablet    Refill:  6  . Prenatal Vit-DSS-Fe Fum-FA (PRENATAL 19) 29-1 MG TABS    Sig: Take 1 tablet by mouth daily before breakfast.    Dispense:  30 tablet    Refill:  11   Orders Placed This Encounter  Procedures  . US Transvaginal Non-OB    Standing Status: Future     Number of Occurrences:      Standing Expiration Date: 07/27/2016    Order Specific Question:  Reason for Exam (SYMPTOM  OR DIAGNOSIS REQUIRED)    Answer:  AUB    Order Specific Question:  Preferred imaging location?    Answer:   Internal  . US Pelvis Complete    Standing Status: Future     Number of Occurrences:      Standing Expiration Date: 07/27/2016    Order Specific Question:  Reason for Exam (SYMPTOM  OR DIAGNOSIS REQUIRED)    Answer:  AUB    Order Specific Question:  Preferred imaging location?    Answer:  Internal  . MM Digital Screening    Standing Status: Future     Number of Occurrences:      Standing Expiration Date: 07/27/2016    Order Specific Question:  Reason for Exam (SYMPTOM  OR DIAGNOSIS REQUIRED)  Answer:  Baseline screening    Order Specific Question:  Is the patient pregnant?    Answer:  No    Order Specific Question:  Preferred imaging location?    Answer:  [facility]  . Comprehensive metabolic panel  . TSH  . CBC

## 2015-06-01 LAB — PAP, TP IMAGING W/ HPV RNA, RFLX HPV TYPE 16,18/45: HPV MRNA, HIGH RISK: NOT DETECTED

## 2015-06-02 LAB — SURESWAB, VAGINOSIS/VAGINITIS PLUS
ATOPOBIUM VAGINAE: NOT DETECTED Log (cells/mL)
C. PARAPSILOSIS, DNA: NOT DETECTED
C. albicans, DNA: NOT DETECTED
C. glabrata, DNA: NOT DETECTED
C. trachomatis RNA, TMA: NOT DETECTED
C. tropicalis, DNA: NOT DETECTED
GARDNERELLA VAGINALIS: 5 Log (cells/mL)
LACTOBACILLUS SPECIES: 7.6 Log (cells/mL)
MEGASPHAERA SPECIES: NOT DETECTED Log (cells/mL)
N. gonorrhoeae RNA, TMA: NOT DETECTED
T. VAGINALIS RNA, QL TMA: NOT DETECTED

## 2015-06-08 ENCOUNTER — Ambulatory Visit
Admission: RE | Admit: 2015-06-08 | Discharge: 2015-06-08 | Disposition: A | Discharge status: Home / Self Care | Payer: BLUE CROSS/BLUE SHIELD | Source: Ambulatory Visit | Attending: Obstetrics | Admitting: Obstetrics

## 2015-06-08 DIAGNOSIS — Z1239 Encounter for other screening for malignant neoplasm of breast: Secondary | ICD-10-CM

## 2015-06-11 ENCOUNTER — Other Ambulatory Visit: Payer: Self-pay | Admitting: Obstetrics

## 2015-06-11 DIAGNOSIS — R928 Other abnormal and inconclusive findings on diagnostic imaging of breast: Secondary | ICD-10-CM

## 2015-06-18 ENCOUNTER — Other Ambulatory Visit: Payer: Self-pay | Admitting: Obstetrics

## 2015-06-20 ENCOUNTER — Other Ambulatory Visit: Payer: BLUE CROSS/BLUE SHIELD

## 2015-06-20 ENCOUNTER — Ambulatory Visit: Payer: BLUE CROSS/BLUE SHIELD | Admitting: Obstetrics

## 2015-06-21 ENCOUNTER — Ambulatory Visit: Payer: BLUE CROSS/BLUE SHIELD | Admitting: Obstetrics

## 2015-06-28 ENCOUNTER — Other Ambulatory Visit: Payer: BLUE CROSS/BLUE SHIELD

## 2015-06-28 ENCOUNTER — Ambulatory Visit: Payer: BLUE CROSS/BLUE SHIELD | Admitting: Obstetrics

## 2015-06-29 ENCOUNTER — Ambulatory Visit
Admission: RE | Admit: 2015-06-29 | Discharge: 2015-06-29 | Disposition: A | Discharge status: Home / Self Care | Payer: BLUE CROSS/BLUE SHIELD | Source: Ambulatory Visit | Attending: Obstetrics | Admitting: Obstetrics

## 2015-06-29 DIAGNOSIS — R928 Other abnormal and inconclusive findings on diagnostic imaging of breast: Secondary | ICD-10-CM

## 2015-07-06 ENCOUNTER — Ambulatory Visit (HOSPITAL_COMMUNITY)
Admission: RE | Admit: 2015-07-06 | Discharge: 2015-07-06 | Disposition: A | Discharge status: Home / Self Care | Payer: BLUE CROSS/BLUE SHIELD | Source: Ambulatory Visit | Attending: Obstetrics | Admitting: Obstetrics

## 2015-07-06 DIAGNOSIS — N939 Abnormal uterine and vaginal bleeding, unspecified: Secondary | ICD-10-CM

## 2015-07-06 DIAGNOSIS — N920 Excessive and frequent menstruation with regular cycle: Secondary | ICD-10-CM | POA: Diagnosis not present

## 2015-07-06 DIAGNOSIS — D259 Leiomyoma of uterus, unspecified: Secondary | ICD-10-CM | POA: Insufficient documentation

## 2015-07-06 DIAGNOSIS — N946 Dysmenorrhea, unspecified: Secondary | ICD-10-CM | POA: Diagnosis present

## 2015-08-15 ENCOUNTER — Other Ambulatory Visit: Payer: Self-pay | Admitting: Obstetrics

## 2015-08-15 NOTE — Telephone Encounter (Signed)
Please review for refill.  

## 2015-08-17 ENCOUNTER — Encounter (HOSPITAL_COMMUNITY): Payer: Self-pay | Admitting: Emergency Medicine

## 2015-08-17 ENCOUNTER — Emergency Department (HOSPITAL_COMMUNITY)
Admission: EM | Admit: 2015-08-17 | Discharge: 2015-08-17 | Disposition: A | Discharge status: Home / Self Care | Payer: BLUE CROSS/BLUE SHIELD | Attending: Emergency Medicine | Admitting: Emergency Medicine

## 2015-08-17 DIAGNOSIS — F17210 Nicotine dependence, cigarettes, uncomplicated: Secondary | ICD-10-CM | POA: Diagnosis not present

## 2015-08-17 DIAGNOSIS — R109 Unspecified abdominal pain: Secondary | ICD-10-CM | POA: Insufficient documentation

## 2015-08-17 LAB — COMPREHENSIVE METABOLIC PANEL
ALK PHOS: 56 U/L (ref 38–126)
ALT: 27 U/L (ref 14–54)
ANION GAP: 12 (ref 5–15)
AST: 28 U/L (ref 15–41)
Albumin: 3.7 g/dL (ref 3.5–5.0)
BUN: 10 mg/dL (ref 6–20)
CALCIUM: 9.4 mg/dL (ref 8.9–10.3)
CO2: 18 mmol/L — AB (ref 22–32)
CREATININE: 0.87 mg/dL (ref 0.44–1.00)
Chloride: 108 mmol/L (ref 101–111)
GFR calc non Af Amer: >60 mL/min (ref >60)
GLUCOSE: 116 mg/dL — AB (ref 65–99)
Potassium: 3.5 mmol/L (ref 3.5–5.1)
SODIUM: 138 mmol/L (ref 135–145)
TOTAL PROTEIN: 7.6 g/dL (ref 6.5–8.1)
Total Bilirubin: 0.2 mg/dL — ABNORMAL LOW (ref 0.3–1.2)

## 2015-08-17 LAB — URINALYSIS, ROUTINE W REFLEX MICROSCOPIC
Bilirubin Urine: NEGATIVE
Glucose, UA: NEGATIVE mg/dL
Hgb urine dipstick: NEGATIVE
KETONES UR: NEGATIVE mg/dL
LEUKOCYTES UA: NEGATIVE
NITRITE: NEGATIVE
PROTEIN: NEGATIVE mg/dL
Specific Gravity, Urine: 1.021 (ref 1.005–1.030)
pH: 6 (ref 5.0–8.0)

## 2015-08-17 LAB — CBC
HCT: 26.8 % — ABNORMAL LOW (ref 36.0–46.0)
Hemoglobin: 7 g/dL — ABNORMAL LOW (ref 12.0–15.0)
MCH: 15.3 pg — ABNORMAL LOW (ref 26.0–34.0)
MCHC: 26.1 g/dL — ABNORMAL LOW (ref 30.0–36.0)
MCV: 58.5 fL — ABNORMAL LOW (ref 78.0–100.0)
PLATELETS: 494 10*3/uL — AB (ref 150–400)
RBC: 4.58 MIL/uL (ref 3.87–5.11)
RDW: 21.6 % — AB (ref 11.5–15.5)
WBC: 10.9 10*3/uL — ABNORMAL HIGH (ref 4.0–10.5)

## 2015-08-17 LAB — LIPASE, BLOOD: Lipase: 23 U/L (ref 11–51)

## 2015-08-17 LAB — POC URINE PREG, ED: PREG TEST UR: NEGATIVE

## 2015-08-17 NOTE — ED Notes (Signed)
Pt. reports low abdominal pain with nausea and emesis onset this evening , denies diarrhea , no fever or chills.

## 2015-08-22 DIAGNOSIS — R51 Headache: Secondary | ICD-10-CM

## 2015-08-22 DIAGNOSIS — R519 Headache, unspecified: Secondary | ICD-10-CM | POA: Insufficient documentation

## 2015-08-22 DIAGNOSIS — F1010 Alcohol abuse, uncomplicated: Secondary | ICD-10-CM | POA: Insufficient documentation

## 2015-08-22 DIAGNOSIS — F5101 Primary insomnia: Secondary | ICD-10-CM | POA: Insufficient documentation

## 2015-08-22 DIAGNOSIS — F418 Other specified anxiety disorders: Secondary | ICD-10-CM | POA: Insufficient documentation

## 2015-09-03 ENCOUNTER — Ambulatory Visit (INDEPENDENT_AMBULATORY_CARE_PROVIDER_SITE_OTHER): Payer: BLUE CROSS/BLUE SHIELD | Admitting: Neurology

## 2015-09-03 ENCOUNTER — Encounter: Payer: Self-pay | Admitting: Neurology

## 2015-09-03 VITALS — BP 110/82 | HR 88 | Resp 16 | Ht 62.0 in | Wt 175.0 lb

## 2015-09-03 DIAGNOSIS — G4761 Periodic limb movement disorder: Secondary | ICD-10-CM | POA: Diagnosis not present

## 2015-09-03 DIAGNOSIS — E669 Obesity, unspecified: Secondary | ICD-10-CM

## 2015-09-03 DIAGNOSIS — R51 Headache: Secondary | ICD-10-CM

## 2015-09-03 DIAGNOSIS — R0681 Apnea, not elsewhere classified: Secondary | ICD-10-CM | POA: Diagnosis not present

## 2015-09-03 DIAGNOSIS — G2581 Restless legs syndrome: Secondary | ICD-10-CM

## 2015-09-03 DIAGNOSIS — R351 Nocturia: Secondary | ICD-10-CM | POA: Diagnosis not present

## 2015-09-03 DIAGNOSIS — R0683 Snoring: Secondary | ICD-10-CM

## 2015-09-03 DIAGNOSIS — G4710 Hypersomnia, unspecified: Secondary | ICD-10-CM

## 2015-09-03 DIAGNOSIS — R519 Headache, unspecified: Secondary | ICD-10-CM

## 2015-09-03 NOTE — Patient Instructions (Signed)

## 2015-09-03 NOTE — Progress Notes (Signed)
Subjective:    Patient ID: Christina Valenzuela is a 40 y.o. female.  HPI     Star Age, MD, PhD [facility] [address], [address] [place], [place] [address]   Dear Neoma Laming,   I saw your patient, Christina Valenzuela, upon your kind request in my neurologic clinic today for initial consultation of her sleep disorder, concern for underlying obstructive sleep apnea. The patient is unaccompanied today. As you know, Christina Valenzuela is a 40 year old right-handed woman with an underlying medical history of excessive alcohol use, depression, anxiety, migraines, smoking, and obesity, who reports snoring and excessive daytime somnolence and difficulty with initiation and maintenance of sleep. She has tried Ambien in the past, she has been on over-the-counter Benadryl and Unisom with limited success. I reviewed your office note from 08/22/2015, which you kindly included. Blood work was ordered at the time including TSH, CBC with differential, CMP. She has been on Prozac generic.  She snores, and snoring can be loud. She has had witnessed apneic pauses per husband and reports morning headaches. Currently, she takes Advil PM, 2 pills, most nights. When she was pregnant, she tried Ambien with good results.  She has OSA in her family in her MGM and maternal great uncle. Both have CPAP machines. Her main concern is difficulty initiating and maintaining sleep. She does endorse restless leg symptoms and her husband says that she wiggles her feet and legs while in bed and while asleep. She works for [employer]. she lives with her husband and 4 children. Her oldest is 75 and on her own. The patient smokes about 6 cigarettes per day, drinks occasional alcohol, denies illicit drug use, drinks caffeine in the form of [facility] and likes to drink orange juice, not so much water. Bedtime is around 11 but she may not fall asleep until 1 and sometimes 2 AM. Wakeup time is around 6 AM but she goes back to bed  between 10 and 11, she has to be at work from 1 to 10 PM. She wakes up with a headache, to 3 times per week him on not enough to take medication. She has nocturia about 3-4 times on an average night.  Her Past Medical History Is Significant For: Past Medical History  Diagnosis Date  . Depression     Her Past Surgical History Is Significant For: Past Surgical History  Procedure Laterality Date  . Tubal ligation    . Hernia repair    . Cesarean section    . Cesarean section      Her Family History Is Significant For: Family History  Problem Relation Age of Onset  . Diabetes Maternal Grandfather     Her Social History Is Significant For: Social History   Social History  . Marital Status: Married    Spouse Name: N/A  . Number of Children: 5  . Years of Education: 79   Social History Main Topics  . Smoking status: Current Every Day Smoker -- 0.25 packs/day    Types: Cigarettes  . Smokeless tobacco: Never Used  . Alcohol Use: 0.0 oz/week    0 Standard drinks or equivalent per week     Comment: social  . Drug Use: No  . Sexual Activity:    Partners: Male    Patent examiner Protection: Surgical     Comment: Tubal Ligation    Other Topics Concern  . None   Social History Narrative   Drinks caffeine     Her  Allergies Are:  Allergies  Allergen Reactions  . Latex Swelling    Vaginal swelling  :   Her Current Medications Are:  Outpatient Encounter Prescriptions as of 09/03/2015  Medication Sig  . FLUoxetine (PROZAC) 20 MG capsule Take 20 mg by mouth.  Marland Kitchen ibuprofen (ADVIL,MOTRIN) 800 MG tablet TAKE 1 TABLET BY MOUTH EVERY 8 HOURS AS NEEDED FOR PAIN  . [DISCONTINUED] fluconazole (DIFLUCAN) 150 MG tablet TAKE 1 TABLET BY MOUTH ONCE  . [DISCONTINUED] HYDROcodone-ibuprofen (VICOPROFEN) 7.5-200 MG tablet Take 1 tablet by mouth every 8 (eight) hours as needed for moderate pain.  . [DISCONTINUED] Prenatal Vit-DSS-Fe Fum-FA (PRENATAL 19) 29-1 MG TABS Take 1 tablet by mouth  daily before breakfast.   No facility-administered encounter medications on file as of 09/03/2015.  :  Review of Systems:  Out of a complete 14 point review of systems, all are reviewed and negative with the exception of these symptoms as listed below:   Review of Systems  Constitutional:       Weight gain   HENT: Positive for rhinorrhea.   Cardiovascular: Positive for chest pain and leg swelling.  Allergic/Immunologic: Positive for environmental allergies.  Neurological: Positive for dizziness, weakness and headaches.       Patient has trouble falling and staying asleep, snoring, witnessed apnea, wakes up feeling tired, morning headaches, daytime tiredness, wakes up choking, takes naps if she can.   Psychiatric/Behavioral:       Depression, not enough sleep, change in appetite   Epworth Sleepiness Scale 0= would never doze 1= slight chance of dozing 2= moderate chance of dozing 3= high chance of dozing  Sitting and reading:0 Watching TV:2 Sitting inactive in a public place (ex. Theater or meeting):0 As a passenger in a car for an hour without a break:0 Lying down to rest in the afternoon:1 Sitting and talking to someone:0 Sitting quietly after lunch (no alcohol):2 In a car, while stopped in traffic:0 Total:5   Objective:  Neurologic Exam  Physical Exam Physical Examination:   Filed Vitals:   09/03/15 1452  BP: 110/82  Pulse: 88  Resp: 16    General Examination: The patient is a very pleasant 40 y.o. female in no acute distress. She appears well-developed and well-nourished and well groomed.   HEENT: Normocephalic, atraumatic, pupils are equal, round and reactive to light and accommodation. Funduscopic exam is normal with sharp disc margins noted. Extraocular tracking is good without limitation to gaze excursion or nystagmus noted. Normal smooth pursuit is noted. Hearing is grossly intact. Tympanic membranes are clear bilaterally. Face is symmetric with normal facial  animation and normal facial sensation. Speech is clear with no dysarthria noted. There is no hypophonia. There is no lip, neck/head, jaw or voice tremor. Neck is supple with full range of passive and active motion. There are no carotid bruits on auscultation. Oropharynx exam reveals: moderate mouth dryness, adequate dental hygiene and moderate airway crowding, due to smaller airway entry and thicker soft palate, tonsils in place. Mallampati is class II. Tongue protrudes centrally and palate elevates symmetrically. Tonsils are 1+ in size. Neck size is 15.75 inches. She has a Moderate overbite.    Chest: Clear to auscultation without wheezing, rhonchi or crackles noted.  Heart: S1+S2+0, regular and normal without murmurs, rubs or gallops noted.   Abdomen: Soft, non-tender and non-distended with normal bowel sounds appreciated on auscultation.  Extremities: There is no pitting edema in the distal lower extremities bilaterally. Pedal pulses are intact.  Skin: Warm and dry without  trophic changes noted.  Musculoskeletal: exam reveals no obvious joint deformities, tenderness or joint swelling or erythema.   Neurologically:  Mental status: The patient is awake, alert and oriented in all 4 spheres. Her immediate and remote memory, attention, language skills and fund of knowledge are appropriate. There is no evidence of aphasia, agnosia, apraxia or anomia. Speech is clear with normal prosody and enunciation. Thought process is linear. Mood is normal and affect is normal.  Cranial nerves II - XII are as described above under HEENT exam. In addition: shoulder shrug is normal with equal shoulder height noted. Motor exam: Normal bulk, strength and tone is noted. There is no drift, tremor or rebound. Romberg is negative. Reflexes are 2+ throughout. Babinski: Toes are flexor bilaterally. Fine motor skills and coordination: intact with normal finger taps, normal hand movements, normal rapid alternating patting,  normal foot taps and normal foot agility.  Cerebellar testing: No dysmetria or intention tremor on finger to nose testing. Heel to shin is unremarkable bilaterally. There is no truncal or gait ataxia.  Sensory exam: intact to light touch, pinprick, vibration, temperature sense in the upper and lower extremities.  Gait, station and balance: She stands easily. No veering to one side is noted. No leaning to one side is noted. Posture is age-appropriate and stance is narrow based. Gait shows normal stride length and normal pace. No problems turning are noted. She turns en bloc. Tandem walk is unremarkable. Intact toe and heel stance is noted.                Assessment and Plan:   In summary, Christina Valenzuela is a very pleasant 40 y.o.-year old female with an underlying medical history of excessive alcohol use, depression, anxiety, migraines, smoking, and obesity, who reports snoring and excessive daytime somnolence and difficulty with initiation and maintenance of sleep. Her husband has noted apneas. She is anemic and is supposed to follow-up with Gyn and with PCP. Her history and physical exam are concerning for obstructive sleep apnea (OSA). In addition, she has RLS symptoms and her husband reports that she moves her legs and feet in her her sleep.  I had a long chat with the patient and her husband about my findings and the diagnosis of OSA, its prognosis and treatment options. We talked about medical treatments, surgical interventions and non-pharmacological approaches. I explained in particular the risks and ramifications of untreated moderate to severe OSA, especially with respect to developing cardiovascular disease down the Road, including congestive heart failure, difficult to treat hypertension, cardiac arrhythmias, or stroke. Even type 2 diabetes has, in part, been linked to untreated OSA. Symptoms of untreated OSA include daytime sleepiness, memory problems, mood irritability and mood disorder such as  depression and anxiety, lack of energy, as well as recurrent headaches, especially morning headaches. We talked about smoking cessation and trying to maintain a healthy lifestyle in general, as well as the importance of weight control. I encouraged the patient to eat healthy, exercise daily and keep well hydrated, to keep a scheduled bedtime and wake time routine, to not skip any meals and eat healthy snacks in between meals. I advised the patient not to drive when feeling sleepy. I recommended the following at this time: sleep study with potential positive airway pressure titration. (We will score hypopneas at 3% and split the sleep study into diagnostic and treatment portion, if the estimated. 2 hour AHI is >15/h).  We will be on the lookout for PLMS during her sleep  study. She endorses restless leg symptoms, this could be linked to her anemia as well. We will pick up our discussion about restless legs and PLMS after the sleep study again. I explained the sleep test procedure to the patient and also outlined possible surgical and non-surgical treatment options of OSA, including the use of a custom-made dental device (which would require a referral to a specialist dentist or oral surgeon), upper airway surgical options, such as pillar implants, radiofrequency surgery, tongue base surgery, and UPPP (which would involve a referral to an ENT surgeon). Rarely, jaw surgery such as mandibular advancement may be considered.  I also explained the CPAP treatment option to the patient, who indicated that she would be willing to try CPAP if the need arises. I explained the importance of being compliant with PAP treatment, not only for insurance purposes but primarily to improve Her symptoms, and for the patient's long term health benefit, including to reduce Her cardiovascular risks. I answered all their questions today and the patient and her husband were in agreement. I would like to see her back after the sleep study  is completed and encouraged her to call with any interim questions, concerns, problems or updates.   Thank you very much for allowing me to participate in the care of this nice patient. If I can be of any further assistance to you please do not hesitate to call me at [phone].  Sincerely,   Star Age, MD, PhD

## 2015-09-08 DIAGNOSIS — F5089 Other specified eating disorder: Secondary | ICD-10-CM | POA: Insufficient documentation

## 2015-09-08 DIAGNOSIS — D649 Anemia, unspecified: Secondary | ICD-10-CM | POA: Insufficient documentation

## 2015-09-18 ENCOUNTER — Ambulatory Visit: Payer: BLUE CROSS/BLUE SHIELD | Admitting: Obstetrics

## 2015-10-15 ENCOUNTER — Ambulatory Visit: Payer: BLUE CROSS/BLUE SHIELD | Admitting: Obstetrics

## 2015-10-15 ENCOUNTER — Emergency Department (HOSPITAL_COMMUNITY): Payer: BLUE CROSS/BLUE SHIELD

## 2015-10-15 ENCOUNTER — Emergency Department (HOSPITAL_COMMUNITY)
Admission: EM | Admit: 2015-10-15 | Discharge: 2015-10-15 | Disposition: A | Discharge status: Home / Self Care | Payer: BLUE CROSS/BLUE SHIELD | Attending: Emergency Medicine | Admitting: Emergency Medicine

## 2015-10-15 ENCOUNTER — Encounter (HOSPITAL_COMMUNITY): Payer: Self-pay | Admitting: *Deleted

## 2015-10-15 DIAGNOSIS — M25462 Effusion, left knee: Secondary | ICD-10-CM | POA: Insufficient documentation

## 2015-10-15 DIAGNOSIS — Z91040 Latex allergy status: Secondary | ICD-10-CM | POA: Diagnosis not present

## 2015-10-15 DIAGNOSIS — F17210 Nicotine dependence, cigarettes, uncomplicated: Secondary | ICD-10-CM | POA: Insufficient documentation

## 2015-10-15 DIAGNOSIS — F329 Major depressive disorder, single episode, unspecified: Secondary | ICD-10-CM | POA: Insufficient documentation

## 2015-10-15 DIAGNOSIS — M25562 Pain in left knee: Secondary | ICD-10-CM | POA: Diagnosis present

## 2015-10-15 MED ORDER — HYDROCODONE-ACETAMINOPHEN 5-325 MG PO TABS: 2.0000 | ORAL_TABLET | ORAL | Status: DC | PRN

## 2015-10-15 NOTE — ED Notes (Signed)
Pt reports she fell during celebration of chlids graduation on [employer]. Pt reports pain to RT knee.

## 2015-10-15 NOTE — ED Provider Notes (Signed)
History  By signing my name below, I, Christina Valenzuela, attest that this documentation has been prepared under the direction and in the presence of Glendell Docker, [place]. Electronically Signed: Bea Valenzuela, ED Scribe. 10/15/2015. 1:46 PM.  Chief Complaint  Patient presents with  . Knee Pain   The history is provided by the patient and medical records. No language interpreter was used.    HPI Comments:  Christina Valenzuela is a 40 y.o. female who presents to the Emergency Department complaining of left knee pain that began yesterday. She states she was jumping up and down two days ago and thinks she may have landed on it wrong, stating the pain began upon waking the next day. She has not taken anything for pain. Bearing weight and ROM increases the pain. She denies alleviating factors. She denies bruising, wounds, swelling, numbness, tingling or weakness of the left knee or LLE. She denies any previous injury to the left knee.   Past Medical History  Diagnosis Date  . Depression    Past Surgical History  Procedure Laterality Date  . Tubal ligation    . Hernia repair    . Cesarean section    . Cesarean section     Family History  Problem Relation Age of Onset  . Diabetes Maternal Grandfather    Social History  Substance Use Topics  . Smoking status: Current Every Day Smoker -- 0.25 packs/day    Types: Cigarettes  . Smokeless tobacco: Never Used  . Alcohol Use: 0.0 oz/week    0 Standard drinks or equivalent per week     Comment: social   OB History    Gravida Para Term Preterm AB TAB SAB Ectopic Multiple Living   8 4 3 1 4 2 2  0 1 5     Review of Systems  Musculoskeletal: Positive for arthralgias. Negative for joint swelling.  Skin: Negative for color change and wound.  Neurological: Negative for weakness and numbness.  All other systems reviewed and are negative.   Allergies  Latex  Home Medications   Prior to Admission medications   Medication Sig Start Date  End Date Taking? Authorizing Provider  FLUoxetine (PROZAC) 20 MG capsule Take 20 mg by mouth. 08/22/15 08/21/16  Historical Provider, MD  ibuprofen (ADVIL,MOTRIN) 800 MG tablet TAKE 1 TABLET BY MOUTH EVERY 8 HOURS AS NEEDED FOR PAIN 08/15/15   Shelly Bombard, MD   Triage Vitals: BP 109/71 mmHg  Pulse 86  Temp(Src) 98.6 F (37 C) (Oral)  Resp 18  Ht 5\' 2"  (1.575 m)  Wt 174 lb (78.926 kg)  BMI 31.82 kg/m2  SpO2 99% Physical Exam  Constitutional: She is oriented to person, place, and time. She appears well-developed and well-nourished.  HENT:  Head: Normocephalic and atraumatic.  Eyes: EOM are normal.  Neck: Normal range of motion.  Cardiovascular: Normal rate.   Pulmonary/Chest: Effort normal.  Musculoskeletal: Normal range of motion.  Full rom of the left knee. Pulses intact. Mild swelling noted  Neurological: She is alert and oriented to person, place, and time.  Skin: Skin is warm and dry.  Psychiatric: She has a normal mood and affect. Her behavior is normal.  Nursing note and vitals reviewed.   ED Course  Procedures (including critical care time) DIAGNOSTIC STUDIES: Oxygen Saturation is 99% on RA, normal by my interpretation.   COORDINATION OF CARE: 12:06 PM- Will X-Ray left knee. Pt verbalizes understanding and agrees to plan.  Medications - No data to display  Labs Review Labs Reviewed - No data to display  Imaging Review Dg Knee Complete 4 Views Left  10/15/2015  CLINICAL DATA:  Twisted knee.  Pain.  Initial evaluation. EXAM: LEFT KNEE - COMPLETE 4+ VIEW COMPARISON:  No recent prior. FINDINGS: No evidence of prominent fracture or dislocation. Small loose body noted. Small knee joint effusion. IMPRESSION: 1. Small loose body and small knee joint effusion. 2.  No prominent fracture.  No dislocation. Electronically Signed   By: Marcello Moores  Register   On: 10/15/2015 13:37   I have personally reviewed and evaluated these images and lab results as part of my medical  decision-making.   EKG Interpretation None      MDM   Final diagnoses:  Knee effusion, left   Pt placed in knee immobilizer and crutches. Given ortho follow up. Given hydrocodone for pain  I personally performed the services described in this documentation, which was scribed in my presence. The recorded information has been reviewed and is accurate.    Glendell Docker, NP 10/15/15 [place], MD 10/15/15 1646

## 2015-10-15 NOTE — Discharge Instructions (Signed)
Knee Effusion °Knee effusion means that you have extra fluid in your knee. This can cause pain. Your knee may be more difficult to bend and move. °HOME CARE °· Use crutches as told by your doctor. °· Wear a knee brace as told by your doctor. °· Apply ice to the swollen area: °¨ Put ice in a plastic bag. °¨ Place a towel between your skin and the bag. °¨ Leave the ice on for 20 minutes, 2-3 times per day. °· Keep your knee raised (elevated) when you are sitting or lying down. °· Take medicines only as told by your doctor. °· Do any rehabilitation or strengthening exercises as told by your doctor. °· Rest your knee as told by your doctor. You may start doing your normal activities again when your doctor says it is okay. °· Keep all follow-up visits as told by your doctor. This is important. °GET HELP IF:  °· You continue to have pain in your knee. °GET HELP RIGHT AWAY IF: °· You have increased swelling or redness of your knee. °· You have severe pain in your knee. °· You have a fever. °  °This information is not intended to replace advice given to you by your health care provider. Make sure you discuss any questions you have with your health care provider. °  °Document Released: 05/24/2010 Document Revised: 05/12/2014 Document Reviewed: 12/05/2013 °Elsevier Interactive Patient Education ©2016 [employer] Inc[employer] ° °

## 2015-10-25 ENCOUNTER — Encounter: Payer: Self-pay | Admitting: Obstetrics

## 2015-10-25 ENCOUNTER — Ambulatory Visit (INDEPENDENT_AMBULATORY_CARE_PROVIDER_SITE_OTHER): Payer: BLUE CROSS/BLUE SHIELD | Admitting: Obstetrics

## 2015-10-25 VITALS — BP 110/76 | HR 114 | Wt 176.0 lb

## 2015-10-25 DIAGNOSIS — N898 Other specified noninflammatory disorders of vagina: Secondary | ICD-10-CM | POA: Diagnosis not present

## 2015-10-25 DIAGNOSIS — N939 Abnormal uterine and vaginal bleeding, unspecified: Secondary | ICD-10-CM

## 2015-10-25 DIAGNOSIS — R102 Pelvic and perineal pain: Secondary | ICD-10-CM

## 2015-10-25 LAB — POCT URINALYSIS DIPSTICK
Bilirubin, UA: NEGATIVE
Blood, UA: NEGATIVE
Glucose, UA: NEGATIVE
Ketones, UA: NEGATIVE
LEUKOCYTES UA: NEGATIVE
PROTEIN UA: NEGATIVE
Spec Grav, UA: 1.01
UROBILINOGEN UA: 1
pH, UA: 8

## 2015-10-25 MED ORDER — OXYCODONE-ACETAMINOPHEN 10-325 MG PO TABS: 1.0000 | ORAL_TABLET | ORAL | Status: DC | PRN

## 2015-10-25 MED ORDER — MEDROXYPROGESTERONE ACETATE 10 MG PO TABS: 10.0000 mg | ORAL_TABLET | Freq: Every day | ORAL | Status: DC

## 2015-10-25 NOTE — Progress Notes (Signed)
Patient ID: [name], female   DOB: [birth year], 40 y.o.   MRN: [id number]  Chief Complaint  Patient presents with  . Menstrual Problem    cycles 2x month, painful cramps    HPI Christina Valenzuela is a 40 y.o. female.  Painful, irregular periods.  Also painful intermenstrual bleeding, and lower pelvic pain after periods.Marland Kitchen  HPI  Past Medical History  Diagnosis Date  . Depression     Past Surgical History  Procedure Laterality Date  . Tubal ligation    . Hernia repair    . Cesarean section    . Cesarean section      Family History  Problem Relation Age of Onset  . Diabetes Maternal Grandfather     Social History Social History  Substance Use Topics  . Smoking status: Current Every Day Smoker -- 0.25 packs/day    Types: Cigarettes  . Smokeless tobacco: Never Used  . Alcohol Use: 0.0 oz/week    0 Standard drinks or equivalent per week     Comment: social    Allergies  Allergen Reactions  . Latex Swelling    Vaginal swelling    Current Outpatient Prescriptions  Medication Sig Dispense Refill  . FLUoxetine (PROZAC) 20 MG capsule Take 20 mg by mouth.    Marland Kitchen HYDROcodone-acetaminophen (NORCO/VICODIN) 5-325 MG tablet Take 2 tablets by mouth every 4 (four) hours as needed. 10 tablet 0  . ibuprofen (ADVIL,MOTRIN) 800 MG tablet TAKE 1 TABLET BY MOUTH EVERY 8 HOURS AS NEEDED FOR PAIN 30 tablet 0  . medroxyPROGESTERone (PROVERA) 10 MG tablet Take 1 tablet (10 mg total) by mouth daily. 30 tablet 0  . oxyCODONE-acetaminophen (PERCOCET) 10-325 MG tablet Take 1 tablet by mouth every 4 (four) hours as needed for pain. 40 tablet 0   No current facility-administered medications for this visit.    Review of Systems Review of Systems Constitutional: negative for fatigue and weight loss Respiratory: negative for cough and wheezing Cardiovascular: negative for chest pain, fatigue and palpitations Gastrointestinal: negative for abdominal pain and change in bowel  habits Genitourinary:positive for painful, irregular periods.  Also pelvic pain Integument/breast: negative for nipple discharge Musculoskeletal:negative for myalgias Neurological: negative for gait problems and tremors Behavioral/Psych: negative for abusive relationship, depression Endocrine: negative for temperature intolerance     Blood pressure 110/76, pulse 114, weight 176 lb (79.833 kg), last menstrual period 10/06/2015.  Physical Exam Physical Exam            General:  Alert and no distress Abdomen:  normal findings: no organomegaly, soft, non-tender and no hernia  Pelvis:  External genitalia: normal general appearance Urinary system: urethral meatus normal and bladder without fullness, nontender Vaginal: normal without tenderness, induration or masses Cervix: normal appearance Adnexa: normal bimanual exam Uterus: anteverted and non-tender, normal size     Data Reviewed Labs Ultrasound  Assessment     AUB - probably secondary to fibroids Dysmenorrhea H/O symptomatic uterine fibroids     Plan    Considering Endometrial Ablation, and possible Hysterectomy if Ablation fails. Provera 10 mg po daily for 30 days for endometrial preparation for Ablation  F/U in 4 weeks.  No orders of the defined types were placed in this encounter.   Meds ordered this encounter  Medications  . DISCONTD: oxyCODONE-acetaminophen (PERCOCET) 10-325 MG tablet    Sig: Take 1 tablet by mouth every 4 (four) hours as needed for pain.    Dispense:  30 tablet    Refill:  0  .  oxyCODONE-acetaminophen (PERCOCET) 10-325 MG tablet    Sig: Take 1 tablet by mouth every 4 (four) hours as needed for pain.    Dispense:  40 tablet    Refill:  0  . medroxyPROGESTERone (PROVERA) 10 MG tablet    Sig: Take 1 tablet (10 mg total) by mouth daily.    Dispense:  30 tablet    Refill:  0

## 2015-10-26 NOTE — Addendum Note (Signed)
Addended by: Eliezer Lofts on: 10/26/2015 08:53 AM   Modules accepted: Orders

## 2015-10-26 NOTE — Addendum Note (Signed)
Addended by: Eliezer Lofts on: 10/26/2015 08:54 AM   Modules accepted: Orders

## 2015-10-26 NOTE — Addendum Note (Signed)
Addended by: Clarnce Flock E on: 10/26/2015 10:52 AM   Modules accepted: Orders

## 2015-10-26 NOTE — Addendum Note (Signed)
Addended by: Eliezer Lofts on: 10/26/2015 08:57 AM   Modules accepted: Orders

## 2015-10-26 NOTE — Addendum Note (Signed)
Addended by: Eliezer Lofts on: 10/26/2015 08:52 AM   Modules accepted: Orders

## 2015-10-27 LAB — URINE CULTURE: ORGANISM ID, BACTERIA: NO GROWTH

## 2015-10-29 ENCOUNTER — Other Ambulatory Visit: Payer: Self-pay | Admitting: Obstetrics

## 2015-10-31 ENCOUNTER — Telehealth (HOSPITAL_COMMUNITY): Payer: Self-pay | Admitting: *Deleted

## 2015-10-31 ENCOUNTER — Other Ambulatory Visit: Payer: Self-pay | Admitting: Obstetrics

## 2015-10-31 DIAGNOSIS — B9689 Other specified bacterial agents as the cause of diseases classified elsewhere: Secondary | ICD-10-CM

## 2015-10-31 DIAGNOSIS — N760 Acute vaginitis: Principal | ICD-10-CM

## 2015-10-31 LAB — NUSWAB VG+, CANDIDA 6SP
ATOPOBIUM VAGINAE: HIGH {score} — AB
CANDIDA ALBICANS, NAA: POSITIVE — AB
CANDIDA KRUSEI, NAA: NEGATIVE
CANDIDA LUSITANIAE, NAA: NEGATIVE
CANDIDA PARAPSILOSIS, NAA: NEGATIVE
Candida glabrata, NAA: NEGATIVE
Candida tropicalis, NAA: NEGATIVE
Chlamydia trachomatis, NAA: NEGATIVE
Neisseria gonorrhoeae, NAA: NEGATIVE
Trich vag by NAA: NEGATIVE

## 2015-10-31 MED ORDER — METRONIDAZOLE 500 MG PO TABS: 500.0000 mg | ORAL_TABLET | Freq: Two times a day (BID) | ORAL | Status: DC

## 2015-11-15 NOTE — Telephone Encounter (Signed)
See telephone note for this encounter.

## 2015-11-22 ENCOUNTER — Ambulatory Visit (INDEPENDENT_AMBULATORY_CARE_PROVIDER_SITE_OTHER): Payer: BLUE CROSS/BLUE SHIELD | Admitting: Obstetrics

## 2015-11-22 ENCOUNTER — Encounter: Payer: Self-pay | Admitting: Obstetrics

## 2015-11-22 VITALS — BP 112/80 | HR 101 | Temp 98.7°F | Wt 175.9 lb

## 2015-11-22 DIAGNOSIS — D251 Intramural leiomyoma of uterus: Secondary | ICD-10-CM | POA: Diagnosis not present

## 2015-11-22 DIAGNOSIS — N946 Dysmenorrhea, unspecified: Secondary | ICD-10-CM | POA: Diagnosis not present

## 2015-11-22 DIAGNOSIS — N939 Abnormal uterine and vaginal bleeding, unspecified: Secondary | ICD-10-CM | POA: Diagnosis not present

## 2015-11-22 DIAGNOSIS — D509 Iron deficiency anemia, unspecified: Secondary | ICD-10-CM

## 2015-11-22 MED ORDER — OXYCODONE-ACETAMINOPHEN 10-325 MG PO TABS: 1.0000 | ORAL_TABLET | ORAL | Status: DC | PRN

## 2015-11-22 NOTE — Progress Notes (Signed)
Patient ID: Christina Valenzuela, female   DOB: March 09, 1976, 40 y.o.   MRN: [id number]  Chief Complaint  Patient presents with  . Follow-up    follow up on bleeding, ?ablation prep    HPI Christina Valenzuela is a 40 y.o. female.  C/O heavy and painful periods.  Would like to discuss treatment options.  HPI  Past Medical History  Diagnosis Date  . Depression     Past Surgical History  Procedure Laterality Date  . Tubal ligation    . Hernia repair    . Cesarean section    . Cesarean section      Family History  Problem Relation Age of Onset  . Diabetes Maternal Grandfather     Social History Social History  Substance Use Topics  . Smoking status: Current Every Day Smoker -- 0.25 packs/day    Types: Cigarettes  . Smokeless tobacco: Never Used  . Alcohol Use: 0.0 oz/week    0 Standard drinks or equivalent per week     Comment: social    Allergies  Allergen Reactions  . Latex Swelling    Vaginal swelling    Current Outpatient Prescriptions  Medication Sig Dispense Refill  . fluconazole (DIFLUCAN) 150 MG tablet TAKE 1 TABLET BY MOUTH ONCE 1 tablet 0  . FLUoxetine (PROZAC) 20 MG capsule Take 20 mg by mouth.    Marland Kitchen HYDROcodone-acetaminophen (NORCO/VICODIN) 5-325 MG tablet Take 2 tablets by mouth every 4 (four) hours as needed. 10 tablet 0  . ibuprofen (ADVIL,MOTRIN) 800 MG tablet TAKE 1 TABLET BY MOUTH EVERY 8 HOURS AS NEEDED FOR PAIN 30 tablet 0  . medroxyPROGESTERone (PROVERA) 10 MG tablet Take 1 tablet (10 mg total) by mouth daily. 30 tablet 0  . metroNIDAZOLE (FLAGYL) 500 MG tablet Take 1 tablet (500 mg total) by mouth 2 (two) times daily. 14 tablet 2  . oxyCODONE-acetaminophen (PERCOCET) 10-325 MG tablet Take 1 tablet by mouth every 4 (four) hours as needed for pain. 40 tablet 0   No current facility-administered medications for this visit.    Review of Systems Review of Systems Constitutional: negative for fatigue and weight loss Respiratory: negative for cough and  wheezing Cardiovascular: negative for chest pain, fatigue and palpitations Gastrointestinal: negative for abdominal pain and change in bowel habits Genitourinary:negative Integument/breast: negative for nipple discharge Musculoskeletal:negative for myalgias Neurological: negative for gait problems and tremors Behavioral/Psych: negative for abusive relationship, depression Endocrine: negative for temperature intolerance     Blood pressure 112/80, pulse 101, temperature 98.7 F (37.1 C), weight 175 lb 14.4 oz (79.788 kg).  Physical Exam Physical Exam:  Deferred  100% of 10 min visit spent on counseling and coordination of care.   Data Reviewed Ultrasound Labs  Assessment     AUB Uterine Fibroids  Anemia     Plan    Patient desires definitive surgical management.  Options discussed.  Robotic - Assisted TLH requested.  No orders of the defined types were placed in this encounter.   Meds ordered this encounter  Medications  . oxyCODONE-acetaminophen (PERCOCET) 10-325 MG tablet    Sig: Take 1 tablet by mouth every 4 (four) hours as needed for pain.    Dispense:  40 tablet    Refill:  0

## 2015-11-23 NOTE — Addendum Note (Signed)
Addended by: Baltazar Najjar A on: 11/23/2015 12:43 PM   Modules accepted: Orders

## 2015-12-12 ENCOUNTER — Encounter (HOSPITAL_COMMUNITY): Payer: Self-pay | Admitting: Emergency Medicine

## 2015-12-12 ENCOUNTER — Emergency Department (HOSPITAL_COMMUNITY)
Admission: EM | Admit: 2015-12-12 | Discharge: 2015-12-12 | Disposition: A | Discharge status: Home / Self Care | Payer: BLUE CROSS/BLUE SHIELD | Attending: Emergency Medicine | Admitting: Emergency Medicine

## 2015-12-12 DIAGNOSIS — Z91040 Latex allergy status: Secondary | ICD-10-CM | POA: Insufficient documentation

## 2015-12-12 DIAGNOSIS — Y999 Unspecified external cause status: Secondary | ICD-10-CM | POA: Diagnosis not present

## 2015-12-12 DIAGNOSIS — F17210 Nicotine dependence, cigarettes, uncomplicated: Secondary | ICD-10-CM | POA: Diagnosis not present

## 2015-12-12 DIAGNOSIS — Y929 Unspecified place or not applicable: Secondary | ICD-10-CM | POA: Diagnosis not present

## 2015-12-12 DIAGNOSIS — Y939 Activity, unspecified: Secondary | ICD-10-CM | POA: Diagnosis not present

## 2015-12-12 DIAGNOSIS — T63464A Toxic effect of venom of wasps, undetermined, initial encounter: Secondary | ICD-10-CM

## 2015-12-12 NOTE — ED Triage Notes (Signed)
Pt st's she was stung by a wasp on right upper arm yesterday.  Pt c/o pain and swelling to left arm.

## 2015-12-12 NOTE — Discharge Instructions (Signed)
Continue applying home hydrocortisone cream to affected area. Take benadryl for itch. Take ibuprofen as needed for pain. Follow up with your primary care provider as needed. Return to the ED if you experience severe worsening of your symptoms, lip or tongue swelling, fevers or chills.

## 2015-12-12 NOTE — ED Provider Notes (Signed)
[facility] DEPT Provider Note   CSN: [id number] Arrival date & time: 12/12/15  [id number]  First Provider Contact:  None       History   Chief Complaint Chief Complaint  Patient presents with  . Insect Bite    HPI Christina Valenzuela is a 40 y.o. female with no significant past medical history who presents to the ED today complaining of a wasp sting to her right upper extremity. Patient states that she was stung by a wasp yesterday. She took 1 dose of Benadryl and has been applying hydrocortisone cream without relief of her symptoms. Patient states she was awoken this morning from sleep due to pain in her arm and she wanted to have it evaluated. Patient states that she was also stung by another wasp 2 weeks ago in the knee which resolved much sooner than this one has so she is concerned that there may be something wrong. She denies any fevers, chills and a lip or tongue swelling, difficulty breathing.  HPI  Past Medical History:  Diagnosis Date  . Depression     Patient Active Problem List   Diagnosis Date Noted  . BV (bacterial vaginosis) 02/16/2014  . Abnormal uterine bleeding (AUB) 02/16/2014  . UTI symptoms 02/16/2014  . Leiomyoma of uterus, unspecified 08/25/2013  . Dysmenorrhea 08/25/2013  . Vaginitis and vulvovaginitis, unspecified 08/25/2013  . 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    Past Surgical History:  Procedure Laterality Date  . CESAREAN SECTION    . CESAREAN SECTION    . HERNIA REPAIR    . TUBAL LIGATION      OB History    Gravida Para Term Preterm AB Living   8 4 3 1 4 5    SAB TAB Ectopic Multiple Live Births   2 2 0 1 5       Home Medications    Prior to Admission medications   Medication Sig Start Date End Date Taking? Authorizing Provider  fluconazole (DIFLUCAN) 150 MG tablet TAKE 1 TABLET BY MOUTH ONCE 10/29/15   Shelly Bombard, MD  FLUoxetine (PROZAC) 20 MG capsule Take 20 mg by mouth. 08/22/15 08/21/16  Historical Provider, MD    HYDROcodone-acetaminophen (NORCO/VICODIN) 5-325 MG tablet Take 2 tablets by mouth every 4 (four) hours as needed. 10/15/15   Glendell Docker, NP  ibuprofen (ADVIL,MOTRIN) 800 MG tablet TAKE 1 TABLET BY MOUTH EVERY 8 HOURS AS NEEDED FOR PAIN 08/15/15   Shelly Bombard, MD  medroxyPROGESTERone (PROVERA) 10 MG tablet Take 1 tablet (10 mg total) by mouth daily. 10/25/15   Shelly Bombard, MD  metroNIDAZOLE (FLAGYL) 500 MG tablet Take 1 tablet (500 mg total) by mouth 2 (two) times daily. 10/31/15   Shelly Bombard, MD  oxyCODONE-acetaminophen (PERCOCET) 10-325 MG tablet Take 1 tablet by mouth every 4 (four) hours as needed for pain. 11/22/15   Shelly Bombard, MD    Family History Family History  Problem Relation Age of Onset  . Diabetes Maternal Grandfather     Social History Social History  Substance Use Topics  . Smoking status: Current Every Day Smoker    Packs/day: 0.25    Types: Cigarettes  . Smokeless tobacco: Never Used  . Alcohol use 0.0 oz/week     Comment: social     Allergies   Latex   Review of Systems Review of Systems  All other systems reviewed and are negative.    Physical Exam Updated Vital Signs BP 107/76 (BP Location: Left Arm)  Pulse 96   Temp 97.9 F (36.6 C) (Oral)   Resp 18   Ht 5\' 2"  (1.575 m)   Wt 79.4 kg   LMP 11/28/2015   SpO2 100%   BMI 32.01 kg/m   Physical Exam  Constitutional: She is oriented to person, place, and time. She appears well-developed and well-nourished. No distress.  HENT:  Head: Normocephalic and atraumatic.  Eyes: Conjunctivae are normal. Right eye exhibits no discharge. Left eye exhibits no discharge. No scleral icterus.  Cardiovascular: Normal rate.   Pulmonary/Chest: Effort normal.  Neurological: She is alert and oriented to person, place, and time. Coordination normal.  Skin: Skin is warm and dry. No rash noted. She is not diaphoretic. No erythema. No pallor.  Single insect bite noted to RUE. Mild erythema  noted. No swelling, induration or fluctuance.  Psychiatric: She has a normal mood and affect. Her behavior is normal.  Nursing note and vitals reviewed.    ED Treatments / Results  Labs (all labs ordered are listed, but only abnormal results are displayed) Labs Reviewed - No data to display  EKG  EKG Interpretation None       Radiology No results found.  Procedures Procedures (including critical care time)  Medications Ordered in ED Medications - No data to display   Initial Impression / Assessment and Plan / ED Course  I have reviewed the triage vital signs and the nursing notes.  Pertinent labs & imaging results that were available during my care of the patient were reviewed by me and considered in my medical decision making (see chart for details).  Clinical Course    Patient presents to the ED with uncomplicated insect sting to right upper extremity. No sign of infection, severe allergic reaction. Recommend continued hydrocortisone cream, Benadryl. Patient may take ibuprofen as needed for pain. Strict return precautions given and discussed. Patient expresses understanding and is agreeable to treatment plan.  Final Clinical Impressions(s) / ED Diagnoses   Final diagnoses:  Wasp sting, undetermined intent, initial encounter    New Prescriptions Discharge Medication List as of 12/12/2015  5:18 AM       Carlos Levering, PA-C 12/12/15 Brandon Liu, MD 12/12/15 2036

## 2016-03-26 ENCOUNTER — Telehealth: Payer: Self-pay | Admitting: *Deleted

## 2016-03-26 MED ORDER — FLUCONAZOLE 150 MG PO TABS: 150.0000 mg | ORAL_TABLET | Freq: Once | ORAL | 1 refills | Status: AC

## 2016-03-26 NOTE — Telephone Encounter (Signed)
Patient called stating she has a yeast infection and she would like treatment called to her pharmacy. Rx sent per protocol. Patient transferred to front to schedule appointment for follow up.

## 2016-04-10 ENCOUNTER — Ambulatory Visit: Payer: BLUE CROSS/BLUE SHIELD | Admitting: Obstetrics

## 2016-06-16 ENCOUNTER — Telehealth: Payer: Self-pay

## 2016-06-16 ENCOUNTER — Other Ambulatory Visit: Payer: Self-pay

## 2016-06-16 DIAGNOSIS — B373 Candidiasis of vulva and vagina: Secondary | ICD-10-CM

## 2016-06-16 DIAGNOSIS — B3731 Acute candidiasis of vulva and vagina: Secondary | ICD-10-CM

## 2016-06-16 DIAGNOSIS — N946 Dysmenorrhea, unspecified: Secondary | ICD-10-CM

## 2016-06-16 MED ORDER — IBUPROFEN 800 MG PO TABS: 800.0000 mg | ORAL_TABLET | Freq: Three times a day (TID) | ORAL | 5 refills | Status: DC | PRN

## 2016-06-16 MED ORDER — FLUCONAZOLE 150 MG PO TABS: 150.0000 mg | ORAL_TABLET | Freq: Once | ORAL | 0 refills | Status: AC

## 2016-06-16 NOTE — Telephone Encounter (Signed)
ERROR - dup encounter

## 2016-06-16 NOTE — Telephone Encounter (Signed)
Pt c/o possible yeast infection with cottage cheese discharge and itching. She requests Diflucan with rf's. Diflucan no rf's approved. Pt also c/o dysmenorrhea and is requesting Percocet rf. Informed pt we can not approve Percocet at this time. Pt voiced understanding. She requested IB 800 mg instead. After consulting with provider, IB approved with 5 rf's. Pt agrees and has no further questions.

## 2016-08-22 ENCOUNTER — Other Ambulatory Visit: Payer: Self-pay | Admitting: Family

## 2016-08-22 DIAGNOSIS — R921 Mammographic calcification found on diagnostic imaging of breast: Secondary | ICD-10-CM

## 2016-09-04 ENCOUNTER — Encounter: Payer: Self-pay | Admitting: Obstetrics

## 2016-09-04 ENCOUNTER — Ambulatory Visit
Admission: RE | Admit: 2016-09-04 | Discharge: 2016-09-04 | Disposition: A | Discharge status: Home / Self Care | Payer: BLUE CROSS/BLUE SHIELD | Source: Ambulatory Visit | Attending: Family | Admitting: Family

## 2016-09-04 ENCOUNTER — Inpatient Hospital Stay (HOSPITAL_COMMUNITY)
Admission: AD | Admit: 2016-09-04 | Discharge: 2016-09-04 | Disposition: A | Discharge status: Home / Self Care | Payer: BLUE CROSS/BLUE SHIELD | Source: Ambulatory Visit | Attending: Obstetrics and Gynecology | Admitting: Obstetrics and Gynecology

## 2016-09-04 ENCOUNTER — Ambulatory Visit (INDEPENDENT_AMBULATORY_CARE_PROVIDER_SITE_OTHER): Payer: BLUE CROSS/BLUE SHIELD | Admitting: Obstetrics

## 2016-09-04 VITALS — BP 103/73 | HR 113 | Ht 62.0 in | Wt 174.7 lb

## 2016-09-04 DIAGNOSIS — Z308 Encounter for other contraceptive management: Secondary | ICD-10-CM | POA: Diagnosis present

## 2016-09-04 DIAGNOSIS — N939 Abnormal uterine and vaginal bleeding, unspecified: Secondary | ICD-10-CM

## 2016-09-04 DIAGNOSIS — Z113 Encounter for screening for infections with a predominantly sexual mode of transmission: Secondary | ICD-10-CM | POA: Diagnosis not present

## 2016-09-04 DIAGNOSIS — B3731 Acute candidiasis of vulva and vagina: Secondary | ICD-10-CM

## 2016-09-04 DIAGNOSIS — Z01419 Encounter for gynecological examination (general) (routine) without abnormal findings: Secondary | ICD-10-CM

## 2016-09-04 DIAGNOSIS — Z0000 Encounter for general adult medical examination without abnormal findings: Secondary | ICD-10-CM

## 2016-09-04 DIAGNOSIS — B373 Candidiasis of vulva and vagina: Secondary | ICD-10-CM

## 2016-09-04 DIAGNOSIS — N393 Stress incontinence (female) (male): Secondary | ICD-10-CM

## 2016-09-04 DIAGNOSIS — D251 Intramural leiomyoma of uterus: Secondary | ICD-10-CM

## 2016-09-04 DIAGNOSIS — N946 Dysmenorrhea, unspecified: Secondary | ICD-10-CM

## 2016-09-04 DIAGNOSIS — R921 Mammographic calcification found on diagnostic imaging of breast: Secondary | ICD-10-CM

## 2016-09-04 DIAGNOSIS — E611 Iron deficiency: Secondary | ICD-10-CM

## 2016-09-04 DIAGNOSIS — A6004 Herpesviral vulvovaginitis: Secondary | ICD-10-CM

## 2016-09-04 MED ORDER — VALACYCLOVIR HCL 500 MG PO TABS: 500.0000 mg | ORAL_TABLET | Freq: Two times a day (BID) | ORAL | 99 refills | Status: DC

## 2016-09-04 MED ORDER — MEDROXYPROGESTERONE ACETATE 150 MG/ML IM SUSP: 400.0000 mg | Freq: Once | INTRAMUSCULAR | Status: DC

## 2016-09-04 MED ORDER — FLUCONAZOLE 150 MG PO TABS: 150.0000 mg | ORAL_TABLET | Freq: Once | ORAL | 0 refills | Status: DC

## 2016-09-04 MED ORDER — MEDROXYPROGESTERONE ACETATE 400 MG/ML IM SUSP
400.0000 mg | Freq: Once | INTRAMUSCULAR | Status: AC
  Administered 2016-09-04: 400 mg via INTRAMUSCULAR
  Filled 2016-09-04: qty 1

## 2016-09-04 MED ORDER — IBUPROFEN 800 MG PO TABS: 800.0000 mg | ORAL_TABLET | Freq: Three times a day (TID) | ORAL | 5 refills | Status: DC | PRN

## 2016-09-04 NOTE — Progress Notes (Signed)
Subjective:        Christina Valenzuela is a 41 y.o. female here for a routine exam.  Current complaints: Continues to have heavy periods.  Severe anemia.  Patient had desired definitive surgical treatment on last visit but she is too anemic currently for surgery.  Also complains of leaking urine with cough, sneeze, etc.  Personal health questionnaire:  Is patient Ashkenazi Jewish, have a family history of breast and/or ovarian cancer: no Is there a family history of uterine cancer diagnosed at age < 42, gastrointestinal cancer, urinary tract cancer, family member who is a Field seismologist syndrome-associated carrier: no Is the patient overweight and hypertensive, family history of diabetes, personal history of gestational diabetes, preeclampsia or PCOS: no Is patient over 46, have PCOS,  family history of premature CHD under age 85, diabetes, smoke, have hypertension or peripheral artery disease:  no At any time, has a partner hit, kicked or otherwise hurt or frightened you?: no Over the past 2 weeks, have you felt down, depressed or hopeless?: no Over the past 2 weeks, have you felt little interest or pleasure in doing things?:no   Gynecologic History Patient's last menstrual period was 08/31/2016. Contraception: tubal ligation Last Pap: 2017. Results were: normal Last mammogram: 2017. Results were: normal  Obstetric History OB History  Gravida Para Term Preterm AB Living  8 4 3 1 4 5   SAB TAB Ectopic Multiple Live Births  2 2 0 1 5    # Outcome Date GA Lbr Len/2nd Weight Sex Delivery Anes PTL Lv  8 Term 11/14/06 [redacted]w[redacted]d   F CS-LTranv EPI  LIV  7 Term 02/02/04 [redacted]w[redacted]d  6 lb (2.722 kg) M VBAC EPI  LIV  6 Term 04/27/92 [redacted]w[redacted]d   M Vag-Spont EPI  LIV  5 TAB         DEC  4 TAB         DEC  3 SAB         DEC  2 SAB         DEC  1A Preterm 08/04/96 [redacted]w[redacted]d  1 lb 4 oz (0.567 kg) F CS-LTranv EPI  LIV  1B Preterm    1 lb 5 oz (0.595 kg) F CS-LTranv EPI  LIV      Past Medical History:  Diagnosis Date   . Depression     Past Surgical History:  Procedure Laterality Date  . CESAREAN SECTION    . CESAREAN SECTION    . HERNIA REPAIR    . TUBAL LIGATION      No current outpatient prescriptions on file. Allergies  Allergen Reactions  . Latex Swelling    Vaginal swelling    Social History  Substance Use Topics  . Smoking status: Current Every Day Smoker    Packs/day: 0.25    Types: Cigarettes  . Smokeless tobacco: Never Used  . Alcohol use No    Family History  Problem Relation Age of Onset  . Diabetes Maternal Grandfather       Review of Systems  Constitutional: negative for fatigue and weight loss Respiratory: negative for cough and wheezing Cardiovascular: negative for chest pain, fatigue and palpitations Gastrointestinal: negative for abdominal pain and change in bowel habits Musculoskeletal:negative for myalgias Neurological: negative for gait problems and tremors Behavioral/Psych: negative for abusive relationship, depression Endocrine: negative for temperature intolerance    Genitourinary:negative for abnormal menstrual periods, genital lesions, hot flashes, sexual problems and vaginal discharge Integument/breast: negative for breast lump, breast tenderness, nipple  discharge and skin lesion(s)    Objective:       BP 103/73   Pulse (!) 113   Ht 5\' 2"  (1.575 m)   Wt 174 lb 11.2 oz (79.2 kg)   LMP 08/31/2016   BMI 31.95 kg/m  General:   alert  Skin:   no rash or abnormalities  Lungs:   clear to auscultation bilaterally  Heart:   regular rate and rhythm, S1, S2 normal, no murmur, click, rub or gallop  Breasts:   normal without suspicious masses, skin or nipple changes or axillary nodes  Abdomen:  normal findings: no organomegaly, soft, non-tender and no hernia  Pelvis:  External genitalia: normal general appearance Urinary system: urethral meatus normal and bladder without fullness, nontender Vaginal: normal without tenderness, induration or masses Cervix:  normal appearance Adnexa: normal bimanual exam Uterus: anteverted and non-tender, normal size   Lab Review Urine pregnancy test Labs reviewed yes Radiologic studies reviewed yes  50% of 20 min visit spent on counseling and coordination of care.    Assessment:    Healthy female exam.    Symptomatic Uterine Fibroids  AUB - Leiomyoma   Severe Anemia  SUI   Plan:   Sent to [facility] for High Dose Depo Provera Injection.  Repeat in 2 weeks. Iron ( Fusion Plus )  Dispensed Referred to Urogynecology for evaluation of SUI and Fibroids  Education reviewed: calcium supplements, depression evaluation, low fat, low cholesterol diet, safe sex/STD prevention, self breast exams and weight bearing exercise. Follow up in: 2 months. CBC  Meds ordered this encounter  Medications  . valACYclovir (VALTREX) 500 MG tablet    Sig: Take 1 tablet (500 mg total) by mouth 2 (two) times daily.    Dispense:  30 tablet    Refill:  prn  . ibuprofen (ADVIL,MOTRIN) 800 MG tablet    Sig: Take 1 tablet (800 mg total) by mouth every 8 (eight) hours as needed for cramping. Take 1 tab po every 8 hrs as needed for pain    Dispense:  30 tablet    Refill:  5  . fluconazole (DIFLUCAN) 150 MG tablet    Sig: Take 1 tablet (150 mg total) by mouth once.    Dispense:  1 tablet    Refill:  0   Orders Placed This Encounter  Procedures  . Ambulatory referral to Urogynecology    Referral Priority:   Routine    Referral Type:   Consultation    Referral Reason:   Specialty Services Required    Requested Specialty:   Urology    Number of Visits Requested:   1     Patient ID: JACKOLYN GERON, female   DOB: 01-01-1976, 41 y.o.   MRN: [id number]

## 2016-09-04 NOTE — MAU Note (Signed)
Pt sent from office for Depo.

## 2016-09-04 NOTE — Progress Notes (Signed)
Patient is in the office for annual exam, last pap 05-30-15.Patient complains of irregular bleeding and painful cramps, which has caused her to miss work.

## 2016-09-05 LAB — CERVICOVAGINAL ANCILLARY ONLY
Bacterial vaginitis: POSITIVE — AB
CANDIDA VAGINITIS: NEGATIVE
CHLAMYDIA, DNA PROBE: NEGATIVE
Neisseria Gonorrhea: NEGATIVE
Trichomonas: NEGATIVE

## 2016-09-06 ENCOUNTER — Other Ambulatory Visit: Payer: Self-pay | Admitting: Obstetrics

## 2016-09-06 DIAGNOSIS — B9689 Other specified bacterial agents as the cause of diseases classified elsewhere: Secondary | ICD-10-CM

## 2016-09-06 DIAGNOSIS — N760 Acute vaginitis: Principal | ICD-10-CM

## 2016-09-06 MED ORDER — METRONIDAZOLE 500 MG PO TABS: 500.0000 mg | ORAL_TABLET | Freq: Two times a day (BID) | ORAL | 2 refills | Status: DC

## 2016-09-08 ENCOUNTER — Other Ambulatory Visit: Payer: Self-pay | Admitting: *Deleted

## 2016-09-08 DIAGNOSIS — B9689 Other specified bacterial agents as the cause of diseases classified elsewhere: Secondary | ICD-10-CM

## 2016-09-08 DIAGNOSIS — N760 Acute vaginitis: Principal | ICD-10-CM

## 2016-09-08 LAB — CYTOLOGY - PAP
DIAGNOSIS: NEGATIVE
HPV: NOT DETECTED

## 2016-09-08 MED ORDER — METRONIDAZOLE 0.75 % VA GEL: 1.0000 | Freq: Every day | VAGINAL | 0 refills | Status: DC

## 2016-09-08 NOTE — Progress Notes (Signed)
Attempt to contact pt. LM on VM to call office.

## 2016-09-08 NOTE — Progress Notes (Signed)
Pt aware.

## 2016-09-08 NOTE — Progress Notes (Signed)
Pt made aware of +BV results and Rx sent by provider. Pt request Metrogel instead of Flagyl.  Pt states she cannot tolerate oral Flagyl. Metrogel sent to pharmacy.

## 2016-09-11 ENCOUNTER — Emergency Department (HOSPITAL_BASED_OUTPATIENT_CLINIC_OR_DEPARTMENT_OTHER)
Admission: EM | Admit: 2016-09-11 | Discharge: 2016-09-12 | Disposition: A | Discharge status: Home / Self Care | Payer: BLUE CROSS/BLUE SHIELD | Attending: Emergency Medicine | Admitting: Emergency Medicine

## 2016-09-11 ENCOUNTER — Encounter (HOSPITAL_BASED_OUTPATIENT_CLINIC_OR_DEPARTMENT_OTHER): Payer: Self-pay | Admitting: Emergency Medicine

## 2016-09-11 DIAGNOSIS — D649 Anemia, unspecified: Secondary | ICD-10-CM

## 2016-09-11 DIAGNOSIS — R1110 Vomiting, unspecified: Secondary | ICD-10-CM

## 2016-09-11 DIAGNOSIS — Z79899 Other long term (current) drug therapy: Secondary | ICD-10-CM | POA: Diagnosis not present

## 2016-09-11 DIAGNOSIS — K2900 Acute gastritis without bleeding: Secondary | ICD-10-CM

## 2016-09-11 DIAGNOSIS — R1013 Epigastric pain: Secondary | ICD-10-CM | POA: Diagnosis present

## 2016-09-11 DIAGNOSIS — F17210 Nicotine dependence, cigarettes, uncomplicated: Secondary | ICD-10-CM | POA: Insufficient documentation

## 2016-09-11 DIAGNOSIS — K5900 Constipation, unspecified: Secondary | ICD-10-CM | POA: Diagnosis not present

## 2016-09-11 NOTE — ED Triage Notes (Signed)
Patient states that she had a sudden onset of epigastric pain tonight about 2 - 3 hours after she ate chicken with hot sauce. The patient reports an episode like this about a month ago as well.

## 2016-09-11 NOTE — ED Provider Notes (Addendum)
[facility] Provider Note  CSN: [id number] Arrival date & time: 09/11/16  2336 By signing my name below, I, Dyke Brackett, attest that this documentation has been prepared under the direction and in the presence of Owsley, Rielynn Trulson, MD . Electronically Signed: Dyke Brackett, Scribe. 09/12/2016. 12:08 AM.   History   Chief Complaint Chief Complaint  Patient presents with  . Abdominal Pain    epigastric    HPI Christina Valenzuela is a 41 y.o. female with a history of who presents to the Emergency Department complaining of sudden onset epigastric pain onset 2-3 hours ago. Pt states she woke up tonight with epigastric pain and associated vomiting. Per pt, she vomited 5 times this evening PTA. Pt reports that she had eaten chicken with hot sauce about 3 hours before onset. No alleviating or modifying factors noted.  No OTC treatments tried for these symptoms PTA.  Of note, pt has been seen from the same seven times in the past month, and her doctor had previously diagnosed her with chronic anemia secondary to heavy periods for which she is on iron therapy and she has also been diagnosed with chronic abdominal pain. Pt reports that she has had one episode of similar pain ~1 month ago. Pt reports a hx of anemia and states she is currently taking iron 3x per day. She expresses some constipation; last BM yesterday. No abdominal SHx. Pt denies any nausea or diarrhea nad has no other complaints at this time.  The history is provided by the patient. No language interpreter was used.  Abdominal Pain   This is a recurrent problem. The current episode started more than 1 week ago (2-3 hours ago). The problem occurs constantly. The problem has not changed since onset.The pain is associated with an unknown factor. The pain is located in the epigastric region. The quality of the pain is aching. The pain is at a severity of 10/10. Associated symptoms include vomiting and constipation. Pertinent negatives  include fever, diarrhea and nausea. Nothing aggravates the symptoms. Nothing relieves the symptoms. Past workup does not include GI consult. Her past medical history does not include irritable bowel syndrome.   Past Medical History:  Diagnosis Date  . Depression    Patient Active Problem List   Diagnosis Date Noted  . BV (bacterial vaginosis) 02/16/2014  . Abnormal uterine bleeding (AUB) 02/16/2014  . UTI symptoms 02/16/2014  . Leiomyoma of uterus, unspecified 08/25/2013  . Dysmenorrhea 08/25/2013  . Vaginitis and vulvovaginitis, unspecified 08/25/2013  . 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   Past Surgical History:  Procedure Laterality Date  . CESAREAN SECTION    . CESAREAN SECTION    . HERNIA REPAIR    . TUBAL LIGATION      OB History    Gravida Para Term Preterm AB Living   8 4 3 1 4 5    SAB TAB Ectopic Multiple Live Births   2 2 0 1 5     Home Medications    Prior to Admission medications   Medication Sig Start Date End Date Taking? Authorizing Provider  FLUoxetine (PROZAC) 20 MG capsule Take 20 mg by mouth. 08/22/15 08/21/16  [provider]  HYDROcodone-acetaminophen (NORCO/VICODIN) 5-325 MG tablet Take 2 tablets by mouth every 4 (four) hours as needed. Patient not taking: Reported on 09/04/2016 10/15/15   Glendell Docker, NP  ibuprofen (ADVIL,MOTRIN) 800 MG tablet Take 1 tablet (800 mg total) by mouth every 8 (eight) hours as needed for cramping. Take 1 tab po  every 8 hrs as needed for pain 09/04/16   Shelly Bombard, MD  medroxyPROGESTERone (PROVERA) 10 MG tablet Take 1 tablet (10 mg total) by mouth daily. Patient not taking: Reported on 09/04/2016 10/25/15   Shelly Bombard, MD  metroNIDAZOLE (FLAGYL) 500 MG tablet Take 1 tablet (500 mg total) by mouth 2 (two) times daily. 09/06/16   Shelly Bombard, MD  metroNIDAZOLE (METROGEL VAGINAL) 0.75 % vaginal gel Place 1 Applicatorful vaginally at bedtime. 09/08/16   Shelly Bombard, MD  oxyCODONE-acetaminophen  (PERCOCET) 10-325 MG tablet Take 1 tablet by mouth every 4 (four) hours as needed for pain. Patient not taking: Reported on 09/04/2016 11/22/15   Shelly Bombard, MD  valACYclovir (VALTREX) 500 MG tablet Take 1 tablet (500 mg total) by mouth 2 (two) times daily. 09/04/16   Shelly Bombard, MD   Family History Family History  Problem Relation Age of Onset  . Diabetes Maternal Grandfather    Social History Social History  Substance Use Topics  . Smoking status: Current Every Day Smoker    Packs/day: 0.25    Types: Cigarettes  . Smokeless tobacco: Never Used  . Alcohol use No     Allergies   Latex   Review of Systems Review of Systems  Constitutional: Negative for fever.  Respiratory: Negative for shortness of breath.   Cardiovascular: Negative for chest pain and leg swelling.  Gastrointestinal: Positive for abdominal pain, constipation and vomiting. Negative for diarrhea and nausea.  All other systems reviewed and are negative.    Physical Exam Updated Vital Signs LMP 08/31/2016   Physical Exam  Constitutional: She is oriented to person, place, and time. She appears well-developed and well-nourished. No distress.  HENT:  Head: Normocephalic and atraumatic.  Mouth/Throat: Oropharynx is clear and moist. No oropharyngeal exudate.  Moist mucous membranes   Eyes: Conjunctivae are normal. Pupils are equal, round, and reactive to light.  Neck: Normal range of motion. Neck supple. No JVD present.  Trachea midline No bruit  Cardiovascular: Normal rate, regular rhythm and normal heart sounds.   Pulmonary/Chest: Effort normal and breath sounds normal. No stridor. No respiratory distress. She has no wheezes. She has no rales.  Abdominal: Soft. She exhibits no distension and no mass. Bowel sounds are increased. There is no hepatosplenomegaly. There is no tenderness. There is no rigidity, no rebound, no guarding, no CVA tenderness, no tenderness at McBurney's point and negative  Murphy's sign.  Musculoskeletal: Normal range of motion.  Neurological: She is alert and oriented to person, place, and time. She has normal reflexes. She displays normal reflexes.  Skin: Skin is warm and dry. Capillary refill takes less than 2 seconds.  Psychiatric: She has a normal mood and affect. Her behavior is normal.  Nursing note and vitals reviewed.   ED Treatments / Results   Vitals:   09/11/16 2342 09/12/16 0206  BP: 134/87 128/86  Pulse: (!) 124 89  Resp:  18  Temp: 99.1 F (37.3 C)     DIAGNOSTIC STUDIES: Oxygen Saturation is 100% on RA, normal by my interpretation.    COORDINATION OF CARE: 12:09 AM Discussed treatment plan with pt at bedside and pt agreed to plan.   Labs (all labs ordered are listed, but only abnormal results are displayed) Labs Reviewed - No data to display Results for orders placed or performed during the hospital encounter of 09/11/16  CBC with Differential/Platelet  Result Value Ref Range   WBC 10.4 4.0 - 10.5 K/uL  RBC 4.21 3.87 - 5.11 MIL/uL   Hemoglobin 6.8 (LL) 12.0 - 15.0 g/dL   HCT 24.6 (L) 36.0 - 46.0 %   MCV 58.4 (L) 78.0 - 100.0 fL   MCH 16.2 (L) 26.0 - 34.0 pg   MCHC 27.6 (L) 30.0 - 36.0 g/dL   RDW 20.9 (H) 11.5 - 15.5 %   Platelets 545 (H) 150 - 400 K/uL   Neutrophils Relative % 51 %   Lymphocytes Relative 37 %   Monocytes Relative 8 %   Eosinophils Relative 3 %   Basophils Relative 1 %   Neutro Abs 5.4 1.7 - 7.7 K/uL   Lymphs Abs 3.8 0.7 - 4.0 K/uL   Monocytes Absolute 0.8 0.1 - 1.0 K/uL   Eosinophils Absolute 0.3 0.0 - 0.7 K/uL   Basophils Absolute 0.1 0.0 - 0.1 K/uL   RBC Morphology TARGET CELLS   Comprehensive metabolic panel  Result Value Ref Range   Sodium 138 135 - 145 mmol/L   Potassium 3.6 3.5 - 5.1 mmol/L   Chloride 108 101 - 111 mmol/L   CO2 23 22 - 32 mmol/L   Glucose, Bld 111 (H) 65 - 99 mg/dL   BUN 10 6 - 20 mg/dL   Creatinine, Ser 0.84 0.44 - 1.00 mg/dL   Calcium 9.2 8.9 - 10.3 mg/dL   Total  Protein 7.5 6.5 - 8.1 g/dL   Albumin 3.6 3.5 - 5.0 g/dL   AST 23 15 - 41 U/L   ALT 18 14 - 54 U/L   Alkaline Phosphatase 58 38 - 126 U/L   Total Bilirubin 0.2 (L) 0.3 - 1.2 mg/dL   GFR calc non Af Amer >60 >60 mL/min   GFR calc Af Amer >60 >60 mL/min   Anion gap 7 5 - 15  Lipase, blood  Result Value Ref Range   Lipase 25 11 - 51 U/L  Urinalysis, Routine w reflex microscopic  Result Value Ref Range   Color, Urine YELLOW YELLOW   APPearance CLOUDY (A) CLEAR   Specific Gravity, Urine 1.030 1.005 - 1.030   pH 6.0 5.0 - 8.0   Glucose, UA NEGATIVE NEGATIVE mg/dL   Hgb urine dipstick NEGATIVE NEGATIVE   Bilirubin Urine NEGATIVE NEGATIVE   Ketones, ur NEGATIVE NEGATIVE mg/dL   Protein, ur 30 (A) NEGATIVE mg/dL   Nitrite NEGATIVE NEGATIVE   Leukocytes, UA NEGATIVE NEGATIVE  Pregnancy, urine  Result Value Ref Range   Preg Test, Ur NEGATIVE NEGATIVE  Rapid urine drug screen (hospital performed)  Result Value Ref Range   Opiates NONE DETECTED NONE DETECTED   Cocaine NONE DETECTED NONE DETECTED   Benzodiazepines NONE DETECTED NONE DETECTED   Amphetamines NONE DETECTED NONE DETECTED   Tetrahydrocannabinol NONE DETECTED NONE DETECTED   Barbiturates NONE DETECTED NONE DETECTED  Urinalysis, Microscopic (reflex)  Result Value Ref Range   RBC / HPF NONE SEEN 0 - 5 RBC/hpf   WBC, UA 0-5 0 - 5 WBC/hpf   Bacteria, UA FEW (A) NONE SEEN   Squamous Epithelial / LPF 6-30 (A) NONE SEEN   Mucous PRESENT    Mm Diag Breast Tomo Bilateral  Result Date: 09/04/2016 CLINICAL DATA:  Delayed follow-up right breast calcifications. EXAM: 2D DIGITAL DIAGNOSTIC BILATERAL MAMMOGRAM WITH CAD AND ADJUNCT TOMO COMPARISON:  Previous exam(s). ACR Breast Density Category b: There are scattered areas of fibroglandular density. FINDINGS: The previously noted 2-3 mm group of calcifications in the anterior subareolar right breast are not significantly changed from prior exam. No new  or suspicious abnormality is identified  within either breast. Mammographic images were processed with CAD. IMPRESSION: Probably benign right breast calcifications. RECOMMENDATION: Bilateral diagnostic mammogram in 1 year. I have discussed the findings and recommendations with the patient. Results were also provided in writing at the conclusion of the visit. If applicable, a reminder letter will be sent to the patient regarding the next appointment. BI-RADS CATEGORY  3: Probably benign. Electronically Signed   By: Pamelia Hoit M.D.   On: 09/04/2016 13:30   Ct Renal Stone Study  Result Date: 09/12/2016 CLINICAL DATA:  Vomiting.  Epigastric pain for 3 hours. EXAM: CT ABDOMEN AND PELVIS WITHOUT CONTRAST TECHNIQUE: Multidetector CT imaging of the abdomen and pelvis was performed following the standard protocol without IV contrast. COMPARISON:  None. FINDINGS: Lower chest: The lung bases are clear. No consolidation or pleural fluid. Trace dependent atelectasis. Hepatobiliary: No evidence of focal lesion allowing for lack contrast. Gallbladder physiologically distended, no calcified stone. No biliary dilatation. Pancreas: No ductal dilatation or inflammation. Spleen: Normal in size without focal abnormality. Adrenals/Urinary Tract: Normal adrenal glands. No hydronephrosis or perinephric edema. No urolithiasis. The ureters are decompressed. Urinary bladder is completely decompressed and not evaluated. Stomach/Bowel: Stomach is decompressed. No bowel wall thickening, abnormal distention or inflammation. There is mild fecalization of distal small bowel contents suggesting slow transit. Small to moderate stool burden. No colonic wall thickening. Normal appendix. Vascular/Lymphatic: Abdominal aorta is normal in caliber. There are multiple small retroperitoneal nodes. Small bilateral inguinal nodes. Reproductive: Enlarged uterus with uterine fibroids, a posterior fibroid measures approximately in 5.1 x 6.5 x 5.0 cm. Ovaries are tentatively identified and normal.  Other: No free air, free fluid, or intra-abdominal fluid collection. Laxity of the anterior abdominal wall musculature without frank hernia. Musculoskeletal: There are no acute or suspicious osseous abnormalities. IMPRESSION: 1. Mild fecalization of small bowel contents suggesting slow transit. No evidence of bowel obstruction. 2. Uterine fibroids. Electronically Signed   By: Jeb Levering M.D.   On: 09/12/2016 01:37    Procedures Procedures (including critical care time)  Medications Ordered in ED  Medications  sodium chloride 0.9 % bolus 1,000 mL (0 mLs Intravenous Stopped 09/12/16 0114)  ondansetron (ZOFRAN) injection 4 mg (4 mg Intravenous Given 09/12/16 0032)  ketorolac (TORADOL) 30 MG/ML injection 30 mg (30 mg Intravenous Given 09/12/16 0032)  haloperidol lactate (HALDOL) injection 2 mg (2 mg Intravenous Given 09/12/16 0054)   PO challenged successfully in the ED   Final Clinical Impressions(s) / ED Diagnoses  Chronic anemia: will increase iron to TID as patient has not followed up as directed for iron infusions. Follow up with your doctor to discuss this further. Constipation:  Likely secondary to iron therapy.  Start immediately on miralax therapy daily Gastritis:  Likely secondary to both the spicy chicken and ongoing iron usage.  Will start omeprazole for gastritis.  The patient is nontoxic-appearing on exam and vital signs are within normal limits.  Have also advised a bland diet, no greasy spicy or fried foods.  I do not believ this is biliary colic but have advised close follow up with her PMD for ongoing care and if need be GI follow up to exclude ulcers and H Pylori.  Patient verbalizes understanding and agrees to follow up    I have reviewed the triage vital signs and the nursing notes. Pertinent labs & imaging results that were available during my care of the patient were reviewed by me and considered in my medical decision making (see  chart for details). The patient is  nontoxic-appearing on exam and vital signs are within normal limits. Return for fevers, chest pain with exertion, weakness, numbness, neck pain or stiffness, inability to make or understand speech or any concerns.    After history, exam, and medical workup I feel the patient has been appropriately medically screened and is safe for discharge home. Pertinent diagnoses were discussed with the patient. Patient was given return precautions.    I personally performed the services described in this documentation, which was scribed in my presence. The recorded information has been reviewed and is accurate.       Saara Kijowski, MD 09/12/16 [place], Kindsey Eblin, MD 09/12/16 2010

## 2016-09-12 ENCOUNTER — Encounter (HOSPITAL_BASED_OUTPATIENT_CLINIC_OR_DEPARTMENT_OTHER): Payer: Self-pay | Admitting: Emergency Medicine

## 2016-09-12 ENCOUNTER — Emergency Department (HOSPITAL_BASED_OUTPATIENT_CLINIC_OR_DEPARTMENT_OTHER): Payer: BLUE CROSS/BLUE SHIELD

## 2016-09-12 LAB — URINALYSIS, MICROSCOPIC (REFLEX): RBC / HPF: NONE SEEN RBC/hpf (ref 0–5)

## 2016-09-12 LAB — RAPID URINE DRUG SCREEN, HOSP PERFORMED
AMPHETAMINES: NOT DETECTED
BENZODIAZEPINES: NOT DETECTED
Barbiturates: NOT DETECTED
Cocaine: NOT DETECTED
OPIATES: NOT DETECTED
Tetrahydrocannabinol: NOT DETECTED

## 2016-09-12 LAB — CBC WITH DIFFERENTIAL/PLATELET
BASOS ABS: 0.1 10*3/uL (ref 0.0–0.1)
BASOS PCT: 1 %
EOS ABS: 0.3 10*3/uL (ref 0.0–0.7)
Eosinophils Relative: 3 %
HCT: 24.6 % — ABNORMAL LOW (ref 36.0–46.0)
Hemoglobin: 6.8 g/dL — CL (ref 12.0–15.0)
Lymphocytes Relative: 37 %
Lymphs Abs: 3.8 10*3/uL (ref 0.7–4.0)
MCH: 16.2 pg — AB (ref 26.0–34.0)
MCHC: 27.6 g/dL — AB (ref 30.0–36.0)
MCV: 58.4 fL — ABNORMAL LOW (ref 78.0–100.0)
MONO ABS: 0.8 10*3/uL (ref 0.1–1.0)
Monocytes Relative: 8 %
NEUTROS ABS: 5.4 10*3/uL (ref 1.7–7.7)
NEUTROS PCT: 51 %
PLATELETS: 545 10*3/uL — AB (ref 150–400)
RBC: 4.21 MIL/uL (ref 3.87–5.11)
RDW: 20.9 % — ABNORMAL HIGH (ref 11.5–15.5)
WBC: 10.4 10*3/uL (ref 4.0–10.5)

## 2016-09-12 LAB — COMPREHENSIVE METABOLIC PANEL
ALT: 18 U/L (ref 14–54)
ANION GAP: 7 (ref 5–15)
AST: 23 U/L (ref 15–41)
Albumin: 3.6 g/dL (ref 3.5–5.0)
Alkaline Phosphatase: 58 U/L (ref 38–126)
BUN: 10 mg/dL (ref 6–20)
CHLORIDE: 108 mmol/L (ref 101–111)
CO2: 23 mmol/L (ref 22–32)
Calcium: 9.2 mg/dL (ref 8.9–10.3)
Creatinine, Ser: 0.84 mg/dL (ref 0.44–1.00)
GFR calc non Af Amer: >60 mL/min (ref >60)
Glucose, Bld: 111 mg/dL — ABNORMAL HIGH (ref 65–99)
POTASSIUM: 3.6 mmol/L (ref 3.5–5.1)
SODIUM: 138 mmol/L (ref 135–145)
Total Bilirubin: 0.2 mg/dL — ABNORMAL LOW (ref 0.3–1.2)
Total Protein: 7.5 g/dL (ref 6.5–8.1)

## 2016-09-12 LAB — LIPASE, BLOOD: Lipase: 25 U/L (ref 11–51)

## 2016-09-12 LAB — URINALYSIS, ROUTINE W REFLEX MICROSCOPIC
BILIRUBIN URINE: NEGATIVE
Glucose, UA: NEGATIVE mg/dL
Hgb urine dipstick: NEGATIVE
Ketones, ur: NEGATIVE mg/dL
LEUKOCYTES UA: NEGATIVE
NITRITE: NEGATIVE
Protein, ur: 30 mg/dL — AB
SPECIFIC GRAVITY, URINE: 1.03 (ref 1.005–1.030)
pH: 6 (ref 5.0–8.0)

## 2016-09-12 LAB — PREGNANCY, URINE: PREG TEST UR: NEGATIVE

## 2016-09-12 MED ORDER — ONDANSETRON HCL 4 MG/2ML IJ SOLN
4.0000 mg | Freq: Once | INTRAMUSCULAR | Status: AC
  Administered 2016-09-12: 4 mg via INTRAVENOUS
  Filled 2016-09-12: qty 2

## 2016-09-12 MED ORDER — SODIUM CHLORIDE 0.9 % IV BOLUS (SEPSIS)
1000.0000 mL | Freq: Once | INTRAVENOUS | Status: AC
  Administered 2016-09-12: 1000 mL via INTRAVENOUS

## 2016-09-12 MED ORDER — HALOPERIDOL LACTATE 5 MG/ML IJ SOLN
2.0000 mg | Freq: Once | INTRAMUSCULAR | Status: AC
  Administered 2016-09-12: 2 mg via INTRAVENOUS
  Filled 2016-09-12: qty 1

## 2016-09-12 MED ORDER — KETOROLAC TROMETHAMINE 30 MG/ML IJ SOLN
30.0000 mg | Freq: Once | INTRAMUSCULAR | Status: AC
  Administered 2016-09-12: 30 mg via INTRAVENOUS
  Filled 2016-09-12: qty 1

## 2016-09-19 ENCOUNTER — Inpatient Hospital Stay (HOSPITAL_COMMUNITY)
Admission: AD | Admit: 2016-09-19 | Discharge: 2016-09-19 | Disposition: A | Discharge status: Home / Self Care | Payer: BLUE CROSS/BLUE SHIELD | Source: Ambulatory Visit | Attending: Obstetrics & Gynecology | Admitting: Obstetrics & Gynecology

## 2016-09-19 DIAGNOSIS — N920 Excessive and frequent menstruation with regular cycle: Secondary | ICD-10-CM | POA: Diagnosis present

## 2016-09-19 DIAGNOSIS — N939 Abnormal uterine and vaginal bleeding, unspecified: Secondary | ICD-10-CM

## 2016-09-19 DIAGNOSIS — D649 Anemia, unspecified: Secondary | ICD-10-CM

## 2016-09-19 MED ORDER — MEDROXYPROGESTERONE ACETATE 400 MG/ML IM SUSP
400.0000 mg | Freq: Once | INTRAMUSCULAR | Status: AC
  Administered 2016-09-19: 400 mg via INTRAMUSCULAR
  Filled 2016-09-19: qty 1

## 2016-09-19 NOTE — MAU Note (Signed)
Here for depo injection due to heavy periods and anemia. No pain and no other complaints

## 2016-09-30 ENCOUNTER — Other Ambulatory Visit: Payer: Self-pay | Admitting: Obstetrics

## 2016-09-30 DIAGNOSIS — N939 Abnormal uterine and vaginal bleeding, unspecified: Secondary | ICD-10-CM

## 2016-09-30 MED ORDER — MEDROXYPROGESTERONE ACETATE 150 MG/ML IM SUSP: 150.0000 mg | INTRAMUSCULAR | 0 refills | Status: DC

## 2016-10-09 ENCOUNTER — Other Ambulatory Visit: Payer: Self-pay | Admitting: Obstetrics

## 2016-10-09 ENCOUNTER — Ambulatory Visit (INDEPENDENT_AMBULATORY_CARE_PROVIDER_SITE_OTHER): Payer: BLUE CROSS/BLUE SHIELD

## 2016-10-09 DIAGNOSIS — N939 Abnormal uterine and vaginal bleeding, unspecified: Secondary | ICD-10-CM

## 2016-10-09 MED ORDER — MEDROXYPROGESTERONE ACETATE 150 MG/ML IM SUSP
150.0000 mg | Freq: Once | INTRAMUSCULAR | Status: AC
  Administered 2016-10-09: 150 mg via INTRAMUSCULAR

## 2016-10-09 NOTE — Progress Notes (Signed)
Patient presents for DEPO to control bleeding. Injection given in [facility]. Tolerated well. Patient have an appt. With Dr. Zigmund Daniel 10/24/16. Administrations This Visit    medroxyPROGESTERone (DEPO-PROVERA) injection 150 mg    Admin Date 10/09/2016 Action Given Dose 150 mg Route Intramuscular Administered By Tamela Oddi, RMA

## 2016-10-24 DIAGNOSIS — D500 Iron deficiency anemia secondary to blood loss (chronic): Secondary | ICD-10-CM | POA: Insufficient documentation

## 2016-10-24 DIAGNOSIS — N3946 Mixed incontinence: Secondary | ICD-10-CM | POA: Insufficient documentation

## 2016-11-06 ENCOUNTER — Ambulatory Visit: Payer: BLUE CROSS/BLUE SHIELD

## 2016-11-28 HISTORY — PX: ABDOMINAL HYSTERECTOMY: SHX81

## 2017-01-01 ENCOUNTER — Other Ambulatory Visit: Payer: Self-pay | Admitting: Obstetrics

## 2017-01-01 DIAGNOSIS — N939 Abnormal uterine and vaginal bleeding, unspecified: Secondary | ICD-10-CM

## 2017-01-04 ENCOUNTER — Encounter (HOSPITAL_BASED_OUTPATIENT_CLINIC_OR_DEPARTMENT_OTHER): Payer: Self-pay | Admitting: Emergency Medicine

## 2017-01-04 ENCOUNTER — Emergency Department (HOSPITAL_BASED_OUTPATIENT_CLINIC_OR_DEPARTMENT_OTHER): Payer: BLUE CROSS/BLUE SHIELD

## 2017-01-04 ENCOUNTER — Inpatient Hospital Stay (HOSPITAL_BASED_OUTPATIENT_CLINIC_OR_DEPARTMENT_OTHER)
Admission: EM | Admit: 2017-01-04 | Discharge: 2017-01-06 | DRG: 419 | Disposition: A | Discharge status: Home / Self Care | Payer: BLUE CROSS/BLUE SHIELD | Attending: General Surgery | Admitting: General Surgery

## 2017-01-04 DIAGNOSIS — G4700 Insomnia, unspecified: Secondary | ICD-10-CM | POA: Diagnosis present

## 2017-01-04 DIAGNOSIS — Z79890 Hormone replacement therapy (postmenopausal): Secondary | ICD-10-CM

## 2017-01-04 DIAGNOSIS — Z885 Allergy status to narcotic agent status: Secondary | ICD-10-CM

## 2017-01-04 DIAGNOSIS — R1011 Right upper quadrant pain: Secondary | ICD-10-CM

## 2017-01-04 DIAGNOSIS — D649 Anemia, unspecified: Secondary | ICD-10-CM

## 2017-01-04 DIAGNOSIS — Z90710 Acquired absence of both cervix and uterus: Secondary | ICD-10-CM

## 2017-01-04 DIAGNOSIS — Z22322 Carrier or suspected carrier of Methicillin resistant Staphylococcus aureus: Secondary | ICD-10-CM

## 2017-01-04 DIAGNOSIS — K812 Acute cholecystitis with chronic cholecystitis: Secondary | ICD-10-CM | POA: Diagnosis present

## 2017-01-04 DIAGNOSIS — Z419 Encounter for procedure for purposes other than remedying health state, unspecified: Secondary | ICD-10-CM

## 2017-01-04 DIAGNOSIS — R1010 Upper abdominal pain, unspecified: Secondary | ICD-10-CM | POA: Diagnosis not present

## 2017-01-04 DIAGNOSIS — R109 Unspecified abdominal pain: Secondary | ICD-10-CM

## 2017-01-04 DIAGNOSIS — Z91040 Latex allergy status: Secondary | ICD-10-CM

## 2017-01-04 DIAGNOSIS — K8010 Calculus of gallbladder with chronic cholecystitis without obstruction: Secondary | ICD-10-CM

## 2017-01-04 DIAGNOSIS — F329 Major depressive disorder, single episode, unspecified: Secondary | ICD-10-CM | POA: Diagnosis present

## 2017-01-04 DIAGNOSIS — Z79899 Other long term (current) drug therapy: Secondary | ICD-10-CM

## 2017-01-04 DIAGNOSIS — F418 Other specified anxiety disorders: Secondary | ICD-10-CM | POA: Diagnosis present

## 2017-01-04 LAB — COMPREHENSIVE METABOLIC PANEL
ALBUMIN: 4.2 g/dL (ref 3.5–5.0)
ALT: 222 U/L — AB (ref 14–54)
AST: 127 U/L — AB (ref 15–41)
Alkaline Phosphatase: 124 U/L (ref 38–126)
Anion gap: 10 (ref 5–15)
BILIRUBIN TOTAL: 1.2 mg/dL (ref 0.3–1.2)
BUN: 7 mg/dL (ref 6–20)
CHLORIDE: 105 mmol/L (ref 101–111)
CO2: 20 mmol/L — ABNORMAL LOW (ref 22–32)
CREATININE: 0.75 mg/dL (ref 0.44–1.00)
Calcium: 9.8 mg/dL (ref 8.9–10.3)
GFR calc Af Amer: >60 mL/min (ref >60)
GFR calc non Af Amer: >60 mL/min (ref >60)
GLUCOSE: 95 mg/dL (ref 65–99)
POTASSIUM: 3.7 mmol/L (ref 3.5–5.1)
Sodium: 135 mmol/L (ref 135–145)
TOTAL PROTEIN: 8.5 g/dL — AB (ref 6.5–8.1)

## 2017-01-04 LAB — URINALYSIS, ROUTINE W REFLEX MICROSCOPIC
GLUCOSE, UA: NEGATIVE mg/dL
Hgb urine dipstick: NEGATIVE
KETONES UR: 15 mg/dL — AB
Nitrite: NEGATIVE
PH: 5.5 (ref 5.0–8.0)
Protein, ur: NEGATIVE mg/dL
Specific Gravity, Urine: >1.03 — ABNORMAL HIGH (ref 1.005–1.030)

## 2017-01-04 LAB — CBC
HEMATOCRIT: 28.9 % — AB (ref 36.0–46.0)
Hemoglobin: 8 g/dL — ABNORMAL LOW (ref 12.0–15.0)
MCH: 16 pg — ABNORMAL LOW (ref 26.0–34.0)
MCHC: 27.7 g/dL — AB (ref 30.0–36.0)
MCV: 57.8 fL — AB (ref 78.0–100.0)
PLATELETS: 584 10*3/uL — AB (ref 150–400)
RBC: 5 MIL/uL (ref 3.87–5.11)
RDW: 22.4 % — AB (ref 11.5–15.5)
WBC: 8.2 10*3/uL (ref 4.0–10.5)

## 2017-01-04 LAB — LIPASE, BLOOD: Lipase: 27 U/L (ref 11–51)

## 2017-01-04 LAB — URINALYSIS, MICROSCOPIC (REFLEX): RBC / HPF: NONE SEEN RBC/hpf (ref 0–5)

## 2017-01-04 MED ORDER — KETOROLAC TROMETHAMINE 30 MG/ML IJ SOLN
30.0000 mg | Freq: Once | INTRAMUSCULAR | Status: AC
Start: 2017-01-04 — End: 2017-01-04
  Administered 2017-01-04: 30 mg via INTRAVENOUS
  Filled 2017-01-04: qty 1

## 2017-01-04 MED ORDER — ONDANSETRON HCL 4 MG/2ML IJ SOLN
4.0000 mg | Freq: Once | INTRAMUSCULAR | Status: AC
  Administered 2017-01-04: 4 mg via INTRAVENOUS
  Filled 2017-01-04: qty 2

## 2017-01-04 MED ORDER — IOPAMIDOL (ISOVUE-300) INJECTION 61%
100.0000 mL | Freq: Once | INTRAVENOUS | Status: AC | PRN
  Administered 2017-01-04: 100 mL via INTRAVENOUS

## 2017-01-04 MED ORDER — SODIUM CHLORIDE 0.9 % IV BOLUS (SEPSIS)
1000.0000 mL | Freq: Once | INTRAVENOUS | Status: AC
  Administered 2017-01-04: 1000 mL via INTRAVENOUS

## 2017-01-04 NOTE — ED Notes (Signed)
PT going POV to [facility] ED for surgical consult. Pt understands to go directly to her destination and not make any stops. Pt understands not to tamper with her IV or introduce any substance in it.

## 2017-01-04 NOTE — ED Triage Notes (Signed)
Lower abd pain, bloating, and vomiting since Thursday. Pt had hysterectomy on 8/2. Denies vaginal bleeding.

## 2017-01-04 NOTE — ED Provider Notes (Signed)
[place] DEPT [facility] Provider Note   CSN: [id number] Arrival date & time: 01/04/17  1630     History   Chief Complaint Chief Complaint  Patient presents with  . Abdominal Pain    HPI SHACOLA SCHUSSLER is a 41 y.o. female who is s/p Hysterectomy on 12/04/16 who presents with 3 weeks of Intermittent epigastric abdominal pain. Patient states that over the last 4 days, pain has become more consistent and severe, prompting ED visit. Patient also reports that over the last 4 days she has had nausea and vomiting. Emesis is nonbloody, nonbilious. She states that she has had decreased appetite secondary To symptoms. Patient states that the pain is inepigastric region and describes it as a "sharp aching pain." She does notice that the pain is worsened after eating spicy foods. Patient states that sometimes the pain is worsened when laying down but otherwise doesn't notice any positional changes associated with worsening pain. She denies any other alleviating factors. She states that she has had some radiation of pain up into the chest region But states that it usually resolves by her own. Patient has been taking regularly prescribed medications, including omeprazole with minimal improvement in pain. She has not tried any other medications. Patient states that she is still been able to pass lattice without difficulty. She reports that her last bowel movement was yesterday and was normal. The presence of blood or melena. Patient denies any fever, chills, chest pain, difficulty breathing, dysuria, hematuria, vaginal bleeding.  The history is provided by the patient.    Past Medical History:  Diagnosis Date  . Depression     Patient Active Problem List   Diagnosis Date Noted  . BV (bacterial vaginosis) 02/16/2014  . Abnormal uterine bleeding (AUB) 02/16/2014  . UTI symptoms 02/16/2014  . Leiomyoma of uterus, unspecified 08/25/2013  . Dysmenorrhea 08/25/2013  . Vaginitis and vulvovaginitis,  unspecified 08/25/2013  . 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    Past Surgical History:  Procedure Laterality Date  . CESAREAN SECTION    . CESAREAN SECTION    . HERNIA REPAIR    . TUBAL LIGATION      OB History    Gravida Para Term Preterm AB Living   8 4 3 1 4 5    SAB TAB Ectopic Multiple Live Births   2 2 0 1 5       Home Medications    Prior to Admission medications   Medication Sig Start Date End Date Taking? Authorizing Provider  ibuprofen (ADVIL,MOTRIN) 800 MG tablet Take 1 tablet (800 mg total) by mouth every 8 (eight) hours as needed for cramping. Take 1 tab po every 8 hrs as needed for pain 09/04/16  Yes Shelly Bombard, MD  FLUoxetine (PROZAC) 20 MG capsule Take 20 mg by mouth. 08/22/15 08/21/16  [provider]  medroxyPROGESTERone (PROVERA) 10 MG tablet Take 1 tablet (10 mg total) by mouth daily. Patient not taking: Reported on 09/04/2016 10/25/15   Shelly Bombard, MD  MedroxyPROGESTERone Acetate 150 MG/ML SUSY INJECT 1 ML (150 MG TOTAL) INTO THE MUSCLE EVERY 3 (THREE) MONTHS. 01/01/17   Shelly Bombard, MD  metroNIDAZOLE (METROGEL VAGINAL) 0.75 % vaginal gel Place 1 Applicatorful vaginally at bedtime. 09/08/16   Shelly Bombard, MD  oxyCODONE-acetaminophen (PERCOCET) 10-325 MG tablet Take 1 tablet by mouth every 4 (four) hours as needed for pain. Patient not taking: Reported on 09/04/2016 11/22/15   Shelly Bombard, MD  valACYclovir (VALTREX) 500 MG tablet Take 1  tablet (500 mg total) by mouth 2 (two) times daily. 09/04/16   Shelly Bombard, MD    Family History Family History  Problem Relation Age of Onset  . Diabetes Maternal Grandfather     Social History Social History  Substance Use Topics  . Smoking status: Current Every Day Smoker    Packs/day: 0.25    Types: Cigarettes  . Smokeless tobacco: Never Used  . Alcohol use No     Allergies   Latex   Review of Systems Review of Systems  Constitutional: Positive for appetite change.  Negative for chills and fever.  HENT: Negative for congestion.   Eyes: Negative for visual disturbance.  Respiratory: Negative for cough and shortness of breath.   Cardiovascular: Negative for chest pain.  Gastrointestinal: Positive for abdominal pain, nausea and vomiting. Negative for blood in stool, constipation and diarrhea.  Genitourinary: Negative for dysuria, hematuria, pelvic pain, vaginal bleeding and vaginal discharge.  Musculoskeletal: Negative for back pain and neck pain.     Physical Exam Updated Vital Signs BP (!) 132/107 (BP Location: Left Arm)   Pulse (!) 103   Temp 98.4 F (36.9 C) (Oral)   Resp 20   Ht 5\' 2"  (1.575 m)   Wt 81.6 kg (180 lb)   LMP 08/31/2016   SpO2 100%   BMI 32.92 kg/m   Physical Exam  Constitutional: She is oriented to person, place, and time. She appears well-developed and well-nourished.  Sitting comfortably on examination table  HENT:  Head: Normocephalic and atraumatic.  Mouth/Throat: Oropharynx is clear and moist and mucous membranes are normal.  Eyes: Pupils are equal, round, and reactive to light. Conjunctivae, EOM and lids are normal.  Neck: Full passive range of motion without pain.  Cardiovascular: Normal rate, regular rhythm, normal heart sounds and normal pulses.  Exam reveals no gallop and no friction rub.   No murmur heard. Pulmonary/Chest: Effort normal and breath sounds normal.  Abdominal: Soft. Normal appearance. There is tenderness in the epigastric area and left upper quadrant. There is guarding (Voluntary). There is no rigidity, no CVA tenderness, no tenderness at McBurney's point and negative Murphy's sign.  Abdomen is soft, nondistended. Patient has some tenderness palpation to the epigastric and left upper quadrant region. Negative McBurney's point tenderness. Patient with negative Murphy's sign. No CVA tenderness bilaterally.  Musculoskeletal: Normal range of motion.  Neurological: She is alert and oriented to person,  place, and time.  Skin: Skin is warm and dry. Capillary refill takes less than 2 seconds.  Psychiatric: She has a normal mood and affect. Her speech is normal.  Nursing note and vitals reviewed.    ED Treatments / Results  Labs (all labs ordered are listed, but only abnormal results are displayed) Labs Reviewed  COMPREHENSIVE METABOLIC PANEL - Abnormal; Notable for the following:       Result Value   CO2 20 (*)    Total Protein 8.5 (*)    AST 127 (*)    ALT 222 (*)    All other components within normal limits  CBC - Abnormal; Notable for the following:    Hemoglobin 8.0 (*)    HCT 28.9 (*)    MCV 57.8 (*)    MCH 16.0 (*)    MCHC 27.7 (*)    RDW 22.4 (*)    Platelets 584 (*)    All other components within normal limits  URINALYSIS, ROUTINE W REFLEX MICROSCOPIC - Abnormal; Notable for the following:    APPearance  HAZY (*)    Specific Gravity, Urine >1.030 (*)    Bilirubin Urine SMALL (*)    Ketones, ur 15 (*)    Leukocytes, UA SMALL (*)    All other components within normal limits  URINALYSIS, MICROSCOPIC (REFLEX) - Abnormal; Notable for the following:    Bacteria, UA RARE (*)    Squamous Epithelial / LPF 6-30 (*)    All other components within normal limits  LIPASE, BLOOD    EKG  EKG Interpretation None       Radiology Ct Abdomen Pelvis W Contrast  Result Date: 01/04/2017 CLINICAL DATA:  Epigastric pain for 4 days. Patient status post hysterectomy August 2nd 2018. EXAM: CT ABDOMEN AND PELVIS WITH CONTRAST TECHNIQUE: Multidetector CT imaging of the abdomen and pelvis was performed using the standard protocol following bolus administration of intravenous contrast. CONTRAST:  140mL ISOVUE-300 IOPAMIDOL (ISOVUE-300) INJECTION 61% COMPARISON:  Sep 12, 2016 FINDINGS: Lower chest: Mild dependent atelectasis of both lung bases are noted. The heart size is normal. Hepatobiliary: The liver is normal without focal liver lesion. There is gallbladder wall thickening with  pericholecystic fluid and inflammation. Possible gallstone or sludge noted. The biliary tree is normal. Pancreas: Unremarkable. No pancreatic ductal dilatation or surrounding inflammatory changes. Spleen: No acute abnormalities identified in the spleen. Adrenals/Urinary Tract: Adrenal glands are unremarkable. Kidneys are normal, without renal calculi, focal lesion, or hydronephrosis. Bladder is unremarkable. Stomach/Bowel: The stomach is normal. There is no small bowel obstruction. The appendix is normal. There is mild bowel wall thickening of the sigmoid colon and rectum. This may be postsurgical inflammation/change. Vascular/Lymphatic: No significant vascular findings are present. No enlarged abdominal or pelvic lymph nodes. Reproductive: Patient status post prior hysterectomy. There is mild inflammation and stranding near the cervix likely postsurgical change. Other: None. Musculoskeletal: No acute or significant osseous findings. IMPRESSION: Findings suspicious for cholecystitis. Patient status post prior hysterectomy. There is mild inflammation and stranding near the cervix likely postsurgical change. There is mild bowel wall thickening of the sigmoid colon and rectum. This may be postsurgical inflammation/change. Consider follow-up CT of the pelvis in 2-3 months to ensure resolution and to ensure no persistent bowel wall thickening is present. Electronically Signed   By: Abelardo Diesel M.D.   On: 01/04/2017 20:07    Procedures Procedures (including critical care time)  Medications Ordered in ED Medications  ondansetron (ZOFRAN) injection 4 mg (not administered)  sodium chloride 0.9 % bolus 1,000 mL (0 mLs Intravenous Stopped 01/04/17 1902)  ondansetron (ZOFRAN) injection 4 mg (4 mg Intravenous Given 01/04/17 1735)  ketorolac (TORADOL) 30 MG/ML injection 30 mg (30 mg Intravenous Given 01/04/17 1735)  iopamidol (ISOVUE-300) 61 % injection 100 mL (100 mLs Intravenous Contrast Given 01/04/17 1939)      Initial Impression / Assessment and Plan / ED Course  I have reviewed the triage vital signs and the nursing notes.  Pertinent labs & imaging results that were available during my care of the patient were reviewed by me and considered in my medical decision making (see chart for details).     41 year old female who presents with abdominal pain that has been ongoing for the last several weeks, worse in last 3 days. Associated with nausea/vomiting. Patient able to have a bowel movement yesterday and slightly opacified. Recent hysterectomy on 12/04/16. Subjective fever yesterday but no recorded temperature. Patient is afebrile, non-toxic appearing, sitting comfortably on examination table. Vital signs reviewed and stable. Consider acute infectious etiology versus GERD. Plan to check basic labs  including CBC, CMP, lipase, UA. IVF given for fluid resuscitation. Analgesics provided in the department. Given recent hysterectomy, concern for intra-abdominal pathology. Will obtain CT scan for further evaluation of acute infectious etiology.  Labs and imaging reviewed. CMP shows elevated AST and ALT at 127 and 222. CBC shows no leukocytosis. CBC also shows anemia. Records reviewed to this consistent with previous. Lipase is unremarkable. UA shows small bilirubin, small leukocytes.  CT abdomen and pelvis reviewed. There is findings suspicious for cholecystitis. There is mention of gallbladder wall thickening and pericholecystic fluid and inflammation. They note possible gallstones or sludge. Biliary tree is normal. Plan to evaluate with right upper quadrant ultrasound.  Right upper quadrant ultrasound was canceled secondary to ultrasound tech not being at facility. Will contact surgeon for further medication.  Discussed with Dr. Marlou Starks (Surgery). Recommends transfer to [facility] emergency department where he will come down and evaluate patient in the ED. He recommends that on ED arrival, patient be started  on Zosyn.  Discussed with Dr. Ashok Cordia (ED). Will accept patient for transfer to ED. Will plan to consult surgery on ED arrival.  Discussed with patient. She will go to [facility] Emergency Department by POV. Patient is stable with reassuring vital signs. Instructed her to go directly to the emergency department where she'll be evaluated by surgery.  Final Clinical Impressions(s) / ED Diagnoses   Final diagnoses:  Abdominal pain  Pain of upper abdomen    New Prescriptions New Prescriptions   No medications on file     Desma Mcgregor 01/05/17 0330    Blanchie Dessert, MD 01/05/17 [id number]

## 2017-01-05 ENCOUNTER — Inpatient Hospital Stay (HOSPITAL_COMMUNITY): Payer: BLUE CROSS/BLUE SHIELD | Admitting: Registered Nurse

## 2017-01-05 ENCOUNTER — Emergency Department (HOSPITAL_COMMUNITY): Payer: BLUE CROSS/BLUE SHIELD

## 2017-01-05 ENCOUNTER — Encounter (HOSPITAL_COMMUNITY): Payer: Self-pay

## 2017-01-05 ENCOUNTER — Encounter (HOSPITAL_COMMUNITY): Admission: EM | Disposition: A | Discharge status: Home / Self Care | Payer: Self-pay | Source: Home / Self Care

## 2017-01-05 ENCOUNTER — Inpatient Hospital Stay (HOSPITAL_COMMUNITY): Payer: BLUE CROSS/BLUE SHIELD

## 2017-01-05 DIAGNOSIS — R1010 Upper abdominal pain, unspecified: Secondary | ICD-10-CM | POA: Diagnosis present

## 2017-01-05 DIAGNOSIS — K812 Acute cholecystitis with chronic cholecystitis: Secondary | ICD-10-CM | POA: Diagnosis present

## 2017-01-05 DIAGNOSIS — G4700 Insomnia, unspecified: Secondary | ICD-10-CM | POA: Diagnosis present

## 2017-01-05 DIAGNOSIS — Z79899 Other long term (current) drug therapy: Secondary | ICD-10-CM | POA: Diagnosis not present

## 2017-01-05 DIAGNOSIS — Z79890 Hormone replacement therapy (postmenopausal): Secondary | ICD-10-CM | POA: Diagnosis not present

## 2017-01-05 DIAGNOSIS — Z90710 Acquired absence of both cervix and uterus: Secondary | ICD-10-CM | POA: Diagnosis not present

## 2017-01-05 DIAGNOSIS — F418 Other specified anxiety disorders: Secondary | ICD-10-CM | POA: Diagnosis present

## 2017-01-05 DIAGNOSIS — D649 Anemia, unspecified: Secondary | ICD-10-CM

## 2017-01-05 DIAGNOSIS — Z22322 Carrier or suspected carrier of Methicillin resistant Staphylococcus aureus: Secondary | ICD-10-CM | POA: Diagnosis not present

## 2017-01-05 DIAGNOSIS — Z91040 Latex allergy status: Secondary | ICD-10-CM | POA: Diagnosis not present

## 2017-01-05 DIAGNOSIS — Z885 Allergy status to narcotic agent status: Secondary | ICD-10-CM | POA: Diagnosis not present

## 2017-01-05 DIAGNOSIS — F329 Major depressive disorder, single episode, unspecified: Secondary | ICD-10-CM | POA: Diagnosis present

## 2017-01-05 HISTORY — PX: CHOLECYSTECTOMY: SHX55

## 2017-01-05 LAB — COMPREHENSIVE METABOLIC PANEL
ALBUMIN: 3.7 g/dL (ref 3.5–5.0)
ALT: 186 U/L — AB (ref 14–54)
AST: 99 U/L — AB (ref 15–41)
Alkaline Phosphatase: 103 U/L (ref 38–126)
Anion gap: 8 (ref 5–15)
BILIRUBIN TOTAL: 1 mg/dL (ref 0.3–1.2)
BUN: 9 mg/dL (ref 6–20)
CO2: 22 mmol/L (ref 22–32)
CREATININE: 0.77 mg/dL (ref 0.44–1.00)
Calcium: 9.4 mg/dL (ref 8.9–10.3)
Chloride: 106 mmol/L (ref 101–111)
GFR calc Af Amer: >60 mL/min (ref >60)
GLUCOSE: 98 mg/dL (ref 65–99)
Potassium: 3.5 mmol/L (ref 3.5–5.1)
Sodium: 136 mmol/L (ref 135–145)
Total Protein: 7.8 g/dL (ref 6.5–8.1)

## 2017-01-05 LAB — PREPARE RBC (CROSSMATCH)

## 2017-01-05 LAB — CBC
HEMATOCRIT: 26.6 % — AB (ref 36.0–46.0)
Hemoglobin: 7.2 g/dL — ABNORMAL LOW (ref 12.0–15.0)
MCH: 15.9 pg — AB (ref 26.0–34.0)
MCHC: 27.1 g/dL — AB (ref 30.0–36.0)
MCV: 58.6 fL — AB (ref 78.0–100.0)
PLATELETS: 480 10*3/uL — AB (ref 150–400)
RBC: 4.54 MIL/uL (ref 3.87–5.11)
RDW: 20.5 % — AB (ref 11.5–15.5)
WBC: 8.7 10*3/uL (ref 4.0–10.5)

## 2017-01-05 LAB — SURGICAL PCR SCREEN
MRSA, PCR: POSITIVE — AB
Staphylococcus aureus: POSITIVE — AB

## 2017-01-05 SURGERY — LAPAROSCOPIC CHOLECYSTECTOMY WITH INTRAOPERATIVE CHOLANGIOGRAM
Anesthesia: General

## 2017-01-05 MED ORDER — VALACYCLOVIR HCL 500 MG PO TABS
500.0000 mg | ORAL_TABLET | Freq: Two times a day (BID) | ORAL | Status: DC
  Filled 2017-01-05 (×2): qty 1

## 2017-01-05 MED ORDER — LIDOCAINE 2% (20 MG/ML) 5 ML SYRINGE
INTRAMUSCULAR | Status: DC | PRN
  Administered 2017-01-05: 80 mg via INTRAVENOUS

## 2017-01-05 MED ORDER — ONDANSETRON HCL 4 MG/2ML IJ SOLN
INTRAMUSCULAR | Status: DC | PRN
  Administered 2017-01-05: 4 mg via INTRAVENOUS

## 2017-01-05 MED ORDER — ROCURONIUM BROMIDE 10 MG/ML (PF) SYRINGE
PREFILLED_SYRINGE | INTRAVENOUS | Status: DC | PRN
  Administered 2017-01-05: 50 mg via INTRAVENOUS

## 2017-01-05 MED ORDER — GUAIFENESIN-DM 100-10 MG/5ML PO SYRP: 10.0000 mL | ORAL_SOLUTION | ORAL | Status: DC | PRN

## 2017-01-05 MED ORDER — OXYCODONE HCL 5 MG/5ML PO SOLN: 5.0000 mg | Freq: Once | ORAL | Status: AC | PRN

## 2017-01-05 MED ORDER — OXYCODONE HCL 5 MG PO TABS
5.0000 mg | ORAL_TABLET | ORAL | Status: DC | PRN
  Administered 2017-01-05 – 2017-01-06 (×4): 10 mg via ORAL
  Filled 2017-01-05 (×4): qty 2

## 2017-01-05 MED ORDER — ACETAMINOPHEN 650 MG RE SUPP: 650.0000 mg | Freq: Four times a day (QID) | RECTAL | Status: DC | PRN

## 2017-01-05 MED ORDER — HYDROMORPHONE HCL-NACL 0.5-0.9 MG/ML-% IV SOSY
0.2500 mg | PREFILLED_SYRINGE | INTRAVENOUS | Status: DC | PRN
  Administered 2017-01-05 (×4): 0.5 mg via INTRAVENOUS

## 2017-01-05 MED ORDER — POLYETHYLENE GLYCOL 3350 17 G PO PACK: 17.0000 g | PACK | Freq: Every day | ORAL | Status: DC | PRN

## 2017-01-05 MED ORDER — ALBUMIN HUMAN 5 % IV SOLN
INTRAVENOUS | Status: AC
Start: 2017-01-05 — End: 2017-01-05
  Filled 2017-01-05: qty 250

## 2017-01-05 MED ORDER — LIDOCAINE 2% (20 MG/ML) 5 ML SYRINGE
INTRAMUSCULAR | Status: AC
  Filled 2017-01-05: qty 5

## 2017-01-05 MED ORDER — PROPOFOL 10 MG/ML IV BOLUS
INTRAVENOUS | Status: DC | PRN
  Administered 2017-01-05: 200 mg via INTRAVENOUS

## 2017-01-05 MED ORDER — LACTATED RINGERS IR SOLN
Status: DC | PRN
  Administered 2017-01-05: 1000 mL

## 2017-01-05 MED ORDER — HYDROCORTISONE 1 % EX CREA
1.0000 "application " | TOPICAL_CREAM | Freq: Three times a day (TID) | CUTANEOUS | Status: DC | PRN
  Administered 2017-01-06: 1 via TOPICAL
  Filled 2017-01-05: qty 28

## 2017-01-05 MED ORDER — BUPIVACAINE-EPINEPHRINE 0.25% -1:200000 IJ SOLN
INTRAMUSCULAR | Status: DC | PRN
  Administered 2017-01-05: 60 mL

## 2017-01-05 MED ORDER — 0.9 % SODIUM CHLORIDE (POUR BTL) OPTIME
TOPICAL | Status: DC | PRN
  Administered 2017-01-05: 2000 mL
  Administered 2017-01-05: 1000 mL

## 2017-01-05 MED ORDER — MIDAZOLAM HCL 5 MG/5ML IJ SOLN
INTRAMUSCULAR | Status: DC | PRN
  Administered 2017-01-05: 2 mg via INTRAVENOUS

## 2017-01-05 MED ORDER — ENALAPRILAT 1.25 MG/ML IV SOLN
0.6250 mg | Freq: Four times a day (QID) | INTRAVENOUS | Status: DC | PRN
  Filled 2017-01-05: qty 1

## 2017-01-05 MED ORDER — CELECOXIB 200 MG PO CAPS
400.0000 mg | ORAL_CAPSULE | ORAL | Status: AC
  Administered 2017-01-05: 400 mg via ORAL
  Filled 2017-01-05: qty 2

## 2017-01-05 MED ORDER — SODIUM CHLORIDE 0.9 % IV SOLN: 250.0000 mL | INTRAVENOUS | Status: DC | PRN

## 2017-01-05 MED ORDER — ALUM & MAG HYDROXIDE-SIMETH 200-200-20 MG/5ML PO SUSP
30.0000 mL | Freq: Four times a day (QID) | ORAL | Status: DC | PRN
  Administered 2017-01-05: 17:00:00 30 mL via ORAL
  Filled 2017-01-05: qty 30

## 2017-01-05 MED ORDER — FENTANYL CITRATE (PF) 250 MCG/5ML IJ SOLN
INTRAMUSCULAR | Status: AC
Start: 2017-01-05 — End: 2017-01-05
  Filled 2017-01-05: qty 5

## 2017-01-05 MED ORDER — STERILE WATER FOR IRRIGATION IR SOLN
Status: DC | PRN
  Administered 2017-01-05: 1000 mL

## 2017-01-05 MED ORDER — CHLORHEXIDINE GLUCONATE CLOTH 2 % EX PADS
6.0000 | MEDICATED_PAD | Freq: Every day | CUTANEOUS | Status: DC
  Administered 2017-01-06: 6 via TOPICAL

## 2017-01-05 MED ORDER — MORPHINE SULFATE (PF) 2 MG/ML IV SOLN
1.0000 mg | INTRAVENOUS | Status: DC | PRN
  Administered 2017-01-05: 2 mg via INTRAVENOUS
  Filled 2017-01-05: qty 1

## 2017-01-05 MED ORDER — LIP MEDEX EX OINT
TOPICAL_OINTMENT | CUTANEOUS | Status: AC
  Filled 2017-01-05: qty 7

## 2017-01-05 MED ORDER — LIP MEDEX EX OINT
1.0000 "application " | TOPICAL_OINTMENT | Freq: Two times a day (BID) | CUTANEOUS | Status: DC
  Administered 2017-01-05 – 2017-01-06 (×2): 1 via TOPICAL

## 2017-01-05 MED ORDER — ONDANSETRON HCL 4 MG/2ML IJ SOLN: 4.0000 mg | Freq: Four times a day (QID) | INTRAMUSCULAR | Status: DC | PRN

## 2017-01-05 MED ORDER — OXYCODONE HCL 5 MG PO TABS
5.0000 mg | ORAL_TABLET | Freq: Once | ORAL | Status: AC | PRN
  Administered 2017-01-05: 5 mg via ORAL

## 2017-01-05 MED ORDER — SODIUM CHLORIDE 0.9% FLUSH: 3.0000 mL | INTRAVENOUS | Status: DC | PRN

## 2017-01-05 MED ORDER — NAPROXEN 250 MG PO TABS
500.0000 mg | ORAL_TABLET | Freq: Two times a day (BID) | ORAL | Status: DC
  Administered 2017-01-06: 500 mg via ORAL
  Filled 2017-01-05 (×3): qty 2

## 2017-01-05 MED ORDER — METOPROLOL TARTRATE 5 MG/5ML IV SOLN: 5.0000 mg | Freq: Four times a day (QID) | INTRAVENOUS | Status: DC | PRN

## 2017-01-05 MED ORDER — PSYLLIUM 95 % PO PACK
1.0000 | PACK | Freq: Every day | ORAL | Status: DC
  Administered 2017-01-06: 1 via ORAL
  Filled 2017-01-05 (×2): qty 1

## 2017-01-05 MED ORDER — PROCHLORPERAZINE EDISYLATE 5 MG/ML IJ SOLN: 5.0000 mg | INTRAMUSCULAR | Status: DC | PRN

## 2017-01-05 MED ORDER — ONDANSETRON HCL 4 MG/2ML IJ SOLN
4.0000 mg | Freq: Four times a day (QID) | INTRAMUSCULAR | Status: AC | PRN
  Administered 2017-01-05: 4 mg via INTRAVENOUS

## 2017-01-05 MED ORDER — BUPIVACAINE-EPINEPHRINE (PF) 0.25% -1:200000 IJ SOLN
INTRAMUSCULAR | Status: AC
  Filled 2017-01-05: qty 30

## 2017-01-05 MED ORDER — ROCURONIUM BROMIDE 50 MG/5ML IV SOSY
PREFILLED_SYRINGE | INTRAVENOUS | Status: AC
  Filled 2017-01-05: qty 5

## 2017-01-05 MED ORDER — DIPHENHYDRAMINE HCL 50 MG/ML IJ SOLN
12.5000 mg | Freq: Four times a day (QID) | INTRAMUSCULAR | Status: DC | PRN
  Administered 2017-01-05 (×2): 12.5 mg via INTRAVENOUS
  Administered 2017-01-06: 02:00:00 25 mg via INTRAVENOUS
  Administered 2017-01-06: 08:00:00 12.5 mg via INTRAVENOUS
  Filled 2017-01-05 (×4): qty 1

## 2017-01-05 MED ORDER — LACTATED RINGERS IV BOLUS (SEPSIS): 1000.0000 mL | Freq: Three times a day (TID) | INTRAVENOUS | Status: DC | PRN

## 2017-01-05 MED ORDER — HYDROCORTISONE 2.5 % RE CREA
1.0000 "application " | TOPICAL_CREAM | Freq: Four times a day (QID) | RECTAL | Status: DC | PRN
  Filled 2017-01-05: qty 28.35

## 2017-01-05 MED ORDER — METOCLOPRAMIDE HCL 5 MG/ML IJ SOLN
5.0000 mg | Freq: Four times a day (QID) | INTRAMUSCULAR | Status: DC | PRN
  Administered 2017-01-05: 16:00:00 10 mg via INTRAVENOUS
  Filled 2017-01-05: qty 2

## 2017-01-05 MED ORDER — DEXTROSE 5 % IV SOLN
2.0000 g | INTRAVENOUS | Status: DC
  Administered 2017-01-05: 04:00:00 2 g via INTRAVENOUS
  Filled 2017-01-05: qty 2

## 2017-01-05 MED ORDER — SCOPOLAMINE 1 MG/3DAYS TD PT72
MEDICATED_PATCH | TRANSDERMAL | Status: DC | PRN
  Administered 2017-01-05: 1 via TRANSDERMAL

## 2017-01-05 MED ORDER — HYDROMORPHONE HCL-NACL 0.5-0.9 MG/ML-% IV SOSY
PREFILLED_SYRINGE | INTRAVENOUS | Status: AC
  Administered 2017-01-05: 0.5 mg via INTRAVENOUS
  Filled 2017-01-05: qty 1

## 2017-01-05 MED ORDER — LACTATED RINGERS IV SOLN
INTRAVENOUS | Status: DC | PRN
  Administered 2017-01-05: 12:00:00 via INTRAVENOUS

## 2017-01-05 MED ORDER — PANTOPRAZOLE SODIUM 40 MG PO TBEC
80.0000 mg | DELAYED_RELEASE_TABLET | Freq: Every day | ORAL | Status: DC
  Administered 2017-01-06: 80 mg via ORAL
  Filled 2017-01-05: qty 2

## 2017-01-05 MED ORDER — FENTANYL CITRATE (PF) 100 MCG/2ML IJ SOLN
INTRAMUSCULAR | Status: DC | PRN
  Administered 2017-01-05 (×3): 50 ug via INTRAVENOUS
  Administered 2017-01-05: 100 ug via INTRAVENOUS

## 2017-01-05 MED ORDER — LACTATED RINGERS IV SOLN: INTRAVENOUS | Status: DC

## 2017-01-05 MED ORDER — IOPAMIDOL (ISOVUE-300) INJECTION 61%
INTRAVENOUS | Status: AC
  Filled 2017-01-05: qty 50

## 2017-01-05 MED ORDER — ALBUMIN HUMAN 5 % IV SOLN
INTRAVENOUS | Status: DC | PRN
  Administered 2017-01-05: 14:00:00 via INTRAVENOUS

## 2017-01-05 MED ORDER — ACETAMINOPHEN 325 MG PO TABS
325.0000 mg | ORAL_TABLET | Freq: Four times a day (QID) | ORAL | Status: DC | PRN
  Administered 2017-01-06: 650 mg via ORAL
  Filled 2017-01-05: qty 2

## 2017-01-05 MED ORDER — KCL IN DEXTROSE-NACL 20-5-0.9 MEQ/L-%-% IV SOLN
INTRAVENOUS | Status: DC
  Administered 2017-01-05: 04:00:00 via INTRAVENOUS
  Filled 2017-01-05 (×2): qty 1000

## 2017-01-05 MED ORDER — HYDROMORPHONE HCL-NACL 0.5-0.9 MG/ML-% IV SOSY
0.5000 mg | PREFILLED_SYRINGE | INTRAVENOUS | Status: DC | PRN
Start: 2017-01-05 — End: 2017-01-06
  Administered 2017-01-05 (×2): 1 mg via INTRAVENOUS
  Filled 2017-01-05 (×2): qty 2

## 2017-01-05 MED ORDER — CHLORHEXIDINE GLUCONATE CLOTH 2 % EX PADS
6.0000 | MEDICATED_PAD | Freq: Once | CUTANEOUS | Status: AC
  Administered 2017-01-05: 10:00:00 6 via TOPICAL

## 2017-01-05 MED ORDER — ONDANSETRON HCL 4 MG/2ML IJ SOLN
INTRAMUSCULAR | Status: AC
  Filled 2017-01-05: qty 2

## 2017-01-05 MED ORDER — MIDAZOLAM HCL 2 MG/2ML IJ SOLN
INTRAMUSCULAR | Status: AC
  Filled 2017-01-05: qty 2

## 2017-01-05 MED ORDER — METRONIDAZOLE IN NACL 5-0.79 MG/ML-% IV SOLN
500.0000 mg | Freq: Three times a day (TID) | INTRAVENOUS | Status: DC
  Administered 2017-01-05: 500 mg via INTRAVENOUS
  Filled 2017-01-05 (×7): qty 100

## 2017-01-05 MED ORDER — PANTOPRAZOLE SODIUM 40 MG IV SOLR: 40.0000 mg | Freq: Every day | INTRAVENOUS | Status: DC

## 2017-01-05 MED ORDER — SACCHAROMYCES BOULARDII 250 MG PO CAPS
250.0000 mg | ORAL_CAPSULE | Freq: Two times a day (BID) | ORAL | Status: DC
  Administered 2017-01-05 – 2017-01-06 (×2): 250 mg via ORAL
  Filled 2017-01-05 (×2): qty 1

## 2017-01-05 MED ORDER — DIPHENHYDRAMINE HCL 25 MG PO CAPS: 50.0000 mg | ORAL_CAPSULE | Freq: Every evening | ORAL | Status: DC | PRN

## 2017-01-05 MED ORDER — METHOCARBAMOL 500 MG PO TABS: 1000.0000 mg | ORAL_TABLET | Freq: Four times a day (QID) | ORAL | Status: DC | PRN

## 2017-01-05 MED ORDER — PHENOL 1.4 % MT LIQD: 1.0000 | OROMUCOSAL | Status: DC | PRN

## 2017-01-05 MED ORDER — DEXTROSE 5 % IV SOLN
1000.0000 mg | Freq: Four times a day (QID) | INTRAVENOUS | Status: DC | PRN
  Filled 2017-01-05: qty 10

## 2017-01-05 MED ORDER — HYDRALAZINE HCL 20 MG/ML IJ SOLN
5.0000 mg | Freq: Four times a day (QID) | INTRAMUSCULAR | Status: DC | PRN
  Filled 2017-01-05: qty 1

## 2017-01-05 MED ORDER — SUGAMMADEX SODIUM 200 MG/2ML IV SOLN
INTRAVENOUS | Status: DC | PRN
  Administered 2017-01-05: 170 mg via INTRAVENOUS

## 2017-01-05 MED ORDER — ENSURE SURGERY PO LIQD
237.0000 mL | Freq: Two times a day (BID) | ORAL | Status: DC
  Filled 2017-01-05 (×3): qty 237

## 2017-01-05 MED ORDER — CHLORHEXIDINE GLUCONATE CLOTH 2 % EX PADS: 6.0000 | MEDICATED_PAD | Freq: Once | CUTANEOUS | Status: DC

## 2017-01-05 MED ORDER — ACETAMINOPHEN 500 MG PO TABS
1000.0000 mg | ORAL_TABLET | ORAL | Status: AC
  Administered 2017-01-05: 1000 mg via ORAL
  Filled 2017-01-05: qty 2

## 2017-01-05 MED ORDER — DEXAMETHASONE SODIUM PHOSPHATE 10 MG/ML IJ SOLN
INTRAMUSCULAR | Status: DC | PRN
  Administered 2017-01-05: 10 mg via INTRAVENOUS

## 2017-01-05 MED ORDER — DIPHENHYDRAMINE-APAP (SLEEP) 25-500 MG PO TABS: 2.0000 | ORAL_TABLET | Freq: Every evening | ORAL | Status: DC | PRN

## 2017-01-05 MED ORDER — GABAPENTIN 300 MG PO CAPS
300.0000 mg | ORAL_CAPSULE | Freq: Two times a day (BID) | ORAL | Status: DC
  Administered 2017-01-05 – 2017-01-06 (×2): 300 mg via ORAL
  Filled 2017-01-05 (×2): qty 1

## 2017-01-05 MED ORDER — PROPOFOL 10 MG/ML IV BOLUS
INTRAVENOUS | Status: AC
  Filled 2017-01-05: qty 20

## 2017-01-05 MED ORDER — MAGIC MOUTHWASH
15.0000 mL | Freq: Four times a day (QID) | ORAL | Status: DC | PRN
  Filled 2017-01-05: qty 15

## 2017-01-05 MED ORDER — HYDROMORPHONE HCL-NACL 0.5-0.9 MG/ML-% IV SOSY
PREFILLED_SYRINGE | INTRAVENOUS | Status: AC
  Administered 2017-01-05: 0.5 mg via INTRAVENOUS
  Filled 2017-01-05: qty 3

## 2017-01-05 MED ORDER — DEXAMETHASONE SODIUM PHOSPHATE 10 MG/ML IJ SOLN
INTRAMUSCULAR | Status: AC
  Filled 2017-01-05: qty 1

## 2017-01-05 MED ORDER — MENTHOL 3 MG MT LOZG: 1.0000 | LOZENGE | OROMUCOSAL | Status: DC | PRN

## 2017-01-05 MED ORDER — SODIUM CHLORIDE 0.9% FLUSH
3.0000 mL | Freq: Two times a day (BID) | INTRAVENOUS | Status: DC
  Administered 2017-01-05: 22:00:00 3 mL via INTRAVENOUS

## 2017-01-05 MED ORDER — MUPIROCIN 2 % EX OINT
1.0000 "application " | TOPICAL_OINTMENT | Freq: Two times a day (BID) | CUTANEOUS | Status: DC
  Administered 2017-01-05 – 2017-01-06 (×2): 1 via NASAL
  Filled 2017-01-05: qty 22

## 2017-01-05 MED ORDER — IOPAMIDOL (ISOVUE-300) INJECTION 61%
INTRAVENOUS | Status: DC | PRN
  Administered 2017-01-05: 13 mL

## 2017-01-05 MED ORDER — GABAPENTIN 300 MG PO CAPS
300.0000 mg | ORAL_CAPSULE | ORAL | Status: AC
  Administered 2017-01-05: 300 mg via ORAL
  Filled 2017-01-05: qty 1

## 2017-01-05 MED ORDER — SUGAMMADEX SODIUM 200 MG/2ML IV SOLN
INTRAVENOUS | Status: AC
Start: 2017-01-05 — End: 2017-01-05
  Filled 2017-01-05: qty 2

## 2017-01-05 MED ORDER — LORAZEPAM 2 MG/ML IJ SOLN: 0.5000 mg | Freq: Three times a day (TID) | INTRAMUSCULAR | Status: DC | PRN

## 2017-01-05 MED ORDER — ONDANSETRON HCL 4 MG/2ML IJ SOLN
INTRAMUSCULAR | Status: AC
  Administered 2017-01-05: 4 mg via INTRAVENOUS
  Filled 2017-01-05: qty 2

## 2017-01-05 MED ORDER — SCOPOLAMINE 1 MG/3DAYS TD PT72
MEDICATED_PATCH | TRANSDERMAL | Status: AC
  Filled 2017-01-05: qty 1

## 2017-01-05 MED ORDER — ONDANSETRON 4 MG PO TBDP: 4.0000 mg | ORAL_TABLET | Freq: Four times a day (QID) | ORAL | Status: DC | PRN

## 2017-01-05 MED ORDER — OXYCODONE HCL 5 MG PO TABS
ORAL_TABLET | ORAL | Status: AC
  Filled 2017-01-05: qty 1

## 2017-01-05 MED ORDER — BISACODYL 10 MG RE SUPP: 10.0000 mg | Freq: Two times a day (BID) | RECTAL | Status: DC | PRN

## 2017-01-05 MED ORDER — DEXTROSE 5 % IV SOLN: 2.0000 g | INTRAVENOUS | Status: DC

## 2017-01-05 SURGICAL SUPPLY — 44 items
APPLIER CLIP 5 13 M/L LIGAMAX5 (MISCELLANEOUS)
APPLIER CLIP ROT 10 11.4 M/L (STAPLE)
APR CLP MED LRG 11.4X10 (STAPLE)
APR CLP MED LRG 5 ANG JAW (MISCELLANEOUS)
BAG SPEC RTRVL 10 TROC 200 (ENDOMECHANICALS) ×1
BAG SPEC RTRVL LRG 6X4 10 (ENDOMECHANICALS)
CABLE HIGH FREQUENCY MONO STRZ (ELECTRODE) ×2 IMPLANT
CLIP APPLIE 5 13 M/L LIGAMAX5 (MISCELLANEOUS) IMPLANT
CLIP APPLIE ROT 10 11.4 M/L (STAPLE) IMPLANT
COVER MAYO STAND STRL (DRAPES) ×3 IMPLANT
COVER SURGICAL LIGHT HANDLE (MISCELLANEOUS) ×3 IMPLANT
DECANTER SPIKE VIAL GLASS SM (MISCELLANEOUS) ×3 IMPLANT
DRAPE C-ARM 42X120 X-RAY (DRAPES) ×3 IMPLANT
DRAPE UTILITY XL STRL (DRAPES) ×3 IMPLANT
DRAPE WARM FLUID 44X44 (DRAPE) ×3 IMPLANT
DRSG TEGADERM 2-3/8X2-3/4 SM (GAUZE/BANDAGES/DRESSINGS) ×5 IMPLANT
DRSG TEGADERM 4X4.75 (GAUZE/BANDAGES/DRESSINGS) ×3 IMPLANT
ELECT REM PT RETURN 15FT ADLT (MISCELLANEOUS) ×3 IMPLANT
ENDOLOOP SUT PDS II  0 18 (SUTURE) ×4
ENDOLOOP SUT PDS II 0 18 (SUTURE) IMPLANT
GAUZE SPONGE 2X2 8PLY STRL LF (GAUZE/BANDAGES/DRESSINGS) IMPLANT
GLOVE ECLIPSE 8.0 STRL XLNG CF (GLOVE) ×3 IMPLANT
GLOVE INDICATOR 8.0 STRL GRN (GLOVE) ×3 IMPLANT
GOWN STRL REUS W/TWL XL LVL3 (GOWN DISPOSABLE) ×6 IMPLANT
IRRIG SUCT STRYKERFLOW 2 WTIP (MISCELLANEOUS) ×3
IRRIGATION SUCT STRKRFLW 2 WTP (MISCELLANEOUS) ×1 IMPLANT
KIT BASIN OR (CUSTOM PROCEDURE TRAY) ×3 IMPLANT
POSITIONER SURGICAL ARM (MISCELLANEOUS) IMPLANT
POUCH RETRIEVAL ECOSAC 10 (ENDOMECHANICALS) IMPLANT
POUCH RETRIEVAL ECOSAC 10MM (ENDOMECHANICALS) ×2
POUCH SPECIMEN RETRIEVAL 10MM (ENDOMECHANICALS) IMPLANT
SCISSORS LAP 5X35 DISP (ENDOMECHANICALS) ×3 IMPLANT
SET CHOLANGIOGRAPH MIX (MISCELLANEOUS) ×3 IMPLANT
SLEEVE XCEL OPT CAN 5 100 (ENDOMECHANICALS) IMPLANT
SPONGE GAUZE 2X2 STER 10/PKG (GAUZE/BANDAGES/DRESSINGS) ×2
SUT MNCRL AB 4-0 PS2 18 (SUTURE) ×3 IMPLANT
SUT PDS AB 1 CT1 27 (SUTURE) ×4 IMPLANT
SYR 20CC LL (SYRINGE) ×3 IMPLANT
TOWEL OR 17X26 10 PK STRL BLUE (TOWEL DISPOSABLE) ×3 IMPLANT
TOWEL OR NON WOVEN STRL DISP B (DISPOSABLE) ×3 IMPLANT
TRAY LAPAROSCOPIC (CUSTOM PROCEDURE TRAY) ×3 IMPLANT
TROCAR BLADELESS OPT 5 100 (ENDOMECHANICALS) ×3 IMPLANT
TROCAR XCEL NON-BLD 11X100MML (ENDOMECHANICALS) ×3 IMPLANT
TUBING INSUF HEATED (TUBING) ×3 IMPLANT

## 2017-01-05 NOTE — Discharge Instructions (Signed)
LAPAROSCOPIC SURGERY: POST OP INSTRUCTIONS ° °###################################################################### ° °EAT °Gradually transition to a high fiber diet with a fiber supplement over the next few weeks after discharge.  Start with a pureed / full liquid diet (see below) ° °WALK °Walk an hour a day.  Control your pain to do that.   ° °CONTROL PAIN °Control pain so that you can walk, sleep, tolerate sneezing/coughing, go up/down stairs. ° °HAVE A BOWEL MOVEMENT DAILY °Keep your bowels regular to avoid problems.  OK to try a laxative to override constipation.  OK to use an antidairrheal to slow down diarrhea.  Call if not better after 2 tries ° °CALL IF YOU HAVE PROBLEMS/CONCERNS °Call if you are still struggling despite following these instructions. °Call if you have concerns not answered by these instructions ° °###################################################################### ° ° ° °1. DIET: Follow a light bland diet the first 24 hours after arrival home, such as soup, liquids, crackers, etc.  Be sure to include lots of fluids daily.  Avoid fast food or heavy meals as your are more likely to get nauseated.  Eat a low fat the next few days after surgery.   °2. Take your usually prescribed home medications unless otherwise directed. °3. PAIN CONTROL: °a. Pain is best controlled by a usual combination of three different methods TOGETHER: °i. Ice/Heat °ii. Over the counter pain medication °iii. Prescription pain medication °b. Most patients will experience some swelling and bruising around the incisions.  Ice packs or heating pads (30-60 minutes up to 6 times a day) will help. Use ice for the first few days to help decrease swelling and bruising, then switch to heat to help relax tight/sore spots and speed recovery.  Some people prefer to use ice alone, heat alone, alternating between ice & heat.  Experiment to what works for you.  Swelling and bruising can take several weeks to resolve.   °c. It is  helpful to take an over-the-counter pain medication regularly for the first few weeks.  Choose one of the following that works best for you: °i. Naproxen (Aleve, etc)  Two 220mg tabs twice a day °ii. Ibuprofen (Advil, etc) Three 200mg tabs four times a day (every meal & bedtime) °iii. Acetaminophen (Tylenol, etc) 500-650mg four times a day (every meal & bedtime) °d. A  prescription for pain medication (such as oxycodone, hydrocodone, etc) should be given to you upon discharge.  Take your pain medication as prescribed.  °i. If you are having problems/concerns with the prescription medicine (does not control pain, nausea, vomiting, rash, itching, etc), please call us [phone] to see if we need to switch you to a different pain medicine that will work better for you and/or control your side effect better. °ii. If you need a refill on your pain medication, please contact your pharmacy.  They will contact our office to request authorization. Prescriptions will not be filled after 5 pm or on week-ends. °4. Avoid getting constipated.  Between the surgery and the pain medications, it is common to experience some constipation.  Increasing fluid intake and taking a fiber supplement (such as Metamucil, Citrucel, FiberCon, MiraLax, etc) 1-2 times a day regularly will usually help prevent this problem from occurring.  A mild laxative (prune juice, Milk of Magnesia, MiraLax, etc) should be taken according to package directions if there are no bowel movements after 48 hours.   °5. Watch out for diarrhea.  If you have many loose bowel movements, simplify your diet to bland foods & liquids for   a few days.  Stop any stool softeners and decrease your fiber supplement.  Switching to mild anti-diarrheal medications (Kayopectate, Pepto Bismol) can help.  If this worsens or does not improve, please call us. °6. Wash / shower every day.  You may shower over the dressings as they are waterproof.  Continue to shower over incision(s)  after the dressing is off. °7. Remove your waterproof bandages 5 days after surgery.  You may leave the incision open to air.  You may replace a dressing/Band-Aid to cover the incision for comfort if you wish.  °8. ACTIVITIES as tolerated:   °a. You may resume regular (light) daily activities beginning the next day--such as daily self-care, walking, climbing stairs--gradually increasing activities as tolerated.  If you can walk 30 minutes without difficulty, it is safe to try more intense activity such as jogging, treadmill, bicycling, low-impact aerobics, swimming, etc. °b. Save the most intensive and strenuous activity for last such as sit-ups, heavy lifting, contact sports, etc  Refrain from any heavy lifting or straining until you are off narcotics for pain control.   °c. DO NOT PUSH THROUGH PAIN.  Let pain be your guide: If it hurts to do something, don't do it.  Pain is your body warning you to avoid that activity for another week until the pain goes down. °d. You may drive when you are no longer taking prescription pain medication, you can comfortably wear a seatbelt, and you can safely maneuver your car and apply brakes. °e. You may have sexual intercourse when it is comfortable.  °9. FOLLOW UP in our office °a. Please call [facility] at [phone] to set up an appointment to see your surgeon in the office for a follow-up appointment approximately 2-3 weeks after your surgery. °b. Make sure that you call for this appointment the day you arrive home to insure a convenient appointment time. °10. IF YOU HAVE DISABILITY OR FAMILY LEAVE FORMS, BRING THEM TO THE OFFICE FOR PROCESSING.  DO NOT GIVE THEM TO YOUR DOCTOR. ° ° °WHEN TO CALL US [phone]: °1. Poor pain control °2. Reactions / problems with new medications (rash/itching, nausea, etc)  °3. Fever over 101.5 F (38.5 C) °4. Inability to urinate °5. Nausea and/or vomiting °6. Worsening swelling or bruising °7. Continued bleeding from incision. °8. Increased  pain, redness, or drainage from the incision ° ° The clinic staff is available to answer your questions during regular business hours (8:30am-5pm).  Please don’t hesitate to call and ask to speak to one of our nurses for clinical concerns.  ° If you have a medical emergency, go to the nearest emergency room or call 911. ° A surgeon from [facility] is always on call at the hospitals ° ° °[facility] °[address], [address], [place], [place]  [address] ? °MAIN: [phone] ? TOLL FREE: [phone] ?  °FAX [phone] °[email or web address] ° ° ° °Cholecystitis °Cholecystitis is inflammation of the gallbladder. It is often called a gallbladder attack. The gallbladder is a pear-shaped organ that lies beneath the liver on the right side of the body. The gallbladder stores bile, which is a fluid that helps the body to digest fats. If bile builds up in your gallbladder, your gallbladder becomes inflamed. This condition may occur suddenly (be acute). Repeat episodes of acute cholecystitis or prolonged episodes may lead to a long-term (chronic) condition. Cholecystitis is serious and it requires treatment. °What are the causes? °The most common cause of this   condition is gallstones. Gallstones can block the tube (duct) that carries bile out of your gallbladder. This causes bile to build up. Other causes of this condition include: °· Damage to the gallbladder due to a decrease in blood flow. °· Infections in the bile ducts. °· Scars or kinks in the bile ducts. °· Tumors in the liver, pancreas, or gallbladder. ° °What increases the risk? °This condition is more likely to develop in: °· People who have sickle cell disease. °· People who take birth control pills or use estrogen. °· People who have alcoholic liver disease. °· People who have liver cirrhosis. °· People who have their nutrition delivered through a vein (parenteral nutrition). °· People who do not eat or drink  (do fasting) for a long period of time. °· People who are obese. °· People who have rapid weight loss. °· People who are pregnant. °· People who have increased triglyceride levels. °· People who have pancreatitis. ° °What are the signs or symptoms? °Symptoms of this condition include: °· Abdominal pain, especially in the upper right area of the abdomen. °· Abdominal tenderness or bloating. °· Nausea. °· Vomiting. °· Fever. °· Chills. °· Yellowing of the skin and the whites of the eyes (jaundice). ° °How is this diagnosed? °This condition is diagnosed with a medical history and physical exam. You may also have other tests, including: °· Imaging tests, such as: °? An ultrasound of the gallbladder. °? A CT scan of the abdomen. °? A gallbladder nuclear scan (HIDA scan). This scan allows your health care provider to see the bile moving from your liver to your gallbladder and to your small intestine. °? MRI. °· Blood tests, such as: °? A complete blood count, because the white blood cell count may be higher than normal. °? Liver function tests, because some levels may be higher than normal with certain types of gallstones. ° °How is this treated? °Treatment may include: °· Fasting for a certain amount of time. °· IV fluids. °· Medicine to treat pain or vomiting. °· Antibiotic medicine. °· Surgery to remove your gallbladder (cholecystectomy). This may happen immediately or at a later time. ° °Follow these instructions at home: °Home care will depend on your treatment. In general: °· Take over-the-counter and prescription medicines only as told by your health care provider. °· If you were prescribed an antibiotic medicine, take it as told by your health care provider. Do not stop taking the antibiotic even if you start to feel better. °· Follow instructions from your health care provider about what to eat or drink. When you are allowed to eat, avoid eating or drinking anything that triggers your symptoms. °· Keep all  follow-up visits as told by your health care provider. This is important. ° °Contact a health care provider if: °· Your pain is not controlled with medicine. °· You have a fever. °Get help right away if: °· Your pain moves to another part of your abdomen or to your back. °· You continue to have symptoms or you develop new symptoms even with treatment. °This information is not intended to replace advice given to you by your health care provider. Make sure you discuss any questions you have with your health care provider. °Document Released: 04/21/2005 Document Revised: 08/30/2015 Document Reviewed: 08/02/2014 °[employer] Interactive Patient Education © 2017 [employer] ° °

## 2017-01-05 NOTE — Interval H&P Note (Signed)
History and Physical Interval Note:  01/05/2017 12:03 PM  Christina Valenzuela  has presented today for surgery, with the diagnosis of Cholelithasis  The various methods of treatment have been discussed with the patient and family. After consideration of risks, benefits and other options for treatment, the patient has consented to  Procedure(s): LAPAROSCOPIC CHOLECYSTECTOMY WITH INTRAOPERATIVE CHOLANGIOGRAM (N/A) as a surgical intervention .  The patient's history has been reviewed, patient examined, no change in status, stable for surgery.  I have reviewed the patient's chart and labs.  Questions were answered to the patient's satisfaction.    I have re-reviewed the the patient's records, history, medications, and allergies.  I have re-examined the patient.  I again discussed intraoperative plans and goals of post-operative recovery.  We will have type and screen done given her severe anemia, six weeks status post hysterectomy for uterine leiomyoma with significant menorrhagia.  Because of her prior hernia repair, she is not candidate for single site approach.  We will do standard four-port.  May have prolonged case with lysis of adhesions.  Good transfusion with hemoglobin in the sevens, but hopefully we will try to minimize intraoperative bleeding.  The patient agrees to proceed.  Christina Valenzuela  [birth year] [id number]  Patient Care Team: Smothers, Andree Elk, NP as PCP - General (Nurse Practitioner)  Patient Active Problem List   Diagnosis Date Noted  . Cholecystitis with cholelithiasis 01/05/2017  . Postoperative anemia from TAH 11/2016 01/05/2017  . Iron deficiency anemia due to chronic blood loss 10/24/2016  . Mixed incontinence 10/24/2016  . Chronic anemia 09/08/2015  . Pica 09/08/2015  . Primary insomnia 08/22/2015  . Depression with anxiety 08/22/2015  . Alcohol abuse 08/22/2015  . Headache 08/22/2015  . BV (bacterial vaginosis) 02/16/2014  . History of uterine leiomyoma s/p TAH 11/28/2016  02/16/2014  . UTI symptoms 02/16/2014  . Leiomyoma of uterus, unspecified 08/25/2013  . Dysmenorrhea 08/25/2013  . Vaginitis and vulvovaginitis, unspecified 08/25/2013  . 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    Past Medical History:  Diagnosis Date  . Depression     Past Surgical History:  Procedure Laterality Date  . ABDOMINAL HYSTERECTOMY  11/28/2016   [facility] - Dr Zigmund Daniel.  UTERINE LEIOMYOMA w/ MENORRHAGIA  . CESAREAN SECTION    . CESAREAN SECTION    . EPIGASTRIC HERNIA REPAIR     with mesh.  Dr Excell Seltzer  . TUBAL LIGATION      Social History   Social History  . Marital status: Married    Spouse name: N/A  . Number of children: 5  . Years of education: 12   Occupational History  . Not on file.   Social History Main Topics  . Smoking status: Current Every Day Smoker    Packs/day: 0.25    Types: Cigarettes  . Smokeless tobacco: Never Used  . Alcohol use No  . Drug use: No  . Sexual activity: Not Currently    Partners: Male    Birth control/ protection: Surgical     Comment: Tubal Ligation    Other Topics Concern  . Not on file   Social History Narrative   Drinks caffeine     Family History  Problem Relation Age of Onset  . Diabetes Maternal Grandfather     Current Facility-Administered Medications  Medication Dose Route Frequency Provider Last Rate Last Dose  . cefTRIAXone (ROCEPHIN) 2 g in dextrose 5 % 50 mL IVPB  2 g Intravenous Q24H Jovita Kussmaul, MD   Stopped at 01/05/17  8891  . cefTRIAXone (ROCEPHIN) 2 g in dextrose 5 % 50 mL IVPB  2 g Intravenous On Call to OR Michael Boston, MD       And  . metroNIDAZOLE (FLAGYL) IVPB 500 mg  500 mg Intravenous Tor Netters, MD      . Chlorhexidine Gluconate Cloth 2 % PADS 6 each  6 each Topical Once Michael Boston, MD      . dextrose 5 % and 0.9 % NaCl with KCl 20 mEq/L infusion   Intravenous Continuous Autumn Messing III, MD 100 mL/hr at 01/05/17 0343    . lip balm (CARMEX) ointment           . morphine 2 MG/ML  injection 1-4 mg  1-4 mg Intravenous Q1H PRN Autumn Messing III, MD   2 mg at 01/05/17 1016  . ondansetron (ZOFRAN-ODT) disintegrating tablet 4 mg  4 mg Oral Q6H PRN Autumn Messing III, MD       Or  . ondansetron [facility]) injection 4 mg  4 mg Intravenous Q6H PRN Autumn Messing III, MD      . pantoprazole (PROTONIX) injection 40 mg  40 mg Intravenous QHS Autumn Messing III, MD      . valACYclovir (VALTREX) tablet 500 mg  500 mg Oral BID Jovita Kussmaul, MD   Stopped at 01/05/17 1000     Allergies  Allergen Reactions  . Hydrocodone-Acetaminophen Itching  . Latex Swelling    Vaginal swelling    BP 136/80 (BP Location: Left Arm)   Pulse 94   Temp 98.3 F (36.8 C) (Oral)   Resp 18   Ht 5' 2"  (1.575 m)   Wt 81.6 kg (180 lb)   LMP 08/31/2016   SpO2 100%   BMI 32.92 kg/m   Labs: Results for orders placed or performed during the hospital encounter of 01/04/17 (from the past 48 hour(s))  Lipase, blood     Status: None   Collection Time: 01/04/17  5:30 PM  Result Value Ref Range   Lipase 27 11 - 51 U/L  Comprehensive metabolic panel     Status: Abnormal   Collection Time: 01/04/17  5:30 PM  Result Value Ref Range   Sodium 135 135 - 145 mmol/L   Potassium 3.7 3.5 - 5.1 mmol/L   Chloride 105 101 - 111 mmol/L   CO2 20 (L) 22 - 32 mmol/L   Glucose, Bld 95 65 - 99 mg/dL   BUN 7 6 - 20 mg/dL   Creatinine, Ser 0.75 0.44 - 1.00 mg/dL   Calcium 9.8 8.9 - 10.3 mg/dL   Total Protein 8.5 (H) 6.5 - 8.1 g/dL   Albumin 4.2 3.5 - 5.0 g/dL   AST 127 (H) 15 - 41 U/L   ALT 222 (H) 14 - 54 U/L   Alkaline Phosphatase 124 38 - 126 U/L   Total Bilirubin 1.2 0.3 - 1.2 mg/dL   GFR calc non Af Amer >60 >60 mL/min   GFR calc Af Amer >60 >60 mL/min    Comment: (NOTE) The eGFR has been calculated using the CKD EPI equation. This calculation has not been validated in all clinical situations. eGFR's persistently <60 mL/min signify possible Chronic Kidney Disease.    Anion gap 10 5 - 15  CBC     Status: Abnormal    Collection Time: 01/04/17  5:30 PM  Result Value Ref Range   WBC 8.2 4.0 - 10.5 K/uL   RBC 5.00 3.87 - 5.11 MIL/uL  Hemoglobin 8.0 (L) 12.0 - 15.0 g/dL   HCT 28.9 (L) 36.0 - 46.0 %   MCV 57.8 (L) 78.0 - 100.0 fL   MCH 16.0 (L) 26.0 - 34.0 pg   MCHC 27.7 (L) 30.0 - 36.0 g/dL   RDW 22.4 (H) 11.5 - 15.5 %   Platelets 584 (H) 150 - 400 K/uL  Urinalysis, Routine w reflex microscopic     Status: Abnormal   Collection Time: 01/04/17  5:30 PM  Result Value Ref Range   Color, Urine YELLOW YELLOW   APPearance HAZY (A) CLEAR   Specific Gravity, Urine >1.030 (H) 1.005 - 1.030   pH 5.5 5.0 - 8.0   Glucose, UA NEGATIVE NEGATIVE mg/dL   Hgb urine dipstick NEGATIVE NEGATIVE   Bilirubin Urine SMALL (A) NEGATIVE   Ketones, ur 15 (A) NEGATIVE mg/dL   Protein, ur NEGATIVE NEGATIVE mg/dL   Nitrite NEGATIVE NEGATIVE   Leukocytes, UA SMALL (A) NEGATIVE  Urinalysis, Microscopic (reflex)     Status: Abnormal   Collection Time: 01/04/17  5:30 PM  Result Value Ref Range   RBC / HPF NONE SEEN 0 - 5 RBC/hpf   WBC, UA 0-5 0 - 5 WBC/hpf   Bacteria, UA RARE (A) NONE SEEN   Squamous Epithelial / LPF 6-30 (A) NONE SEEN   Urine-Other CA OXALATE CRYSTALS     Comment: MUCOUS PRESENT  Comprehensive metabolic panel     Status: Abnormal   Collection Time: 01/05/17  4:29 AM  Result Value Ref Range   Sodium 136 135 - 145 mmol/L   Potassium 3.5 3.5 - 5.1 mmol/L   Chloride 106 101 - 111 mmol/L   CO2 22 22 - 32 mmol/L   Glucose, Bld 98 65 - 99 mg/dL   BUN 9 6 - 20 mg/dL   Creatinine, Ser 0.77 0.44 - 1.00 mg/dL   Calcium 9.4 8.9 - 10.3 mg/dL   Total Protein 7.8 6.5 - 8.1 g/dL   Albumin 3.7 3.5 - 5.0 g/dL   AST 99 (H) 15 - 41 U/L   ALT 186 (H) 14 - 54 U/L   Alkaline Phosphatase 103 38 - 126 U/L   Total Bilirubin 1.0 0.3 - 1.2 mg/dL   GFR calc non Af Amer >60 >60 mL/min   GFR calc Af Amer >60 >60 mL/min    Comment: (NOTE) The eGFR has been calculated using the CKD EPI equation. This calculation has not been  validated in all clinical situations. eGFR's persistently <60 mL/min signify possible Chronic Kidney Disease.    Anion gap 8 5 - 15  CBC     Status: Abnormal   Collection Time: 01/05/17  4:29 AM  Result Value Ref Range   WBC 8.7 4.0 - 10.5 K/uL   RBC 4.54 3.87 - 5.11 MIL/uL   Hemoglobin 7.2 (L) 12.0 - 15.0 g/dL   HCT 26.6 (L) 36.0 - 46.0 %   MCV 58.6 (L) 78.0 - 100.0 fL   MCH 15.9 (L) 26.0 - 34.0 pg   MCHC 27.1 (L) 30.0 - 36.0 g/dL   RDW 20.5 (H) 11.5 - 15.5 %   Platelets 480 (H) 150 - 400 K/uL    Imaging / Studies: Ct Abdomen Pelvis W Contrast  Result Date: 01/04/2017 CLINICAL DATA:  Epigastric pain for 4 days. Patient status post hysterectomy August 2nd 2018. EXAM: CT ABDOMEN AND PELVIS WITH CONTRAST TECHNIQUE: Multidetector CT imaging of the abdomen and pelvis was performed using the standard protocol following bolus administration of intravenous contrast.  CONTRAST:  120m ISOVUE-300 IOPAMIDOL (ISOVUE-300) INJECTION 61% COMPARISON:  Sep 12, 2016 FINDINGS: Lower chest: Mild dependent atelectasis of both lung bases are noted. The heart size is normal. Hepatobiliary: The liver is normal without focal liver lesion. There is gallbladder wall thickening with pericholecystic fluid and inflammation. Possible gallstone or sludge noted. The biliary tree is normal. Pancreas: Unremarkable. No pancreatic ductal dilatation or surrounding inflammatory changes. Spleen: No acute abnormalities identified in the spleen. Adrenals/Urinary Tract: Adrenal glands are unremarkable. Kidneys are normal, without renal calculi, focal lesion, or hydronephrosis. Bladder is unremarkable. Stomach/Bowel: The stomach is normal. There is no small bowel obstruction. The appendix is normal. There is mild bowel wall thickening of the sigmoid colon and rectum. This may be postsurgical inflammation/change. Vascular/Lymphatic: No significant vascular findings are present. No enlarged abdominal or pelvic lymph nodes. Reproductive:  Patient status post prior hysterectomy. There is mild inflammation and stranding near the cervix likely postsurgical change. Other: None. Musculoskeletal: No acute or significant osseous findings. IMPRESSION: Findings suspicious for cholecystitis. Patient status post prior hysterectomy. There is mild inflammation and stranding near the cervix likely postsurgical change. There is mild bowel wall thickening of the sigmoid colon and rectum. This may be postsurgical inflammation/change. Consider follow-up CT of the pelvis in 2-3 months to ensure resolution and to ensure no persistent bowel wall thickening is present. Electronically Signed   By: WAbelardo DieselM.D.   On: 01/04/2017 20:07   UKoreaAbdomen Limited Ruq  Result Date: 01/05/2017 CLINICAL DATA:  Right upper quadrant pain. EXAM: ULTRASOUND ABDOMEN LIMITED RIGHT UPPER QUADRANT COMPARISON:  CT 01/04/2017 FINDINGS: Gallbladder: Multiple gallbladder calculi measuring up to 13 mm. Moderate gallbladder mural thickening, over 6 mm. Edema within the gallbladder wall. Common bile duct: Diameter: Normal, 3.2 mm. Liver: No focal lesion identified. Within normal limits in parenchymal echogenicity. Portal vein is patent on color Doppler imaging with normal direction of blood flow towards the liver. IMPRESSION: Cholelithiasis with moderate gallbladder mural thickening/ edema. Findings are concerning for possible cholecystitis. Electronically Signed   By: DAndreas NewportM.D.   On: 01/05/2017 01:16     .SAdin Hector M.D., F.A.C.S. [facility] [address] [place] [phone] / Paging  01/05/2017 12:03 PM    Dinero Chavira C.

## 2017-01-05 NOTE — Progress Notes (Signed)
Subjective/Chief Complaint: Feels ok   Objective: Vital signs in last 24 hours: Temp:  [97.9 F (36.6 C)-99.4 F (37.4 C)] 98.3 F (36.8 C) (09/03 0621) Pulse Rate:  [83-105] 94 (09/03 0621) Resp:  [16-20] 18 (09/03 0621) BP: (101-136)/(76-107) 136/80 (09/03 0621) SpO2:  [98 %-100 %] 100 % (09/03 0621) Weight:  [81.6 kg (180 lb)] 81.6 kg (180 lb) (09/02 1636) Last BM Date: 01/04/17  Intake/Output from previous day: 09/02 0701 - 09/03 0700 In: 1000 [IV Piggyback:1000] Out: -  Intake/Output this shift: No intake/output data recorded.  General appearance: alert and cooperative Resp: clear to auscultation bilaterally Cardio: regular rate and rhythm GI: soft, mild RUQ tenderness  Lab Results:   Recent Labs  01/04/17 1730 01/05/17 0429  WBC 8.2 8.7  HGB 8.0* 7.2*  HCT 28.9* 26.6*  PLT 584* 480*   BMET  Recent Labs  01/04/17 1730 01/05/17 0429  NA 135 136  K 3.7 3.5  CL 105 106  CO2 20* 22  GLUCOSE 95 98  BUN 7 9  CREATININE 0.75 0.77  CALCIUM 9.8 9.4   PT/INR No results for input(s): LABPROT, INR in the last 72 hours. ABG No results for input(s): PHART, HCO3 in the last 72 hours.  Invalid input(s): PCO2, PO2  Studies/Results: Ct Abdomen Pelvis W Contrast  Result Date: 01/04/2017 CLINICAL DATA:  Epigastric pain for 4 days. Patient status post hysterectomy August 2nd 2018. EXAM: CT ABDOMEN AND PELVIS WITH CONTRAST TECHNIQUE: Multidetector CT imaging of the abdomen and pelvis was performed using the standard protocol following bolus administration of intravenous contrast. CONTRAST:  136mL ISOVUE-300 IOPAMIDOL (ISOVUE-300) INJECTION 61% COMPARISON:  Sep 12, 2016 FINDINGS: Lower chest: Mild dependent atelectasis of both lung bases are noted. The heart size is normal. Hepatobiliary: The liver is normal without focal liver lesion. There is gallbladder wall thickening with pericholecystic fluid and inflammation. Possible gallstone or sludge noted. The biliary  tree is normal. Pancreas: Unremarkable. No pancreatic ductal dilatation or surrounding inflammatory changes. Spleen: No acute abnormalities identified in the spleen. Adrenals/Urinary Tract: Adrenal glands are unremarkable. Kidneys are normal, without renal calculi, focal lesion, or hydronephrosis. Bladder is unremarkable. Stomach/Bowel: The stomach is normal. There is no small bowel obstruction. The appendix is normal. There is mild bowel wall thickening of the sigmoid colon and rectum. This may be postsurgical inflammation/change. Vascular/Lymphatic: No significant vascular findings are present. No enlarged abdominal or pelvic lymph nodes. Reproductive: Patient status post prior hysterectomy. There is mild inflammation and stranding near the cervix likely postsurgical change. Other: None. Musculoskeletal: No acute or significant osseous findings. IMPRESSION: Findings suspicious for cholecystitis. Patient status post prior hysterectomy. There is mild inflammation and stranding near the cervix likely postsurgical change. There is mild bowel wall thickening of the sigmoid colon and rectum. This may be postsurgical inflammation/change. Consider follow-up CT of the pelvis in 2-3 months to ensure resolution and to ensure no persistent bowel wall thickening is present. Electronically Signed   By: Abelardo Diesel M.D.   On: 01/04/2017 20:07   US Abdomen Limited Ruq  Result Date: 01/05/2017 CLINICAL DATA:  Right upper quadrant pain. EXAM: ULTRASOUND ABDOMEN LIMITED RIGHT UPPER QUADRANT COMPARISON:  CT 01/04/2017 FINDINGS: Gallbladder: Multiple gallbladder calculi measuring up to 13 mm. Moderate gallbladder mural thickening, over 6 mm. Edema within the gallbladder wall. Common bile duct: Diameter: Normal, 3.2 mm. Liver: No focal lesion identified. Within normal limits in parenchymal echogenicity. Portal vein is patent on color Doppler imaging with normal direction of blood flow  towards the liver. IMPRESSION: Cholelithiasis  with moderate gallbladder mural thickening/ edema. Findings are concerning for possible cholecystitis. Electronically Signed   By: Andreas Newport M.D.   On: 01/05/2017 01:16    Anti-infectives: Anti-infectives    Start     Dose/Rate Route Frequency Ordered Stop   01/05/17 0400  cefTRIAXone (ROCEPHIN) 2 g in dextrose 5 % 50 mL IVPB     2 g 100 mL/hr over 30 Minutes Intravenous Every 24 hours 01/05/17 0313     01/05/17 0315  valACYclovir (VALTREX) tablet 500 mg     500 mg Oral 2 times daily 01/05/17 0313        Assessment/Plan: s/p Procedure(s): LAPAROSCOPIC CHOLECYSTECTOMY WITH INTRAOPERATIVE CHOLANGIOGRAM (N/A) Plan for lap chole with ioc today. Risks and benefits of the surgery discussed with patient and she understands and wishes to proceed  LOS: 0 days    TOTH III,PAUL S 01/05/2017

## 2017-01-05 NOTE — ED Notes (Signed)
US at bedside

## 2017-01-05 NOTE — H&P (Signed)
Christina Valenzuela is an 41 y.o. female.   Chief Complaint: vomiting HPI:  The patient is a 41 year old black female who has been experiencing epigastric pain for the last 4 days. She states that the pain seems to come and go. The pain is been associated with nausea and vomiting. In retrospect the pain has been going on for the last year. She went to the emergency department where a CT and ultrasound showed stones in the gallbladder was some gallbladder wall thickening. She is one-month status post hysterectomy  Past Medical History:  Diagnosis Date  . Depression     Past Surgical History:  Procedure Laterality Date  . CESAREAN SECTION    . CESAREAN SECTION    . HERNIA REPAIR    . TUBAL LIGATION      Family History  Problem Relation Age of Onset  . Diabetes Maternal Grandfather    Social History:  reports that she has been smoking Cigarettes.  She has been smoking about 0.25 packs per day. She has never used smokeless tobacco. She reports that she does not drink alcohol or use drugs.  Allergies:  Allergies  Allergen Reactions  . Hydrocodone-Acetaminophen Itching  . Latex Swelling    Vaginal swelling     (Not in a hospital admission)  Results for orders placed or performed during the hospital encounter of 01/04/17 (from the past 48 hour(s))  Lipase, blood     Status: None   Collection Time: 01/04/17  5:30 PM  Result Value Ref Range   Lipase 27 11 - 51 U/L  Comprehensive metabolic panel     Status: Abnormal   Collection Time: 01/04/17  5:30 PM  Result Value Ref Range   Sodium 135 135 - 145 mmol/L   Potassium 3.7 3.5 - 5.1 mmol/L   Chloride 105 101 - 111 mmol/L   CO2 20 (L) 22 - 32 mmol/L   Glucose, Bld 95 65 - 99 mg/dL   BUN 7 6 - 20 mg/dL   Creatinine, Ser 0.75 0.44 - 1.00 mg/dL   Calcium 9.8 8.9 - 10.3 mg/dL   Total Protein 8.5 (H) 6.5 - 8.1 g/dL   Albumin 4.2 3.5 - 5.0 g/dL   AST 127 (H) 15 - 41 U/L   ALT 222 (H) 14 - 54 U/L   Alkaline Phosphatase 124 38 - 126 U/L    Total Bilirubin 1.2 0.3 - 1.2 mg/dL   GFR calc non Af Amer >60 >60 mL/min   GFR calc Af Amer >60 >60 mL/min    Comment: (NOTE) The eGFR has been calculated using the CKD EPI equation. This calculation has not been validated in all clinical situations. eGFR's persistently <60 mL/min signify possible Chronic Kidney Disease.    Anion gap 10 5 - 15  CBC     Status: Abnormal   Collection Time: 01/04/17  5:30 PM  Result Value Ref Range   WBC 8.2 4.0 - 10.5 K/uL   RBC 5.00 3.87 - 5.11 MIL/uL   Hemoglobin 8.0 (L) 12.0 - 15.0 g/dL   HCT 28.9 (L) 36.0 - 46.0 %   MCV 57.8 (L) 78.0 - 100.0 fL   MCH 16.0 (L) 26.0 - 34.0 pg   MCHC 27.7 (L) 30.0 - 36.0 g/dL   RDW 22.4 (H) 11.5 - 15.5 %   Platelets 584 (H) 150 - 400 K/uL  Urinalysis, Routine w reflex microscopic     Status: Abnormal   Collection Time: 01/04/17  5:30 PM  Result Value Ref  Range   Color, Urine YELLOW YELLOW   APPearance HAZY (A) CLEAR   Specific Gravity, Urine >1.030 (H) 1.005 - 1.030   pH 5.5 5.0 - 8.0   Glucose, UA NEGATIVE NEGATIVE mg/dL   Hgb urine dipstick NEGATIVE NEGATIVE   Bilirubin Urine SMALL (A) NEGATIVE   Ketones, ur 15 (A) NEGATIVE mg/dL   Protein, ur NEGATIVE NEGATIVE mg/dL   Nitrite NEGATIVE NEGATIVE   Leukocytes, UA SMALL (A) NEGATIVE  Urinalysis, Microscopic (reflex)     Status: Abnormal   Collection Time: 01/04/17  5:30 PM  Result Value Ref Range   RBC / HPF NONE SEEN 0 - 5 RBC/hpf   WBC, UA 0-5 0 - 5 WBC/hpf   Bacteria, UA RARE (A) NONE SEEN   Squamous Epithelial / LPF 6-30 (A) NONE SEEN   Urine-Other CA OXALATE CRYSTALS     Comment: MUCOUS PRESENT   Ct Abdomen Pelvis W Contrast  Result Date: 01/04/2017 CLINICAL DATA:  Epigastric pain for 4 days. Patient status post hysterectomy August 2nd 2018. EXAM: CT ABDOMEN AND PELVIS WITH CONTRAST TECHNIQUE: Multidetector CT imaging of the abdomen and pelvis was performed using the standard protocol following bolus administration of intravenous contrast.  CONTRAST:  174m ISOVUE-300 IOPAMIDOL (ISOVUE-300) INJECTION 61% COMPARISON:  Sep 12, 2016 FINDINGS: Lower chest: Mild dependent atelectasis of both lung bases are noted. The heart size is normal. Hepatobiliary: The liver is normal without focal liver lesion. There is gallbladder wall thickening with pericholecystic fluid and inflammation. Possible gallstone or sludge noted. The biliary tree is normal. Pancreas: Unremarkable. No pancreatic ductal dilatation or surrounding inflammatory changes. Spleen: No acute abnormalities identified in the spleen. Adrenals/Urinary Tract: Adrenal glands are unremarkable. Kidneys are normal, without renal calculi, focal lesion, or hydronephrosis. Bladder is unremarkable. Stomach/Bowel: The stomach is normal. There is no small bowel obstruction. The appendix is normal. There is mild bowel wall thickening of the sigmoid colon and rectum. This may be postsurgical inflammation/change. Vascular/Lymphatic: No significant vascular findings are present. No enlarged abdominal or pelvic lymph nodes. Reproductive: Patient status post prior hysterectomy. There is mild inflammation and stranding near the cervix likely postsurgical change. Other: None. Musculoskeletal: No acute or significant osseous findings. IMPRESSION: Findings suspicious for cholecystitis. Patient status post prior hysterectomy. There is mild inflammation and stranding near the cervix likely postsurgical change. There is mild bowel wall thickening of the sigmoid colon and rectum. This may be postsurgical inflammation/change. Consider follow-up CT of the pelvis in 2-3 months to ensure resolution and to ensure no persistent bowel wall thickening is present. Electronically Signed   By: WAbelardo DieselM.D.   On: 01/04/2017 20:07   UK[place]Abdomen Limited Ruq  Result Date: 01/05/2017 CLINICAL DATA:  Right upper quadrant pain. EXAM: ULTRASOUND ABDOMEN LIMITED RIGHT UPPER QUADRANT COMPARISON:  CT 01/04/2017 FINDINGS: Gallbladder:  Multiple gallbladder calculi measuring up to 13 mm. Moderate gallbladder mural thickening, over 6 mm. Edema within the gallbladder wall. Common bile duct: Diameter: Normal, 3.2 mm. Liver: No focal lesion identified. Within normal limits in parenchymal echogenicity. Portal vein is patent on color Doppler imaging with normal direction of blood flow towards the liver. IMPRESSION: Cholelithiasis with moderate gallbladder mural thickening/ edema. Findings are concerning for possible cholecystitis. Electronically Signed   By: DAndreas NewportM.D.   On: 01/05/2017 01:16    Review of Systems  Constitutional: Negative.   HENT: Negative.   Eyes: Negative.   Respiratory: Negative.   Cardiovascular: Negative.   Gastrointestinal: Positive for abdominal pain, nausea and vomiting.  Genitourinary: Negative.   Musculoskeletal: Negative.   Skin: Negative.   Neurological: Negative.   Endo/Heme/Allergies: Negative.   Psychiatric/Behavioral: Negative.     Blood pressure 126/89, pulse (!) 104, temperature 98.4 F (36.9 C), temperature source Oral, resp. rate 18, height 5' 2"  (1.575 m), weight 81.6 kg (180 lb), last menstrual period 08/31/2016, SpO2 99 %. Physical Exam  Constitutional: She is oriented to person, place, and time. She appears well-developed and well-nourished. No distress.  HENT:  Head: Normocephalic and atraumatic.  Mouth/Throat: No oropharyngeal exudate.  Eyes: Pupils are equal, round, and reactive to light. Conjunctivae and EOM are normal.  Neck: Normal range of motion. Neck supple.  Cardiovascular: Normal rate, regular rhythm and normal heart sounds.   Respiratory: Effort normal and breath sounds normal. No stridor. No respiratory distress.  GI: Soft. Bowel sounds are normal. There is tenderness.  Musculoskeletal: Normal range of motion. She exhibits no edema or tenderness.  Neurological: She is alert and oriented to person, place, and time. Coordination normal.  Skin: Skin is warm and  dry. No erythema.  Psychiatric: She has a normal mood and affect. Her behavior is normal. Thought content normal.     Assessment/Plan  the patient appears to have cholecystitis with cholelithiasis. Because of the risks of further painful episodes and possible pancreatitis or think she would benefit from having her gallbladder removed. She would also like to this done. I discussed with her in detail the risks and benefits of the operation to remove the gallbladder as well as some of the technical aspects and she understands and wishes to proceed. We will plan to admit her for bowel rest and broad-spectrum antibiotic therapy. We will plan for this possibly tomorrow if her liver functions improve.  Merrie Roof, MD 01/05/2017, 1:58 AM

## 2017-01-05 NOTE — Anesthesia Procedure Notes (Signed)
Procedure Name: Intubation Date/Time: 01/05/2017 12:57 PM Performed by: Carleene Cooper A Pre-anesthesia Checklist: Patient identified, Emergency Drugs available, Suction available, Patient being monitored and Timeout performed Patient Re-evaluated:Patient Re-evaluated prior to induction Oxygen Delivery Method: Circle system utilized Preoxygenation: Pre-oxygenation with 100% oxygen Induction Type: IV induction Ventilation: Mask ventilation without difficulty Laryngoscope Size: Mac and 4 Grade View: Grade I Tube type: Oral Tube size: 7.5 mm Number of attempts: 1 Airway Equipment and Method: Stylet Placement Confirmation: ETT inserted through vocal cords under direct vision,  positive ETCO2 and breath sounds checked- equal and bilateral Secured at: 22 cm Tube secured with: Tape Dental Injury: Teeth and Oropharynx as per pre-operative assessment

## 2017-01-05 NOTE — Anesthesia Preprocedure Evaluation (Signed)
Anesthesia Evaluation  Patient identified by MRN, date of birth, ID band Patient awake    Reviewed: Allergy & Precautions, H&P , NPO status , Patient's Chart, lab work & pertinent test results  Airway Mallampati: II   Neck ROM: full    Dental   Pulmonary Current Smoker,    breath sounds clear to auscultation       Cardiovascular negative cardio ROS   Rhythm:regular Rate:Normal     Neuro/Psych PSYCHIATRIC DISORDERS Depression    GI/Hepatic   Endo/Other    Renal/GU      Musculoskeletal   Abdominal   Peds  Hematology  (+) anemia ,   Anesthesia Other Findings   Reproductive/Obstetrics Recent hysterectomy                             Anesthesia Physical Anesthesia Plan  ASA: II  Anesthesia Plan: General   Post-op Pain Management:    Induction: Intravenous  PONV Risk Score and Plan: 3 and Ondansetron, Dexamethasone, Midazolam and Treatment may vary due to age or medical condition  Airway Management Planned: Oral ETT  Additional Equipment:   Intra-op Plan:   Post-operative Plan: Extubation in OR  Informed Consent: I have reviewed the patients History and Physical, chart, labs and discussed the procedure including the risks, benefits and alternatives for the proposed anesthesia with the patient or authorized representative who has indicated his/her understanding and acceptance.     Plan Discussed with: CRNA, Anesthesiologist and Surgeon  Anesthesia Plan Comments:         Anesthesia Quick Evaluation

## 2017-01-05 NOTE — Transfer of Care (Signed)
Immediate Anesthesia Transfer of Care Note  Patient: Christina Valenzuela  Procedure(s) Performed: Procedure(s): LAPAROSCOPIC CHOLECYSTECTOMY WITH INTRAOPERATIVE CHOLANGIOGRAM (N/A)  Patient Location: PACU  Anesthesia Type:General  Level of Consciousness: awake, alert , oriented and patient cooperative  Airway & Oxygen Therapy: Patient Spontanous Breathing and Patient connected to face mask oxygen  Post-op Assessment: Report given to RN, Post -op Vital signs reviewed and stable and Patient moving all extremities  Post vital signs: Reviewed and stable  Last Vitals:  Vitals:   01/05/17 0315 01/05/17 0621  BP: 128/76 136/80  Pulse: 91 94  Resp: 18 18  Temp: 36.6 C 36.8 C  SpO2: 100% 100%    Last Pain:  Vitals:   01/05/17 1046  TempSrc:   PainSc: 2       Patients Stated Pain Goal: 4 (78/41/28 2081)  Complications: No apparent anesthesia complications

## 2017-01-05 NOTE — Op Note (Signed)
01/05/2017  PATIENT:  Christina Valenzuela  41 y.o. female  Patient Care Team: Smothers, Andree Elk, NP as PCP - General (Nurse Practitioner)  PRE-OPERATIVE DIAGNOSIS:    Acute on Chronic Calculus Cholecystitis  POST-OPERATIVE DIAGNOSIS:   Acute on Chronic Calculus Cholecystitis  Liver: normal  PROCEDURE:  Laparoscopic cholecystectomy with intraoperative cholangiogram  SURGEON:  Adin Hector, MD, FACS.  ASSISTANT: Lorine Bears, MD   ANESTHESIA:    General with endotracheal intubation Local anesthetic as a field block  EBL:  (See Anesthesia Intraoperative Record) Total I/O In: 250 [IV Piggyback:250] Out: 25 [Blood:25]   Delay start of Pharmacological VTE agent (>24hrs) due to surgical blood loss or risk of bleeding:  no  DRAINS: None   SPECIMEN: Gallbladder    DISPOSITION OF SPECIMEN:  PATHOLOGY  COUNTS:  YES  PLAN OF CARE: Admit to inpatient   PATIENT DISPOSITION:  PACU - hemodynamically stable.  INDICATION: Patient with symptoms of biliary colic with more constant pain suspicious for cholecystitis.  Recommendation made for cholecystectomy.  Given her prior abdominal surgeries including mesh repair of hernia, not a good cansingle site a  The anatomy & physiology of hepatobiliary & pancreatic function was discussed.  The pathophysiology of gallbladder dysfunction was discussed.  Natural history risks without surgery was discussed.   I feel the risks of no intervention will lead to serious problems that outweigh the operative risks; therefore, I recommended cholecystectomy to remove the pathology.  I explained laparoscopic techniques with possible need for an open approach.  Probable cholangiogram to evaluate the bilary tract was explained as well.    Risks such as bleeding, infection, abscess, leak, injury to other organs, need for further treatment, heart attack, death, and other risks were discussed.  I noted a good likelihood this will help address the problem.  Possibility  that this will not correct all abdominal symptoms was explained.  Goals of post-operative recovery were discussed as well.  We will work to minimize complications.  An educational handout further explaining the pathology and treatment options was given as well.  Questions were answered.  The patient expresses understanding & wishes to proceed with surgery.  OR FINDINGS: thickened edematous gallbladder consistent with acute cholecystitis. Chronic adhesions and chronic changes consistent with chronic cholecystitis as well.  Thickened edematous cystic duct.  Clamp gram without any evidence of obstruction or leak or other abnormality.  Liver: normal  DESCRIPTION:   Informed consent was confirmed.  The patient underwent general anaesthesia without difficulty.  The patient was positioned appropriately.  VTE prevention in place.  The patient's abdomen was clipped, prepped, & draped in a sterile fashion.  Surgical timeout confirmed our plan.  Peritoneal entry with a laparoscopic port was obtained using optical entry technique in the right upper abdomen as the patient was positioned in reverse Trendelenburg.  Entry was clean.  I induced carbon dioxide insufflation.  Camera inspection revealed no injury.  There were some periumbilical omental adhesions consistent with her prior epigastric herniaepair.  However nothing too severe.Extra ports were carefully placed under direct laparoscopic visualization.  I turned attention to the right upper quadrant.  Gallbladder was edematous with some ventral adhesions.  No evidence of perforation or necrosis.  The gallbladder fundus was elevated cephalad. I used harmonic dissection to free adhesions of greater omentum and duodenum off the ventral surface of the gallbladder.  We continued careful dissection to free the peritoneal coverings between the gallbladder and the liver on the posteriolateral and anteriomedial walls.  I used careful blunt and hook dissection to help  get a good critical view of the cystic artery and cystic duct. I did further dissection to free a few centimeters of the gallbladder off the liver bed to get a good critical view of the infundibulum and cystic duct.I dissected out the cystic artery; and, after getting a good 360 view, ligated the anterior & posterior branches of the cystic artery close on the infundibulum using the Harmonic ultrasonic dissection.  I skeletonized the cystic duct.  I placed a clip on the infundibulum. I did a partial cystic duct-otomy and ensured patency. I placed a 5 [place] cholangiocatheter through a puncture site at the right subcostal ridge of the abdominal wall and directed it into the cystic duct.  We ran a cholangiogram with dilute radio-opaque contrast and continuous fluoroscopy. Contrast flowed from a side branch consistent with cystic duct cannulization. Contrast flowed up the common hepatic duct into the right and left intrahepatic chains out to secondary radicals. Contrast flowed down the common bile duct easily across the normal ampulla into the duodenum.  This was consistent with a normal cholangiogram.  I removed the cholangiocatheter.  Because assisted check was quite edematous, I do not feel the clips would work.  We tried one or two but they could barely go across it since it was so thick.  Therefore, I transected the cystic duct at the infundibulum with the Harmonic ultrasonic dissector.  I then ligated the cystic duct stump with 0 PDS Endoloops 2 to good result.   I freed the gallbladder from its remaining attachments to the liver. I ensured hemostasis on the gallbladder fossa of the liver and elsewhere. I inspected the rest of the abdomen & detected no injury nor bleeding elsewhere.  I laced the gallbladder inside an ecosac.  I removed the gallbladder after opening up the fascia to 2 cm to allow the thickened gallbladder with large gallstones out.  We did reinspection and copes irrigation.  Hemostasis was  excellent.  Blood loss had been quite minimal.  Leak of bile.  Ligation of cystic duct intact   I closed the subxiphoid fascia transversely using #1 PDS interrupted stitches. I closed the skin using 4-0 monocryl stitch.  Sterile dressings were applied. The patient was extubated & arrived in the PACU in stable condition..  I had discussed postoperative care with the patient in the holding area. I discussed operative findings, updated the patient's status, discussed probable steps to recovery, and gave postoperative recommendations to the patient's close friend.  Recommendations were made.  Questions were answered.  She expressed understanding & appreciation.  Adin Hector, M.D., F.A.C.S. Gastrointestinal and Minimally Invasive Surgery [facility] [address], [place], [place] [address] [phone] Main / Paging  01/05/2017 2:27 PM

## 2017-01-05 NOTE — Progress Notes (Signed)
Paged [email or web address] to make him aware of patients Positive MRSA due to patient being in OR. No new orders at this time.

## 2017-01-05 NOTE — H&P (View-Only) (Signed)
Subjective/Chief Complaint: Feels ok   Objective: Vital signs in last 24 hours: Temp:  [97.9 F (36.6 C)-99.4 F (37.4 C)] 98.3 F (36.8 C) (09/03 0621) Pulse Rate:  [83-105] 94 (09/03 0621) Resp:  [16-20] 18 (09/03 0621) BP: (101-136)/(76-107) 136/80 (09/03 0621) SpO2:  [98 %-100 %] 100 % (09/03 0621) Weight:  [81.6 kg (180 lb)] 81.6 kg (180 lb) (09/02 1636) Last BM Date: 01/04/17  Intake/Output from previous day: 09/02 0701 - 09/03 0700 In: 1000 [IV Piggyback:1000] Out: -  Intake/Output this shift: No intake/output data recorded.  General appearance: alert and cooperative Resp: clear to auscultation bilaterally Cardio: regular rate and rhythm GI: soft, mild RUQ tenderness  Lab Results:   Recent Labs  01/04/17 1730 01/05/17 0429  WBC 8.2 8.7  HGB 8.0* 7.2*  HCT 28.9* 26.6*  PLT 584* 480*   BMET  Recent Labs  01/04/17 1730 01/05/17 0429  NA 135 136  K 3.7 3.5  CL 105 106  CO2 20* 22  GLUCOSE 95 98  BUN 7 9  CREATININE 0.75 0.77  CALCIUM 9.8 9.4   PT/INR No results for input(s): LABPROT, INR in the last 72 hours. ABG No results for input(s): PHART, HCO3 in the last 72 hours.  Invalid input(s): PCO2, PO2  Studies/Results: Ct Abdomen Pelvis W Contrast  Result Date: 01/04/2017 CLINICAL DATA:  Epigastric pain for 4 days. Patient status post hysterectomy August 2nd 2018. EXAM: CT ABDOMEN AND PELVIS WITH CONTRAST TECHNIQUE: Multidetector CT imaging of the abdomen and pelvis was performed using the standard protocol following bolus administration of intravenous contrast. CONTRAST:  112mL ISOVUE-300 IOPAMIDOL (ISOVUE-300) INJECTION 61% COMPARISON:  Sep 12, 2016 FINDINGS: Lower chest: Mild dependent atelectasis of both lung bases are noted. The heart size is normal. Hepatobiliary: The liver is normal without focal liver lesion. There is gallbladder wall thickening with pericholecystic fluid and inflammation. Possible gallstone or sludge noted. The biliary  tree is normal. Pancreas: Unremarkable. No pancreatic ductal dilatation or surrounding inflammatory changes. Spleen: No acute abnormalities identified in the spleen. Adrenals/Urinary Tract: Adrenal glands are unremarkable. Kidneys are normal, without renal calculi, focal lesion, or hydronephrosis. Bladder is unremarkable. Stomach/Bowel: The stomach is normal. There is no small bowel obstruction. The appendix is normal. There is mild bowel wall thickening of the sigmoid colon and rectum. This may be postsurgical inflammation/change. Vascular/Lymphatic: No significant vascular findings are present. No enlarged abdominal or pelvic lymph nodes. Reproductive: Patient status post prior hysterectomy. There is mild inflammation and stranding near the cervix likely postsurgical change. Other: None. Musculoskeletal: No acute or significant osseous findings. IMPRESSION: Findings suspicious for cholecystitis. Patient status post prior hysterectomy. There is mild inflammation and stranding near the cervix likely postsurgical change. There is mild bowel wall thickening of the sigmoid colon and rectum. This may be postsurgical inflammation/change. Consider follow-up CT of the pelvis in 2-3 months to ensure resolution and to ensure no persistent bowel wall thickening is present. Electronically Signed   By: Abelardo Diesel M.D.   On: 01/04/2017 20:07   US Abdomen Limited Ruq  Result Date: 01/05/2017 CLINICAL DATA:  Right upper quadrant pain. EXAM: ULTRASOUND ABDOMEN LIMITED RIGHT UPPER QUADRANT COMPARISON:  CT 01/04/2017 FINDINGS: Gallbladder: Multiple gallbladder calculi measuring up to 13 mm. Moderate gallbladder mural thickening, over 6 mm. Edema within the gallbladder wall. Common bile duct: Diameter: Normal, 3.2 mm. Liver: No focal lesion identified. Within normal limits in parenchymal echogenicity. Portal vein is patent on color Doppler imaging with normal direction of blood flow  towards the liver. IMPRESSION: Cholelithiasis  with moderate gallbladder mural thickening/ edema. Findings are concerning for possible cholecystitis. Electronically Signed   By: Andreas Newport M.D.   On: 01/05/2017 01:16    Anti-infectives: Anti-infectives    Start     Dose/Rate Route Frequency Ordered Stop   01/05/17 0400  cefTRIAXone (ROCEPHIN) 2 g in dextrose 5 % 50 mL IVPB     2 g 100 mL/hr over 30 Minutes Intravenous Every 24 hours 01/05/17 0313     01/05/17 0315  valACYclovir (VALTREX) tablet 500 mg     500 mg Oral 2 times daily 01/05/17 0313        Assessment/Plan: s/p Procedure(s): LAPAROSCOPIC CHOLECYSTECTOMY WITH INTRAOPERATIVE CHOLANGIOGRAM (N/A) Plan for lap chole with ioc today. Risks and benefits of the surgery discussed with patient and she understands and wishes to proceed  LOS: 0 days    TOTH III,Jayonna Meyering S 01/05/2017

## 2017-01-05 NOTE — ED Notes (Signed)
Bed: KR83 Expected date: 01/05/17 Expected time:  Means of arrival:  Comments: Room 5

## 2017-01-06 ENCOUNTER — Other Ambulatory Visit: Payer: Self-pay | Admitting: Obstetrics

## 2017-01-06 ENCOUNTER — Encounter (HOSPITAL_COMMUNITY): Payer: Self-pay | Admitting: Surgery

## 2017-01-06 DIAGNOSIS — B373 Candidiasis of vulva and vagina: Secondary | ICD-10-CM

## 2017-01-06 DIAGNOSIS — B3731 Acute candidiasis of vulva and vagina: Secondary | ICD-10-CM

## 2017-01-06 LAB — HIV ANTIBODY (ROUTINE TESTING W REFLEX): HIV SCREEN 4TH GENERATION: NONREACTIVE

## 2017-01-06 MED ORDER — OXYCODONE HCL 5 MG PO TABS: 5.0000 mg | ORAL_TABLET | ORAL | 0 refills | Status: DC | PRN

## 2017-01-06 NOTE — Care Management Note (Signed)
Case Management Note  Patient Details  Name: Christina Valenzuela MRN: [id number] Date of Birth: 11-Oct-1975  Subjective/Objective:  41 y/o f admitted w/acute on chronic cholecystitis. S/p lap chole. From home.                   Action/Plan:d/c home.   Expected Discharge Date:  01/06/17               Expected Discharge Plan:  Home/Self Care  In-House Referral:     Discharge planning Services  CM Consult  Post Acute Care Choice:    Choice offered to:     DME Arranged:    DME Agency:     HH Arranged:    HH Agency:     Status of Service:  Completed, signed off  If discussed at [employer] of Stay Meetings, dates discussed:    Additional Comments:  Dessa Phi, RN 01/06/2017, 1:11 PM

## 2017-01-06 NOTE — Discharge Summary (Signed)
[facility] Discharge Summary   Patient ID: Christina Valenzuela MRN: [id number] DOB/AGE: [birth year] 41 y.o.  Admit date: 01/04/2017 Discharge date: 01/06/2017  Admitting Diagnosis: RUQ pain Cholelithiasis with cholecystitis  Discharge Diagnosis Patient Active Problem List   Diagnosis Date Noted  . Acute on chronic cholecystitis s/p lap cholecystectomy 01/05/2017 01/05/2017  . Postoperative anemia from TAH 11/2016 01/05/2017  . MRSA (methicillin resistant Staphylococcus aureus) colonization 01/05/2017  . Iron deficiency anemia due to chronic blood loss 10/24/2016  . Mixed incontinence 10/24/2016  . Chronic anemia 09/08/2015  . Pica 09/08/2015  . Primary insomnia 08/22/2015  . Depression with anxiety 08/22/2015  . Alcohol abuse 08/22/2015  . Headache 08/22/2015  . BV (bacterial vaginosis) 02/16/2014  . History of uterine leiomyoma s/p TAH 11/28/2016 02/16/2014  . UTI symptoms 02/16/2014  . Leiomyoma of uterus, unspecified 08/25/2013  . Dysmenorrhea 08/25/2013  . Vaginitis and vulvovaginitis, unspecified 08/25/2013  . 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    Consultants None  Imaging: Dg Cholangiogram Operative  Result Date: 01/05/2017 CLINICAL DATA:  Cholelithiasis EXAM: INTRAOPERATIVE CHOLANGIOGRAM TECHNIQUE: Cholangiographic images from the C-arm fluoroscopic device were submitted for interpretation post-operatively. Please see the procedural report for the amount of contrast and the fluoroscopy time utilized. COMPARISON:  01/05/2017 FINDINGS: Intraoperative cholangiogram performed during the operative procedure. The cystic duct, common hepatic duct, and common bile duct are patent. No dilatation or obstruction. No stricture or filling defect. Contrast drains into the duodenum. IMPRESSION: Patent biliary system. Electronically Signed   By: Jerilynn Mages.  Shick M.D.   On: 01/05/2017 14:07   Ct Abdomen Pelvis W Contrast  Result Date: 01/04/2017 CLINICAL DATA:  Epigastric pain for 4  days. Patient status post hysterectomy August 2nd 2018. EXAM: CT ABDOMEN AND PELVIS WITH CONTRAST TECHNIQUE: Multidetector CT imaging of the abdomen and pelvis was performed using the standard protocol following bolus administration of intravenous contrast. CONTRAST:  15mL ISOVUE-300 IOPAMIDOL (ISOVUE-300) INJECTION 61% COMPARISON:  Sep 12, 2016 FINDINGS: Lower chest: Mild dependent atelectasis of both lung bases are noted. The heart size is normal. Hepatobiliary: The liver is normal without focal liver lesion. There is gallbladder wall thickening with pericholecystic fluid and inflammation. Possible gallstone or sludge noted. The biliary tree is normal. Pancreas: Unremarkable. No pancreatic ductal dilatation or surrounding inflammatory changes. Spleen: No acute abnormalities identified in the spleen. Adrenals/Urinary Tract: Adrenal glands are unremarkable. Kidneys are normal, without renal calculi, focal lesion, or hydronephrosis. Bladder is unremarkable. Stomach/Bowel: The stomach is normal. There is no small bowel obstruction. The appendix is normal. There is mild bowel wall thickening of the sigmoid colon and rectum. This may be postsurgical inflammation/change. Vascular/Lymphatic: No significant vascular findings are present. No enlarged abdominal or pelvic lymph nodes. Reproductive: Patient status post prior hysterectomy. There is mild inflammation and stranding near the cervix likely postsurgical change. Other: None. Musculoskeletal: No acute or significant osseous findings. IMPRESSION: Findings suspicious for cholecystitis. Patient status post prior hysterectomy. There is mild inflammation and stranding near the cervix likely postsurgical change. There is mild bowel wall thickening of the sigmoid colon and rectum. This may be postsurgical inflammation/change. Consider follow-up CT of the pelvis in 2-3 months to ensure resolution and to ensure no persistent bowel wall thickening is present. Electronically  Signed   By: Abelardo Diesel M.D.   On: 01/04/2017 20:07   US Abdomen Limited Ruq  Result Date: 01/05/2017 CLINICAL DATA:  Right upper quadrant pain. EXAM: ULTRASOUND ABDOMEN LIMITED RIGHT UPPER QUADRANT COMPARISON:  CT 01/04/2017 FINDINGS: Gallbladder: Multiple gallbladder calculi measuring  up to 13 mm. Moderate gallbladder mural thickening, over 6 mm. Edema within the gallbladder wall. Common bile duct: Diameter: Normal, 3.2 mm. Liver: No focal lesion identified. Within normal limits in parenchymal echogenicity. Portal vein is patent on color Doppler imaging with normal direction of blood flow towards the liver. IMPRESSION: Cholelithiasis with moderate gallbladder mural thickening/ edema. Findings are concerning for possible cholecystitis. Electronically Signed   By: Andreas Newport M.D.   On: 01/05/2017 01:16    Procedures Dr. Johney Maine (01/05/17) - Laparoscopic Cholecystectomy with [place] Hospital Course:  Christina Valenzuela is a 41yo female who presented to [facility] 9/3 with 4 days of epigastric pain.  Workup included CT and ultrasound which showed stones in the gallbladder was some gallbladder wall thickening.  Patient was admitted and underwent procedure listed above for cholecystitis with cholelithiasis.  Tolerated procedure well and was transferred to the floor.  Diet was advanced as tolerated.  On POD1 the patient was voiding well, tolerating diet, ambulating well, pain well controlled, vital signs stable, incisions c/d/i and felt stable for discharge home.  Patient will follow up in our office in 2-3 weeks and knows to call with questions or concerns.  She will call to confirm appointment date/time.    I have personally reviewed the patients medication history on the [place] controlled substance database.    Physical Exam: General:  Alert, NAD, pleasant, comfortable Cardio: RRR Pulm: effort normal Abd:  Soft, ND, appropriately tender, multiple lap incisions covered with clean/dry dressings  Allergies as  of 01/06/2017      Reactions   Hydrocodone-acetaminophen Itching   Latex Swelling   Vaginal swelling      Medication List    TAKE these medications   diphenhydrAMINE 25 MG tablet Commonly known as:  BENADRYL Take 100 mg by mouth at bedtime.   diphenhydramine-acetaminophen 25-500 MG Tabs tablet Commonly known as:  TYLENOL PM Take 2 tablets by mouth at bedtime as needed (sleep).   ibuprofen 800 MG tablet Commonly known as:  ADVIL,MOTRIN Take 1 tablet (800 mg total) by mouth every 8 (eight) hours as needed for cramping. Take 1 tab po every 8 hrs as needed for pain   medroxyPROGESTERone 10 MG tablet Commonly known as:  PROVERA Take 1 tablet (10 mg total) by mouth daily.   MedroxyPROGESTERone Acetate 150 MG/ML Susy INJECT 1 ML (150 MG TOTAL) INTO THE MUSCLE EVERY 3 (THREE) MONTHS.   metroNIDAZOLE 0.75 % vaginal gel Commonly known as:  METROGEL VAGINAL Place 1 Applicatorful vaginally at bedtime.   omeprazole 40 MG capsule Commonly known as:  PRILOSEC Take 40 mg by mouth daily as needed (reflux).   oxyCODONE 5 MG immediate release tablet Commonly known as:  Oxy IR/ROXICODONE Take 1 tablet (5 mg total) by mouth every 4 (four) hours as needed for moderate pain, severe pain or breakthrough pain.   polyethylene glycol packet Commonly known as:  MIRALAX / GLYCOLAX Take 17 g by mouth daily as needed for mild constipation or moderate constipation.   valACYclovir 500 MG tablet Commonly known as:  VALTREX Take 1 tablet (500 mg total) by mouth 2 (two) times daily. What changed:  when to take this  reasons to take this            Discharge Care Instructions        Start     Ordered   01/06/17 0000  oxyCODONE (OXY IR/ROXICODONE) 5 MG immediate release tablet  Every 4 hours PRN     01/06/17  1241   01/06/17 0000  Discharge patient    Question Answer Comment  Discharge disposition 01-Home or Self Care   Discharge patient date 01/06/2017      01/06/17 1241   01/05/17 0000   Discharge instructions    Comments:  See Discharge Instructions If you are not getting better after two weeks or are noticing you are getting worse, contact our office [phone] for further advice.  We may need to adjust your medications, re-evaluate you in the office, send you to the emergency room, or see what other things we can do to help. The clinic staff is available to answer your questions during regular business hours (8:30am-5pm).  Please don't hesitate to call and ask to speak to one of our nurses for clinical concerns.    A surgeon from [facility] is always on call at the hospitals 24 hours/day If you have a medical emergency, go to the nearest emergency room or call 911.   01/05/17 1427   01/05/17 0000  Diet - low sodium heart healthy    Comments:  Follow a light diet the first few days at home.   Start with a bland diet such as soups, liquids, starchy foods, low fat foods, etc.   If you feel full, bloated, or constipated, stay on a full liquid or pureed/blenderized diet for a few days until you feel better and no longer constipated. Be sure to drink plenty of fluids every day to avoid getting dehydrated (feeling dizzy, not urinating, etc.). Gradually add a fiber supplement to your diet   01/05/17 1427   01/05/17 0000  Increase activity slowly    Comments:  Start light daily activities --- self-care, walking, climbing stairs- beginning the day after surgery.  Gradually increase activities as tolerated.  Control your pain to be active.  Stop when you are tired.  Ideally, walk several times a day, eventually an hour a day.   Most people are back to most day-to-day activities in a few weeks.  It takes 4-8 weeks to get back to unrestricted, intense activity. If you can walk 30 minutes without difficulty, it is safe to try more intense activity such as jogging, treadmill, bicycling, low-impact aerobics, swimming, etc. Save the most intensive and strenuous activity for  last (Usually 4-8 weeks after surgery) such as sit-ups, heavy lifting, contact sports, etc.  Refrain from any intense heavy lifting or straining until you are off narcotics for pain control.  You will have off days, but things should improve week-by-week. DO NOT PUSH THROUGH PAIN.  Let pain be your guide: If it hurts to do something, don't do it.  Pain is your body warning you to avoid that activity for another week until the pain goes down.   01/05/17 1427   01/05/17 0000  May walk up steps     01/05/17 1427   01/05/17 0000  Driving Restrictions    Comments:  You may drive when you are no longer taking narcotic prescription pain medication, you can comfortably wear a seatbelt, and you can safely make sudden turns/stops to protect yourself without hesitating due to pain.   01/05/17 1427   01/05/17 0000  Lifting restrictions    Comments:  If you can walk 30 minutes without difficulty, it is safe to try more intense activity such as jogging, treadmill, bicycling, low-impact aerobics, swimming, etc. Save the most intensive and strenuous activity for last (Usually 4-8 weeks after surgery) such as sit-ups, heavy lifting, contact sports,  etc.  Refrain from any intense heavy lifting or straining until you are off narcotics for pain control.  You will have off days, but things should improve week-by-week. DO NOT PUSH THROUGH PAIN.  Let pain be your guide: If it hurts to do something, don't do it.  Pain is your body warning you to avoid that activity for another week until the pain goes down.   01/05/17 1427   01/05/17 0000  Sexual Activity Restrictions    Comments:  You may have sexual intercourse when it is comfortable. If it hurts to do something, stop.   01/05/17 1427   01/05/17 0000  No wound care    Comments:  It is good for closed incision and even open wounds to be washed every day.  Shower every day.  Short baths are fine.  Wash the incisions and wounds clean with soap & water.    If you have a  closed incision(s), wash the incision with soap & water every day.  You may leave closed incisions open to air if it is dry.   You may cover the incision with clean gauze & replace it after your daily shower for comfort. If you have skin tapes (Steristrips) or skin glue (Dermabond) on your incision, leave them in place.  They will fall off on their own like a scab.  You may trim any edges that curl up with clean scissors.  If you have staples, set up an appointment for them to be removed in the office in 10 days after surgery.  If you have a drain, wash around the skin exit site with soap & water and place a new dressing of gauze or band aid around the skin every day.  Keep the drain site clean & dry.   01/05/17 1427   01/05/17 0000  Call MD for:    Comments:  FEVER > 101.5 F  (temperatures < 101.5 F are not significant)   01/05/17 1427   01/05/17 0000  Call MD for:  persistant nausea and vomiting     01/05/17 1427   01/05/17 0000  Call MD for:  severe uncontrolled pain     01/05/17 1427   01/05/17 0000  Call MD for:  redness, tenderness, or signs of infection (pain, swelling, redness, odor or green/yellow discharge around incision site)     01/05/17 1427   01/05/17 0000  Call MD for:  persistant dizziness or light-headedness     01/05/17 1427   01/05/17 0000  Call MD for:  extreme fatigue     01/05/17 1427       Follow-up Information    [facility].   Contact information: [place] [id number] [facility], [place]. Schedule an appointment as soon as possible for a visit in 3 week(s).   Specialty:  General Surgery Why:  To follow up after your operation, To follow up after your hospital stay Contact information: [address] [place] [phone]          Signed: Wellington Hampshire, [facility] 01/06/2017, 12:41 PM Pager:  [phone] Consults: [phone] Mon-Fri 7:00 am-4:30 pm Sat-Sun 7:00 am-11:30 am

## 2017-01-06 NOTE — Telephone Encounter (Signed)
Please review for refill.  

## 2017-01-07 LAB — TYPE AND SCREEN
ABO/RH(D): O POS
Antibody Screen: POSITIVE
DAT, IgG: NEGATIVE
UNIT DIVISION: 0
UNIT DIVISION: 0

## 2017-01-07 LAB — BPAM RBC
BLOOD PRODUCT EXPIRATION DATE: 201810032359
Blood Product Expiration Date: 201810032359
UNIT TYPE AND RH: 5100
Unit Type and Rh: 5100

## 2017-01-07 NOTE — Anesthesia Postprocedure Evaluation (Signed)
Anesthesia Post Note  Patient: Christina Valenzuela  Procedure(s) Performed: Procedure(s) (LRB): LAPAROSCOPIC CHOLECYSTECTOMY WITH INTRAOPERATIVE CHOLANGIOGRAM (N/A)     Patient location during evaluation: PACU Anesthesia Type: General Level of consciousness: awake and alert Pain management: pain level controlled Vital Signs Assessment: post-procedure vital signs reviewed and stable Respiratory status: spontaneous breathing, nonlabored ventilation, respiratory function stable and patient connected to nasal cannula oxygen Cardiovascular status: blood pressure returned to baseline and stable Postop Assessment: no signs of nausea or vomiting Anesthetic complications: no    Last Vitals:  Vitals:   01/06/17 0950 01/06/17 1341  BP: 105/77 115/76  Pulse: 90 90  Resp: 16 16  Temp: 37 C 37.4 C  SpO2: 100% 100%    Last Pain:  Vitals:   01/06/17 1341  TempSrc: Oral  PainSc:                  Obed Samek S

## 2017-10-20 ENCOUNTER — Other Ambulatory Visit: Payer: Self-pay | Admitting: Obstetrics

## 2017-10-20 DIAGNOSIS — B373 Candidiasis of vulva and vagina: Secondary | ICD-10-CM

## 2017-10-20 DIAGNOSIS — N946 Dysmenorrhea, unspecified: Secondary | ICD-10-CM

## 2017-10-20 DIAGNOSIS — B3731 Acute candidiasis of vulva and vagina: Secondary | ICD-10-CM

## 2017-12-30 ENCOUNTER — Other Ambulatory Visit: Payer: Self-pay

## 2017-12-30 ENCOUNTER — Emergency Department (HOSPITAL_BASED_OUTPATIENT_CLINIC_OR_DEPARTMENT_OTHER)
Admission: EM | Admit: 2017-12-30 | Discharge: 2017-12-30 | Disposition: A | Discharge status: Home / Self Care | Payer: Self-pay | Attending: Emergency Medicine | Admitting: Emergency Medicine

## 2017-12-30 ENCOUNTER — Encounter (HOSPITAL_BASED_OUTPATIENT_CLINIC_OR_DEPARTMENT_OTHER): Payer: Self-pay | Admitting: Emergency Medicine

## 2017-12-30 ENCOUNTER — Emergency Department (HOSPITAL_BASED_OUTPATIENT_CLINIC_OR_DEPARTMENT_OTHER): Payer: Self-pay

## 2017-12-30 DIAGNOSIS — F17210 Nicotine dependence, cigarettes, uncomplicated: Secondary | ICD-10-CM | POA: Insufficient documentation

## 2017-12-30 DIAGNOSIS — B9689 Other specified bacterial agents as the cause of diseases classified elsewhere: Secondary | ICD-10-CM

## 2017-12-30 DIAGNOSIS — N760 Acute vaginitis: Secondary | ICD-10-CM | POA: Insufficient documentation

## 2017-12-30 DIAGNOSIS — R0789 Other chest pain: Secondary | ICD-10-CM | POA: Insufficient documentation

## 2017-12-30 DIAGNOSIS — Z79899 Other long term (current) drug therapy: Secondary | ICD-10-CM | POA: Insufficient documentation

## 2017-12-30 DIAGNOSIS — D649 Anemia, unspecified: Secondary | ICD-10-CM | POA: Insufficient documentation

## 2017-12-30 LAB — CBC
HCT: 31.6 % — ABNORMAL LOW (ref 36.0–46.0)
HEMOGLOBIN: 9.6 g/dL — AB (ref 12.0–15.0)
MCH: 20.1 pg — AB (ref 26.0–34.0)
MCHC: 30.4 g/dL (ref 30.0–36.0)
MCV: 66.2 fL — ABNORMAL LOW (ref 78.0–100.0)
Platelets: 456 10*3/uL — ABNORMAL HIGH (ref 150–400)
RBC: 4.77 MIL/uL (ref 3.87–5.11)
RDW: 20 % — AB (ref 11.5–15.5)
WBC: 8.1 10*3/uL (ref 4.0–10.5)

## 2017-12-30 LAB — URINALYSIS, ROUTINE W REFLEX MICROSCOPIC
GLUCOSE, UA: NEGATIVE mg/dL
Hgb urine dipstick: NEGATIVE
Ketones, ur: NEGATIVE mg/dL
LEUKOCYTES UA: NEGATIVE
Nitrite: NEGATIVE
PROTEIN: NEGATIVE mg/dL
pH: 6 (ref 5.0–8.0)

## 2017-12-30 LAB — WET PREP, GENITAL
Sperm: NONE SEEN
Trich, Wet Prep: NONE SEEN
Yeast Wet Prep HPF POC: NONE SEEN

## 2017-12-30 LAB — BASIC METABOLIC PANEL
Anion gap: 7 (ref 5–15)
BUN: 9 mg/dL (ref 6–20)
CALCIUM: 9 mg/dL (ref 8.9–10.3)
CO2: 24 mmol/L (ref 22–32)
CREATININE: 0.86 mg/dL (ref 0.44–1.00)
Chloride: 109 mmol/L (ref 98–111)
GFR calc Af Amer: >60 mL/min (ref >60)
GFR calc non Af Amer: >60 mL/min (ref >60)
GLUCOSE: 88 mg/dL (ref 70–99)
Potassium: 3.6 mmol/L (ref 3.5–5.1)
Sodium: 140 mmol/L (ref 135–145)

## 2017-12-30 LAB — TROPONIN I: Troponin I: <0.03 ng/mL (ref <0.03)

## 2017-12-30 MED ORDER — METRONIDAZOLE 0.75 % VA GEL: 1.0000 | Freq: Every day | VAGINAL | 0 refills | Status: DC

## 2017-12-30 NOTE — Discharge Instructions (Signed)
Use Metrogel every night for 5 days Please follow up with your doctor Return if you are worsening

## 2017-12-30 NOTE — ED Provider Notes (Signed)
[facility] EMERGENCY DEPARTMENT Provider Note   CSN: [id number] Arrival date & time: 12/30/17  0932     History   Chief Complaint Chief Complaint  Patient presents with  . Vaginal Discharge  . Chest Pain    with movement     HPI Christina Valenzuela is a 42 y.o. female who presents with chest pain and vaginal discharge.  Past medical history significant for anxiety/depression, recurrent vaginitis, GERD.  The patient states that over the past 1 to 2 weeks she has had central and right-sided chest pain.  It feels like a pressure.  She has it all the time and was really bad last night and her daughter urged her to come to the ED to get checked out.  She states that she has not had this before.  She endorses a lot of stress in her life especially with her husband and states that her chest pain gets worse when she talks to her husband.  Nothing seems to make it better.  She tried ibuprofen without relief.  She denies fever, cough, shortness of breath, wheezing, leg swelling. No recent surgery/travel/immobilization, hx of cancer, leg swelling, hemoptysis, prior DVT/PE, or hormone use.  Additionally she reports vaginal discharge which is thin and malodorous.  She has a history of recurrent vaginitis.  She is status post hysterectomy.  She is unsure of her husband has been faithful to her therefore she is requesting evaluation for this.  She is been having some pelvic pain which feels like cramping.  No dysuria, hematuria.  She has chronic urinary frequency.  HPI  Past Medical History:  Diagnosis Date  . Depression     Patient Active Problem List   Diagnosis Date Noted  . Acute on chronic cholecystitis s/p lap cholecystectomy 01/05/2017 01/05/2017  . Postoperative anemia from TAH 11/2016 01/05/2017  . MRSA (methicillin resistant Staphylococcus aureus) colonization 01/05/2017  . Iron deficiency anemia due to chronic blood loss 10/24/2016  . Mixed incontinence 10/24/2016  . Chronic  anemia 09/08/2015  . Pica 09/08/2015  . Primary insomnia 08/22/2015  . Depression with anxiety 08/22/2015  . Alcohol abuse 08/22/2015  . Headache 08/22/2015  . BV (bacterial vaginosis) 02/16/2014  . History of uterine leiomyoma s/p TAH 11/28/2016 02/16/2014  . UTI symptoms 02/16/2014  . Leiomyoma of uterus, unspecified 08/25/2013  . Dysmenorrhea 08/25/2013  . Vaginitis and vulvovaginitis, unspecified 08/25/2013  . 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    Past Surgical History:  Procedure Laterality Date  . ABDOMINAL HYSTERECTOMY  11/28/2016   [facility] - Dr Zigmund Daniel.  UTERINE LEIOMYOMA w/ MENORRHAGIA  . CESAREAN SECTION    . CESAREAN SECTION    . CHOLECYSTECTOMY N/A 01/05/2017   Procedure: LAPAROSCOPIC CHOLECYSTECTOMY WITH INTRAOPERATIVE CHOLANGIOGRAM;  Surgeon: Michael Boston, MD;  Location: [facility];  Service: General;  Laterality: N/A;  . EPIGASTRIC HERNIA REPAIR     with mesh.  Dr Excell Seltzer  . TUBAL LIGATION       OB History    Gravida  8   Para  4   Term  3   Preterm  1   AB  4   Living  5     SAB  2   TAB  2   Ectopic  0   Multiple  1   Live Births  5            Home Medications    Prior to Admission medications   Medication Sig Start Date End Date Taking? Authorizing Provider  diphenhydrAMINE (  BENADRYL) 25 MG tablet Take 100 mg by mouth at bedtime.    [provider]  diphenhydramine-acetaminophen (TYLENOL PM) 25-500 MG TABS tablet Take 2 tablets by mouth at bedtime as needed (sleep).    [provider]  ibuprofen (ADVIL,MOTRIN) 800 MG tablet TAKE 1 TABLET BY MOUTH 3 TIMES A DAY AS NEEDED FOR PAIN 10/23/17   Shelly Bombard, MD  medroxyPROGESTERone (PROVERA) 10 MG tablet Take 1 tablet (10 mg total) by mouth daily. Patient not taking: Reported on 09/04/2016 10/25/15   Shelly Bombard, MD  MedroxyPROGESTERone Acetate 150 MG/ML SUSY INJECT 1 ML (150 MG TOTAL) INTO THE MUSCLE EVERY 3 (THREE) MONTHS. Patient not taking: Reported on 01/05/2017  01/01/17   Shelly Bombard, MD  metroNIDAZOLE (METROGEL VAGINAL) 0.75 % vaginal gel Place 1 Applicatorful vaginally at bedtime. Patient not taking: Reported on 01/05/2017 09/08/16   Shelly Bombard, MD  omeprazole (PRILOSEC) 40 MG capsule Take 40 mg by mouth daily as needed (reflux).    [provider]  oxyCODONE (OXY IR/ROXICODONE) 5 MG immediate release tablet Take 1 tablet (5 mg total) by mouth every 4 (four) hours as needed for moderate pain, severe pain or breakthrough pain. 01/06/17   Meuth, Brooke A, PA-C  polyethylene glycol (MIRALAX / GLYCOLAX) packet Take 17 g by mouth daily as needed for mild constipation or moderate constipation.    [provider]  valACYclovir (VALTREX) 500 MG tablet Take 1 tablet (500 mg total) by mouth 2 (two) times daily. Patient taking differently: Take 500 mg by mouth daily as needed (outbreak).  09/04/16   Shelly Bombard, MD    Family History Family History  Problem Relation Age of Onset  . Diabetes Maternal Grandfather     Social History Social History   Tobacco Use  . Smoking status: Current Every Day Smoker    Packs/day: 0.25    Types: Cigarettes  . Smokeless tobacco: Never Used  Substance Use Topics  . Alcohol use: No    Alcohol/week: 0.0 standard drinks  . Drug use: No     Allergies   Hydrocodone-acetaminophen and Latex   Review of Systems Review of Systems  Constitutional: Negative for chills and fever.  Respiratory: Negative for cough, shortness of breath and wheezing.   Cardiovascular: Positive for chest pain. Negative for palpitations and leg swelling.  Gastrointestinal: Positive for abdominal pain and nausea. Negative for diarrhea and vomiting.  Genitourinary: Positive for vaginal discharge. Negative for difficulty urinating, dysuria, flank pain and hematuria.  Neurological: Negative for syncope and light-headedness.  All other systems reviewed and are negative.    Physical Exam Updated Vital Signs BP (!)  117/94 (BP Location: Right Arm)   Pulse 95   Temp 98.5 F (36.9 C) (Oral)   Resp 18   Ht 5\' 2"  (1.575 m)   Wt 80.7 kg   LMP 08/31/2016   SpO2 99%   BMI 32.56 kg/m   Physical Exam  Constitutional: She is oriented to person, place, and time. She appears well-developed and well-nourished. No distress.  Calm, cooperative  HENT:  Head: Normocephalic and atraumatic.  Eyes: Pupils are equal, round, and reactive to light. Conjunctivae are normal. Right eye exhibits no discharge. Left eye exhibits no discharge. No scleral icterus.  Neck: Normal range of motion.  Cardiovascular: Normal rate and regular rhythm.  Pulmonary/Chest: Effort normal and breath sounds normal. No respiratory distress.  Abdominal: Soft. Bowel sounds are normal. She exhibits no distension. There is tenderness (epigastric tenderness).  Genitourinary:  Genitourinary Comments: Pelvic: No inguinal lymphadenopathy or inguinal hernia noted. Normal external genitalia. No pain with speculum insertion. Os is surgically abscess. White discharge in vaginal vault. Chaperone present during exam.   Neurological: She is alert and oriented to person, place, and time.  Skin: Skin is warm and dry.  Psychiatric: She has a normal mood and affect. Her behavior is normal.  Nursing note and vitals reviewed.    ED Treatments / Results  Labs (all labs ordered are listed, but only abnormal results are displayed) Labs Reviewed  WET PREP, GENITAL - Abnormal; Notable for the following components:      Result Value   Clue Cells Wet Prep HPF POC PRESENT (*)    WBC, Wet Prep HPF POC MODERATE (*)    All other components within normal limits  URINALYSIS, ROUTINE W REFLEX MICROSCOPIC - Abnormal; Notable for the following components:   Specific Gravity, Urine >1.030 (*)    Bilirubin Urine SMALL (*)    All other components within normal limits  CBC - Abnormal; Notable for the following components:   Hemoglobin 9.6 (*)    HCT 31.6 (*)    MCV  66.2 (*)    MCH 20.1 (*)    RDW 20.0 (*)    Platelets 456 (*)    All other components within normal limits  BASIC METABOLIC PANEL  TROPONIN I  RPR  HIV ANTIBODY (ROUTINE TESTING)  GC/CHLAMYDIA PROBE AMP ([place]) NOT AT [facility]    EKG None  Radiology Dg Chest 2 View  Result Date: 12/30/2017 CLINICAL DATA:  Chest pain. EXAM: CHEST - 2 VIEW COMPARISON:  Radiographs of March 10, 2012. FINDINGS: The heart size and mediastinal contours are within normal limits. Both lungs are clear. No pneumothorax or pleural effusion is noted. The visualized skeletal structures are unremarkable. IMPRESSION: No active cardiopulmonary disease. Electronically Signed   By: Marijo Conception, M.D.   On: 12/30/2017 10:31    Procedures Procedures (including critical care time)  Medications Ordered in ED Medications - No data to display   Initial Impression / Assessment and Plan / ED Course  I have reviewed the triage vital signs and the nursing notes.  Pertinent labs & imaging results that were available during my care of the patient were reviewed by me and considered in my medical decision making (see chart for details).  42 year old with chest pain/pressure for 1-2 weeks exacerbated by talking to her husband. Likely stress/anxiety related as she endorses multiple stressors for several months. Chest pain work up is reassuring. Doubt ACS, PE, pericarditis, esophageal rupture, tension pneumothorax, aortic dissection, cardiac tamponade. EKG is SR with prolonged PR. CXR is negative. Troponin is 0. Since pain has been constant for at least a week, will defer delta trop. Labs are remarkable for anemia which is improved from baseline. HEART score is 2. PERC negative. She can follow up with PCP and her therapist.  As far as her vaginal discharge/pelvic pain I believe it is likely related to [place]. She is non-tender exam. Pelvic exam is remarkable for moderate white discharge. She does not have a uterus. Wet prep  showed clue cells. G&C were sent. UA shows small bilirubin and specific gravity >1.03. Will tx with Metrogel per her request.   Final Clinical Impressions(s) / ED Diagnoses   Final diagnoses:  Atypical chest pain  Bacterial vaginosis  Anemia, unspecified type    ED Discharge Orders    None  Recardo Evangelist, PA-C 12/30/17 [place], [place], DO 12/30/17 [phone]

## 2017-12-30 NOTE — ED Notes (Signed)
Urine sample wasted after collection , UA will be delayed , waiting to obtain another sample.

## 2017-12-30 NOTE — ED Triage Notes (Addendum)
Pt reports vaginal discharge, odorous, clear, itching. No bleeding. Reports urinary urgency and frequency  , no dysuria.   Also reports sternal pain/ rib cage pain with moment or reaching for objects, no coughing nor shortness of breath.

## 2017-12-31 LAB — RPR: RPR Ser Ql: NONREACTIVE

## 2017-12-31 LAB — HIV ANTIBODY (ROUTINE TESTING W REFLEX): HIV Screen 4th Generation wRfx: NONREACTIVE

## 2018-01-01 LAB — GC/CHLAMYDIA PROBE AMP (~~LOC~~) NOT AT ARMC
Chlamydia: NEGATIVE
Neisseria Gonorrhea: NEGATIVE

## 2018-01-08 MED FILL — metroNIDAZOLE 0.75 % GEL: 0.75 | 7 days supply | Qty: 70 | Fill #0

## 2018-02-22 ENCOUNTER — Other Ambulatory Visit: Payer: Self-pay | Admitting: Obstetrics

## 2018-05-18 ENCOUNTER — Other Ambulatory Visit: Payer: Self-pay

## 2018-05-18 ENCOUNTER — Emergency Department (HOSPITAL_BASED_OUTPATIENT_CLINIC_OR_DEPARTMENT_OTHER)
Admission: EM | Admit: 2018-05-18 | Discharge: 2018-05-18 | Disposition: A | Discharge status: Home / Self Care | Payer: Self-pay | Attending: Emergency Medicine | Admitting: Emergency Medicine

## 2018-05-18 ENCOUNTER — Emergency Department (HOSPITAL_BASED_OUTPATIENT_CLINIC_OR_DEPARTMENT_OTHER): Payer: Self-pay

## 2018-05-18 ENCOUNTER — Encounter (HOSPITAL_BASED_OUTPATIENT_CLINIC_OR_DEPARTMENT_OTHER): Payer: Self-pay | Admitting: Emergency Medicine

## 2018-05-18 DIAGNOSIS — R102 Pelvic and perineal pain: Secondary | ICD-10-CM | POA: Insufficient documentation

## 2018-05-18 DIAGNOSIS — N898 Other specified noninflammatory disorders of vagina: Secondary | ICD-10-CM | POA: Insufficient documentation

## 2018-05-18 DIAGNOSIS — Z91040 Latex allergy status: Secondary | ICD-10-CM | POA: Insufficient documentation

## 2018-05-18 DIAGNOSIS — B9689 Other specified bacterial agents as the cause of diseases classified elsewhere: Secondary | ICD-10-CM

## 2018-05-18 DIAGNOSIS — N838 Other noninflammatory disorders of ovary, fallopian tube and broad ligament: Secondary | ICD-10-CM

## 2018-05-18 DIAGNOSIS — N760 Acute vaginitis: Secondary | ICD-10-CM | POA: Insufficient documentation

## 2018-05-18 DIAGNOSIS — F17210 Nicotine dependence, cigarettes, uncomplicated: Secondary | ICD-10-CM | POA: Insufficient documentation

## 2018-05-18 LAB — COMPREHENSIVE METABOLIC PANEL
ALT: 17 U/L (ref 0–44)
ANION GAP: 6 (ref 5–15)
AST: 30 U/L (ref 15–41)
Albumin: 4 g/dL (ref 3.5–5.0)
Alkaline Phosphatase: 57 U/L (ref 38–126)
BUN: 8 mg/dL (ref 6–20)
CO2: 20 mmol/L — ABNORMAL LOW (ref 22–32)
Calcium: 9.2 mg/dL (ref 8.9–10.3)
Chloride: 108 mmol/L (ref 98–111)
Creatinine, Ser: 0.72 mg/dL (ref 0.44–1.00)
Glucose, Bld: 82 mg/dL (ref 70–99)
POTASSIUM: 3.3 mmol/L — AB (ref 3.5–5.1)
Sodium: 134 mmol/L — ABNORMAL LOW (ref 135–145)
TOTAL PROTEIN: 8 g/dL (ref 6.5–8.1)
Total Bilirubin: 0.6 mg/dL (ref 0.3–1.2)

## 2018-05-18 LAB — CBC
HEMATOCRIT: 34.9 % — AB (ref 36.0–46.0)
HEMOGLOBIN: 10.2 g/dL — AB (ref 12.0–15.0)
MCH: 20.5 pg — ABNORMAL LOW (ref 26.0–34.0)
MCHC: 29.2 g/dL — ABNORMAL LOW (ref 30.0–36.0)
MCV: 70.1 fL — ABNORMAL LOW (ref 80.0–100.0)
NRBC: 0 % (ref 0.0–0.2)
Platelets: 408 10*3/uL — ABNORMAL HIGH (ref 150–400)
RBC: 4.98 MIL/uL (ref 3.87–5.11)
RDW: 19.1 % — ABNORMAL HIGH (ref 11.5–15.5)
WBC: 11.8 10*3/uL — AB (ref 4.0–10.5)

## 2018-05-18 LAB — URINALYSIS, ROUTINE W REFLEX MICROSCOPIC
BILIRUBIN URINE: NEGATIVE
Glucose, UA: NEGATIVE mg/dL
KETONES UR: NEGATIVE mg/dL
Leukocytes, UA: NEGATIVE
Nitrite: NEGATIVE
PROTEIN: NEGATIVE mg/dL
Specific Gravity, Urine: 1.025 (ref 1.005–1.030)
pH: 5.5 (ref 5.0–8.0)

## 2018-05-18 LAB — URINALYSIS, MICROSCOPIC (REFLEX)

## 2018-05-18 LAB — WET PREP, GENITAL
Sperm: NONE SEEN
Trich, Wet Prep: NONE SEEN
Yeast Wet Prep HPF POC: NONE SEEN

## 2018-05-18 LAB — LIPASE, BLOOD: LIPASE: 21 U/L (ref 11–51)

## 2018-05-18 MED ORDER — METRONIDAZOLE 0.75 % VA GEL: 1.0000 | Freq: Every day | VAGINAL | 0 refills | Status: AC

## 2018-05-18 MED FILL — metroNIDAZOLE 0.75 % GEL: 0.75 | 7 days supply | Qty: 70 | Fill #0

## 2018-05-18 NOTE — ED Triage Notes (Signed)
Occ low abdominal cramping x1 month.  Constant and worse since yesterday. Denies n/v/d. Denies urinary sx.

## 2018-05-18 NOTE — ED Notes (Signed)
Pt enrolled in aromatherapy pain trial 

## 2018-05-18 NOTE — ED Notes (Signed)
Pt states she has had a piece of glass in her R thumb since 2017 and would like to have it removed.

## 2018-05-18 NOTE — ED Notes (Signed)
Patient transported to Ultrasound 

## 2018-05-18 NOTE — Discharge Instructions (Signed)
Use MetroGel at bedtime for 5 days.  A mass was seen on your right ovary.  This could be a cyst, however given its irregular appearance, the radiologist is recommending you follow-up with OB/GYN and have a repeat ultrasound in 6 months (or maybe sooner based on your OB/GYN's opinion).  Please follow-up with them as soon as possible.  Please return to emergency department you develop any new or worsening symptoms.

## 2018-05-19 ENCOUNTER — Encounter (HOSPITAL_BASED_OUTPATIENT_CLINIC_OR_DEPARTMENT_OTHER): Payer: Self-pay

## 2018-05-19 LAB — GC/CHLAMYDIA PROBE AMP (~~LOC~~) NOT AT ARMC
Chlamydia: NEGATIVE
Neisseria Gonorrhea: NEGATIVE

## 2018-05-19 NOTE — ED Provider Notes (Signed)
[facility] EMERGENCY DEPARTMENT Provider Note   CSN: [id number] Arrival date & time: 05/18/18  1103     History   Chief Complaint Chief Complaint  Patient presents with  . Abdominal Pain  . Foreign Body in Skin    HPI Christina Valenzuela is a 43 y.o. adult with history of abdominal hysterectomy 08/2016 who presents with a one month history of lower abdominal cramping. She describes it like menstrual cramps. It is intermittent. She denies any vaginal bleeding. She has had a white discharge and feels like she might have vaginitis. She denies any significant concern for STD exposure. She denies any fevers, chest pain, shortness of breath, nausea, vomiting, diarrhea. She has not taking any medications for her pain.   She also requests evaluation of her right thumb. She reports getting a piece of glass stuck in her finger 2 years ago and has intermittent pain since with grabbing certain things. She reports she has learned to live with it, but asks about options for its removal.   HPI  Past Medical History:  Diagnosis Date  . Depression     Patient Active Problem List   Diagnosis Date Noted  . Acute on chronic cholecystitis s/p lap cholecystectomy 01/05/2017 01/05/2017  . Postoperative anemia from TAH 11/2016 01/05/2017  . MRSA (methicillin resistant Staphylococcus aureus) colonization 01/05/2017  . Iron deficiency anemia due to chronic blood loss 10/24/2016  . Mixed incontinence 10/24/2016  . Chronic anemia 09/08/2015  . Pica 09/08/2015  . Primary insomnia 08/22/2015  . Depression with anxiety 08/22/2015  . Alcohol abuse 08/22/2015  . Headache 08/22/2015  . BV (bacterial vaginosis) 02/16/2014  . History of uterine leiomyoma s/p TAH 11/28/2016 02/16/2014  . UTI symptoms 02/16/2014  . Leiomyoma of uterus, unspecified 08/25/2013  . Dysmenorrhea 08/25/2013  . Vaginitis and vulvovaginitis, unspecified 08/25/2013  . 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    Past Surgical  History:  Procedure Laterality Date  . ABDOMINAL HYSTERECTOMY  11/28/2016   [facility] - Dr Zigmund Daniel.  UTERINE LEIOMYOMA w/ MENORRHAGIA  . CESAREAN SECTION    . CESAREAN SECTION    . CHOLECYSTECTOMY N/A 01/05/2017   Procedure: LAPAROSCOPIC CHOLECYSTECTOMY WITH INTRAOPERATIVE CHOLANGIOGRAM;  Surgeon: Michael Boston, MD;  Location: [facility];  Service: General;  Laterality: N/A;  . EPIGASTRIC HERNIA REPAIR     with mesh.  Dr Excell Seltzer  . TUBAL LIGATION       OB History    Gravida  8   Para  4   Term  3   Preterm  1   AB  4   Living  5     SAB  2   TAB  2   Ectopic  0   Multiple  1   Live Births  5            Home Medications    Prior to Admission medications   Medication Sig Start Date End Date Taking? Authorizing Provider  metroNIDAZOLE (METROGEL VAGINAL) 0.75 % vaginal gel Place 1 Applicatorful vaginally at bedtime for 5 days. 05/18/18 05/23/18  Solimar Maiden, [name], PA-C  valACYclovir (VALTREX) 500 MG tablet Take 1 tablet (500 mg total) by mouth 2 (two) times daily. Patient taking differently: Take 500 mg by mouth daily as needed (outbreak).  09/04/16   Shelly Bombard, MD    Family History Family History  Problem Relation Age of Onset  . Diabetes Maternal Grandfather     Social History Social History   Tobacco Use  . Smoking status:  Current Every Day Smoker    Packs/day: 0.25    Types: Cigarettes  . Smokeless tobacco: Never Used  Substance Use Topics  . Alcohol use: No    Alcohol/week: 0.0 standard drinks  . Drug use: No     Allergies   Hydrocodone-acetaminophen and Latex   Review of Systems Review of Systems  Constitutional: Negative for chills and fever.  HENT: Negative for facial swelling and sore throat.   Respiratory: Negative for shortness of breath.   Cardiovascular: Negative for chest pain.  Gastrointestinal: Positive for abdominal pain (lower, pelvic). Negative for nausea and vomiting.  Genitourinary: Negative for dysuria, flank pain and  frequency.  Musculoskeletal: Negative for back pain.  Skin: Negative for rash and wound.  Neurological: Negative for headaches.  Psychiatric/Behavioral: The patient is not nervous/anxious.      Physical Exam Updated Vital Signs BP 123/85 (BP Location: Left Arm)   Pulse 87   Temp 98.7 F (37.1 C) (Oral)   Resp 18   Ht 5\' 2"  (1.575 m)   Wt 75.3 kg   LMP 08/31/2016   SpO2 100%   BMI 30.36 kg/m   Physical Exam Vitals signs and nursing note reviewed. Exam conducted with a chaperone present.  Constitutional:      General: He is not in acute distress.    Appearance: He is well-developed. He is not diaphoretic.  HENT:     Head: Normocephalic and atraumatic.     Mouth/Throat:     Pharynx: No oropharyngeal exudate.  Eyes:     General: No scleral icterus.       Right eye: No discharge.        Left eye: No discharge.     Conjunctiva/sclera: Conjunctivae normal.     Pupils: Pupils are equal, round, and reactive to light.  Neck:     Musculoskeletal: Normal range of motion and neck supple.     Thyroid: No thyromegaly.  Cardiovascular:     Rate and Rhythm: Normal rate and regular rhythm.     Heart sounds: Normal heart sounds. No murmur. No friction rub. No gallop.   Pulmonary:     Effort: Pulmonary effort is normal. No respiratory distress.     Breath sounds: Normal breath sounds. No stridor. No wheezing or rales.  Abdominal:     General: Bowel sounds are normal. There is no distension.     Palpations: Abdomen is soft.     Tenderness: There is no abdominal tenderness. There is no guarding or rebound.  Genitourinary:    Vagina: Vaginal discharge (white) present.     Uterus: Absent.      Adnexa:        Right: Tenderness present. No fullness.         Left: No tenderness or fullness.    Musculoskeletal:     Comments: Mild tenderness to the finger pad of the right thumb with no erythema, subtle palpation of induration deep, could be FB; FROM of the digit, no drainage or open wound   Lymphadenopathy:     Cervical: No cervical adenopathy.  Skin:    General: Skin is warm and dry.     Coloration: Skin is not pale.     Findings: No rash.  Neurological:     Mental Status: He is alert.     Coordination: Coordination normal.      ED Treatments / Results  Labs (all labs ordered are listed, but only abnormal results are displayed) Labs Reviewed  WET PREP, GENITAL -  Abnormal; Notable for the following components:      Result Value   Clue Cells Wet Prep HPF POC PRESENT (*)    WBC, Wet Prep HPF POC MODERATE (*)    All other components within normal limits  URINALYSIS, ROUTINE W REFLEX MICROSCOPIC - Abnormal; Notable for the following components:   Hgb urine dipstick SMALL (*)    All other components within normal limits  COMPREHENSIVE METABOLIC PANEL - Abnormal; Notable for the following components:   Sodium 134 (*)    Potassium 3.3 (*)    CO2 20 (*)    All other components within normal limits  CBC - Abnormal; Notable for the following components:   WBC 11.8 (*)    Hemoglobin 10.2 (*)    HCT 34.9 (*)    MCV 70.1 (*)    MCH 20.5 (*)    MCHC 29.2 (*)    RDW 19.1 (*)    Platelets 408 (*)    All other components within normal limits  URINALYSIS, MICROSCOPIC (REFLEX) - Abnormal; Notable for the following components:   Bacteria, UA FEW (*)    All other components within normal limits  LIPASE, BLOOD  GC/CHLAMYDIA PROBE AMP ([place]) NOT AT [facility]    EKG None  Radiology US Transvaginal Non-ob  Result Date: 05/18/2018 CLINICAL DATA:  Bilateral pelvic cramping. EXAM: TRANSABDOMINAL AND TRANSVAGINAL ULTRASOUND OF PELVIS DOPPLER ULTRASOUND OF OVARIES TECHNIQUE: Both transabdominal and transvaginal ultrasound examinations of the pelvis were performed. Transabdominal technique was performed for global imaging of the pelvis including uterus, ovaries, adnexal regions, and pelvic cul-de-sac. It was necessary to proceed with endovaginal exam following the  transabdominal exam to visualize the ovaries. Color and duplex Doppler ultrasound was utilized to evaluate blood flow to the ovaries. COMPARISON:  None. FINDINGS: Uterus Surgically absent. Right ovary Measurements: 3 x 2.4 x 3.3 cm = volume: 12.4 mL. 2.6 x 2 x 1.7 cm irregular thick-walled cystic right ovarian mass without septations. Left ovary Not visualized. Pulsed Doppler evaluation of the right ovary demonstrates normal low-resistance arterial and venous waveforms. Other findings No abnormal free fluid. IMPRESSION: 1. No right ovarian torsion. 2. Left ovary is not visualized. 3. Surgically absent uterus. 4. 2.6 x 2 x 1.7 cm irregular thick-walled cystic right ovarian mass of uncertain etiology. Given the irregular wall of the cyst, follow-up by ultrasound is recommended in 6 months. Gyn consultation is recommended. Electronically Signed   By: Kathreen Devoid   On: 05/18/2018 15:13   US Pelvis Complete  Result Date: 05/18/2018 CLINICAL DATA:  Bilateral pelvic cramping. EXAM: TRANSABDOMINAL AND TRANSVAGINAL ULTRASOUND OF PELVIS DOPPLER ULTRASOUND OF OVARIES TECHNIQUE: Both transabdominal and transvaginal ultrasound examinations of the pelvis were performed. Transabdominal technique was performed for global imaging of the pelvis including uterus, ovaries, adnexal regions, and pelvic cul-de-sac. It was necessary to proceed with endovaginal exam following the transabdominal exam to visualize the ovaries. Color and duplex Doppler ultrasound was utilized to evaluate blood flow to the ovaries. COMPARISON:  None. FINDINGS: Uterus Surgically absent. Right ovary Measurements: 3 x 2.4 x 3.3 cm = volume: 12.4 mL. 2.6 x 2 x 1.7 cm irregular thick-walled cystic right ovarian mass without septations. Left ovary Not visualized. Pulsed Doppler evaluation of the right ovary demonstrates normal low-resistance arterial and venous waveforms. Other findings No abnormal free fluid. IMPRESSION: 1. No right ovarian torsion. 2. Left  ovary is not visualized. 3. Surgically absent uterus. 4. 2.6 x 2 x 1.7 cm irregular thick-walled cystic right ovarian mass of  uncertain etiology. Given the irregular wall of the cyst, follow-up by ultrasound is recommended in 6 months. Gyn consultation is recommended. Electronically Signed   By: Kathreen Devoid   On: 05/18/2018 15:13   [place] Art/ven Flow Abd Pelv Doppler  Result Date: 05/18/2018 CLINICAL DATA:  Bilateral pelvic cramping. EXAM: TRANSABDOMINAL AND TRANSVAGINAL ULTRASOUND OF PELVIS DOPPLER ULTRASOUND OF OVARIES TECHNIQUE: Both transabdominal and transvaginal ultrasound examinations of the pelvis were performed. Transabdominal technique was performed for global imaging of the pelvis including uterus, ovaries, adnexal regions, and pelvic cul-de-sac. It was necessary to proceed with endovaginal exam following the transabdominal exam to visualize the ovaries. Color and duplex Doppler ultrasound was utilized to evaluate blood flow to the ovaries. COMPARISON:  None. FINDINGS: Uterus Surgically absent. Right ovary Measurements: 3 x 2.4 x 3.3 cm = volume: 12.4 mL. 2.6 x 2 x 1.7 cm irregular thick-walled cystic right ovarian mass without septations. Left ovary Not visualized. Pulsed Doppler evaluation of the right ovary demonstrates normal low-resistance arterial and venous waveforms. Other findings No abnormal free fluid. IMPRESSION: 1. No right ovarian torsion. 2. Left ovary is not visualized. 3. Surgically absent uterus. 4. 2.6 x 2 x 1.7 cm irregular thick-walled cystic right ovarian mass of uncertain etiology. Given the irregular wall of the cyst, follow-up by ultrasound is recommended in 6 months. Gyn consultation is recommended. Electronically Signed   By: Kathreen Devoid   On: 05/18/2018 15:13    Procedures Procedures (including critical care time)  Medications Ordered in ED Medications - No data to display   Initial Impression / Assessment and Plan / ED Course  I have reviewed the triage vital  signs and the nursing notes.  Pertinent labs & imaging results that were available during my care of the patient were reviewed by me and considered in my medical decision making (see chart for details).     Patient with 3 complaints. Patient found to have an irregular, thick-walled cystic right ovarian mass. Patient advised to follow up with OB/GYN for further evaluation and recommended repeat US in 6 months. I suspect this is the cause of patient's pelvic cramping. Patient also with BV. Will treat this with Metrogel, per patient request. Patient will be referred to hand surgery for further evaluation of her FB in the thumb. I offered x-ray to evaluate depth, but discussed it may be more risky to attempt this procedure in the ED considering the duration it has been in her thumb. She opts to follow up with hand surgery instead. Patient understands and agrees with plan. Return precautions discussed. Patient vitals stable throughout ED course an discharged in satisfactory condition.  Final Clinical Impressions(s) / ED Diagnoses   Final diagnoses:  Ovarian mass, right  BV (bacterial vaginosis)    ED Discharge Orders         Ordered    metroNIDAZOLE (METROGEL VAGINAL) 0.75 % vaginal gel  Daily at bedtime     05/18/18 [address], PA-C 05/19/18 1207    Tegeler, Gwenyth Allegra, MD 05/19/18 1540

## 2018-07-05 ENCOUNTER — Other Ambulatory Visit: Payer: Self-pay

## 2018-07-05 ENCOUNTER — Emergency Department (HOSPITAL_BASED_OUTPATIENT_CLINIC_OR_DEPARTMENT_OTHER)
Admission: EM | Admit: 2018-07-05 | Discharge: 2018-07-05 | Disposition: A | Discharge status: Home / Self Care | Payer: Self-pay | Attending: Emergency Medicine | Admitting: Emergency Medicine

## 2018-07-05 ENCOUNTER — Encounter (HOSPITAL_BASED_OUTPATIENT_CLINIC_OR_DEPARTMENT_OTHER): Payer: Self-pay | Admitting: Emergency Medicine

## 2018-07-05 DIAGNOSIS — F17210 Nicotine dependence, cigarettes, uncomplicated: Secondary | ICD-10-CM | POA: Insufficient documentation

## 2018-07-05 DIAGNOSIS — N898 Other specified noninflammatory disorders of vagina: Secondary | ICD-10-CM

## 2018-07-05 DIAGNOSIS — N760 Acute vaginitis: Secondary | ICD-10-CM | POA: Insufficient documentation

## 2018-07-05 DIAGNOSIS — B9689 Other specified bacterial agents as the cause of diseases classified elsewhere: Secondary | ICD-10-CM | POA: Insufficient documentation

## 2018-07-05 LAB — URINALYSIS, ROUTINE W REFLEX MICROSCOPIC
BILIRUBIN URINE: NEGATIVE
Glucose, UA: NEGATIVE mg/dL
HGB URINE DIPSTICK: NEGATIVE
KETONES UR: 15 mg/dL — AB
Leukocytes,Ua: NEGATIVE
Nitrite: NEGATIVE
Protein, ur: NEGATIVE mg/dL
Specific Gravity, Urine: 1.025 (ref 1.005–1.030)
pH: 6 (ref 5.0–8.0)

## 2018-07-05 LAB — WET PREP, GENITAL
Sperm: NONE SEEN
Trich, Wet Prep: NONE SEEN
YEAST WET PREP: NONE SEEN

## 2018-07-05 MED ORDER — FLUCONAZOLE 50 MG PO TABS
150.0000 mg | ORAL_TABLET | Freq: Once | ORAL | Status: AC
  Administered 2018-07-05: 150 mg via ORAL
  Filled 2018-07-05: qty 1

## 2018-07-05 MED ORDER — FLUCONAZOLE 150 MG PO TABS: 150.0000 mg | ORAL_TABLET | Freq: Every day | ORAL | 0 refills | Status: AC

## 2018-07-05 NOTE — Discharge Instructions (Signed)
It was my pleasure taking care of you today!   You were given a dose of Diflucan here in the emergency department tonight.  I have given you a prescription for 1 more tablet.  Take this in 3 days if your symptoms have not improved.  You did have clue cells on your swab which is a sign of bacterial vaginosis.  I would like you to follow-up with either your primary care doctor or your OB/GYN for further discussion of this as it did not seem to resolve with treatment.   Return to the emergency department for fever, abdominal pain, vomiting, new or worsening symptoms, any additional concerns.

## 2018-07-05 NOTE — ED Triage Notes (Signed)
Pt states she has a severe yeast infection for the last 2 months that is not getting better with anything that she tried at home

## 2018-07-05 NOTE — ED Provider Notes (Signed)
[facility] EMERGENCY DEPARTMENT Provider Note   CSN: [id number] Arrival date & time: 07/05/18  7408    History   Chief Complaint Chief Complaint  Patient presents with  . Vaginitis    HPI Christina Valenzuela is a 43 y.o. adult.     The history is provided by the patient and medical records. No language interpreter was used.   Christina Valenzuela is a 43 y.o. adult  with a PMH as listed below who presents to the Emergency Department complaining of vaginal itching since mid January.  Patient states she was seen mid January and diagnosed with BV.  She was given metronidazole cream which she used for 2 days.  Once she did, she started having terrible vaginal itching and a thick, white discharge which she describes as looking like cottage cheese.  This is consistent with her previous yeast infections, although she has not had a yeast infection from using the metronidazole gel in the past.  She did try over-the-counter yeast creams which did not help at all.  She denies any abdominal pain, nausea or vomiting.  No dysuria.  She was tested for gonorrhea and chlamydia mid-January which came back negative.  She states that she has not been sexually active since then.    Past Medical History:  Diagnosis Date  . Depression     Patient Active Problem List   Diagnosis Date Noted  . Acute on chronic cholecystitis s/p lap cholecystectomy 01/05/2017 01/05/2017  . Postoperative anemia from TAH 11/2016 01/05/2017  . MRSA (methicillin resistant Staphylococcus aureus) colonization 01/05/2017  . Iron deficiency anemia due to chronic blood loss 10/24/2016  . Mixed incontinence 10/24/2016  . Chronic anemia 09/08/2015  . Pica 09/08/2015  . Primary insomnia 08/22/2015  . Depression with anxiety 08/22/2015  . Alcohol abuse 08/22/2015  . Headache 08/22/2015  . BV (bacterial vaginosis) 02/16/2014  . History of uterine leiomyoma s/p TAH 11/28/2016 02/16/2014  . UTI symptoms 02/16/2014  .  Leiomyoma of uterus, unspecified 08/25/2013  . Dysmenorrhea 08/25/2013  . Vaginitis and vulvovaginitis, unspecified 08/25/2013  . 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    Past Surgical History:  Procedure Laterality Date  . ABDOMINAL HYSTERECTOMY  11/28/2016   [facility] - Dr Zigmund Daniel.  UTERINE LEIOMYOMA w/ MENORRHAGIA  . CESAREAN SECTION    . CESAREAN SECTION    . CHOLECYSTECTOMY N/A 01/05/2017   Procedure: LAPAROSCOPIC CHOLECYSTECTOMY WITH INTRAOPERATIVE CHOLANGIOGRAM;  Surgeon: Michael Boston, MD;  Location: [facility];  Service: General;  Laterality: N/A;  . EPIGASTRIC HERNIA REPAIR     with mesh.  Dr Excell Seltzer  . TUBAL LIGATION       OB History    Gravida  8   Para  4   Term  3   Preterm  1   AB  4   Living  5     SAB  2   TAB  2   Ectopic  0   Multiple  1   Live Births  5            Home Medications    Prior to Admission medications   Medication Sig Start Date End Date Taking? Authorizing Provider  fluconazole (DIFLUCAN) 150 MG tablet Take 1 tablet (150 mg total) by mouth daily for 1 dose. In 3 days if symptoms have not resolved. 07/05/18 07/06/18  Jonn Chaikin, Ozella Almond, PA-C  valACYclovir (VALTREX) 500 MG tablet Take 1 tablet (500 mg total) by mouth 2 (two) times daily. Patient taking differently: Take 500  mg by mouth daily as needed (outbreak).  09/04/16   Shelly Bombard, MD    Family History Family History  Problem Relation Age of Onset  . Diabetes Maternal Grandfather     Social History Social History   Tobacco Use  . Smoking status: Current Every Day Smoker    Packs/day: 0.25    Types: Cigarettes  . Smokeless tobacco: Never Used  Substance Use Topics  . Alcohol use: No    Alcohol/week: 0.0 standard drinks  . Drug use: No     Allergies   Hydrocodone-acetaminophen and Latex   Review of Systems Review of Systems  Constitutional: Negative for fever.  Gastrointestinal: Negative for abdominal pain, diarrhea, nausea and vomiting.  Genitourinary:  Negative for difficulty urinating, dysuria, flank pain, frequency and urgency.       + vaginal discharge  Musculoskeletal: Negative for back pain.     Physical Exam Updated Vital Signs BP (!) 134/97 (BP Location: Left Arm)   Pulse 90   Temp 98.2 F (36.8 C) (Oral)   Resp 18   Ht 5\' 2"  (1.575 m)   Wt 74.8 kg   LMP 08/31/2016   SpO2 100%   BMI 30.18 kg/m   Physical Exam Vitals signs and nursing note reviewed.  Constitutional:      General: He is not in acute distress.    Appearance: He is well-developed.  HENT:     Head: Normocephalic and atraumatic.  Neck:     Musculoskeletal: Neck supple.  Cardiovascular:     Rate and Rhythm: Normal rate and regular rhythm.     Heart sounds: Normal heart sounds. No murmur.  Pulmonary:     Effort: Pulmonary effort is normal. No respiratory distress.     Breath sounds: Normal breath sounds.  Abdominal:     General: There is no distension.     Palpations: Abdomen is soft.     Tenderness: There is no abdominal tenderness.  Genitourinary:    Comments: Chaperone present for exam. Thick, white discharge. No tenderness. No bleeding within vaginal vault. Skin:    General: Skin is warm and dry.  Neurological:     Mental Status: He is alert and oriented to person, place, and time.      ED Treatments / Results  Labs (all labs ordered are listed, but only abnormal results are displayed) Labs Reviewed  WET PREP, GENITAL - Abnormal; Notable for the following components:      Result Value   Clue Cells Wet Prep HPF POC PRESENT (*)    WBC, Wet Prep HPF POC MODERATE (*)    All other components within normal limits  URINALYSIS, ROUTINE W REFLEX MICROSCOPIC - Abnormal; Notable for the following components:   Ketones, ur 15 (*)    All other components within normal limits    EKG None  Radiology No results found.  Procedures Procedures (including critical care time)  Medications Ordered in ED Medications  fluconazole (DIFLUCAN)  tablet 150 mg (150 mg Oral Given 07/05/18 2124)     Initial Impression / Assessment and Plan / ED Course  I have reviewed the triage vital signs and the nursing notes.  Pertinent labs & imaging results that were available during my care of the patient were reviewed by me and considered in my medical decision making (see chart for details).       Christina Valenzuela is a 43 y.o. adult who presents to ED for vaginal itching and thick white discharge consistent with  her typical yeast infections.  She denies any urinary symptoms. Tried over-the-counter creams with no improvement.  On exam, she is afebrile, hemodynamically stable with no abdominal tenderness.  She has no tenderness on GU exam, but does have a thick white discharge clinically consistent with yeast.  Wet prep does not show yeast oddly enough, does show persistent clue cells.  Given her history and exam, will go ahead and treat for yeast.  She quit taking the Flagyl gel, but reports that she has plenty of it at home and can restart this for BV.  Shortly encouraged her to follow-up with PCP or OB/GYN for further evaluation of this.  She reports that she has an appointment with a new OB/GYN on March 25.  Encouraged her to keep this appointment.  Reasons to return to the emergency department were discussed and all questions were answered.  Final Clinical Impressions(s) / ED Diagnoses   Final diagnoses:  Vaginal itching  BV (bacterial vaginosis)    ED Discharge Orders         Ordered    fluconazole (DIFLUCAN) 150 MG tablet  Daily     07/05/18 2117           Lanise Mergen, Ozella Almond, PA-C 07/05/18 2209    Veryl Speak, MD 07/05/18 2328

## 2018-07-16 ENCOUNTER — Emergency Department (HOSPITAL_BASED_OUTPATIENT_CLINIC_OR_DEPARTMENT_OTHER)
Admission: EM | Admit: 2018-07-16 | Discharge: 2018-07-16 | Disposition: A | Discharge status: Home / Self Care | Payer: Self-pay | Attending: Emergency Medicine | Admitting: Emergency Medicine

## 2018-07-16 ENCOUNTER — Other Ambulatory Visit: Payer: Self-pay

## 2018-07-16 ENCOUNTER — Encounter (HOSPITAL_BASED_OUTPATIENT_CLINIC_OR_DEPARTMENT_OTHER): Payer: Self-pay

## 2018-07-16 DIAGNOSIS — Z91040 Latex allergy status: Secondary | ICD-10-CM | POA: Insufficient documentation

## 2018-07-16 DIAGNOSIS — F17210 Nicotine dependence, cigarettes, uncomplicated: Secondary | ICD-10-CM | POA: Insufficient documentation

## 2018-07-16 DIAGNOSIS — N760 Acute vaginitis: Secondary | ICD-10-CM | POA: Insufficient documentation

## 2018-07-16 DIAGNOSIS — A6004 Herpesviral vulvovaginitis: Secondary | ICD-10-CM

## 2018-07-16 LAB — WET PREP, GENITAL
Clue Cells Wet Prep HPF POC: NONE SEEN
Sperm: NONE SEEN
Trich, Wet Prep: NONE SEEN
Yeast Wet Prep HPF POC: NONE SEEN

## 2018-07-16 LAB — URINALYSIS, ROUTINE W REFLEX MICROSCOPIC
Bilirubin Urine: NEGATIVE
GLUCOSE, UA: NEGATIVE mg/dL
Ketones, ur: NEGATIVE mg/dL
Leukocytes,Ua: NEGATIVE
Nitrite: NEGATIVE
PROTEIN: NEGATIVE mg/dL
Specific Gravity, Urine: 1.025 (ref 1.005–1.030)
pH: 6.5 (ref 5.0–8.0)

## 2018-07-16 LAB — URINALYSIS, MICROSCOPIC (REFLEX)

## 2018-07-16 MED ORDER — VALACYCLOVIR HCL 500 MG PO TABS: 500.0000 mg | ORAL_TABLET | Freq: Two times a day (BID) | ORAL | 99 refills | Status: AC

## 2018-07-16 NOTE — ED Provider Notes (Signed)
[facility] [facility] EMERGENCY DEPARTMENT Provider Note   CSN: [id number] Arrival date & time: 07/16/18  1654    History   Chief Complaint Chief Complaint  Patient presents with  . Vaginal Itching    HPI Christina Valenzuela is a 43 y.o. adult.     Patient is a 43 year old female who presents with vaginal discharge.  She has had a history of recurrent yeast infections as well as bacterial vaginosis.  She was seen here 2 weeks ago with vaginal discharge and treated as a yeast infection.  She states her symptoms at that point were consistent with a yeast infection.  She was treated with fluconazole.  She is continued to have vaginal discharge and now some irritation in her introitus.  She has not noticed any lesions.  She does have a history of herpes but has not noticed any lesions in her typical outbreak areas.  No abdominal pain.  No fevers.  No nausea or vomiting.  She is not currently sexually active.     Past Medical History:  Diagnosis Date  . Depression     Patient Active Problem List   Diagnosis Date Noted  . Acute on chronic cholecystitis s/p lap cholecystectomy 01/05/2017 01/05/2017  . Postoperative anemia from TAH 11/2016 01/05/2017  . MRSA (methicillin resistant Staphylococcus aureus) colonization 01/05/2017  . Iron deficiency anemia due to chronic blood loss 10/24/2016  . Mixed incontinence 10/24/2016  . Chronic anemia 09/08/2015  . Pica 09/08/2015  . Primary insomnia 08/22/2015  . Depression with anxiety 08/22/2015  . Alcohol abuse 08/22/2015  . Headache 08/22/2015  . BV (bacterial vaginosis) 02/16/2014  . History of uterine leiomyoma s/p TAH 11/28/2016 02/16/2014  . UTI symptoms 02/16/2014  . Leiomyoma of uterus, unspecified 08/25/2013  . Dysmenorrhea 08/25/2013  . Vaginitis and vulvovaginitis, unspecified 08/25/2013  . 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    Past Surgical History:  Procedure Laterality Date  . ABDOMINAL HYSTERECTOMY  11/28/2016   [facility] -  Dr Zigmund Daniel.  UTERINE LEIOMYOMA w/ MENORRHAGIA  . CESAREAN SECTION    . CESAREAN SECTION    . CHOLECYSTECTOMY N/A 01/05/2017   Procedure: LAPAROSCOPIC CHOLECYSTECTOMY WITH INTRAOPERATIVE CHOLANGIOGRAM;  Surgeon: Michael Boston, MD;  Location: [facility];  Service: General;  Laterality: N/A;  . EPIGASTRIC HERNIA REPAIR     with mesh.  Dr Excell Seltzer  . TUBAL LIGATION       OB History    Gravida  8   Para  4   Term  3   Preterm  1   AB  4   Living  5     SAB  2   TAB  2   Ectopic  0   Multiple  1   Live Births  5            Home Medications    Prior to Admission medications   Medication Sig Start Date End Date Taking? Authorizing Provider  valACYclovir (VALTREX) 500 MG tablet Take 1 tablet (500 mg total) by mouth 2 (two) times daily for 3 days. 07/16/18 07/19/18  Malvin Johns, MD    Family History Family History  Problem Relation Age of Onset  . Diabetes Maternal Grandfather     Social History Social History   Tobacco Use  . Smoking status: Current Every Day Smoker    Packs/day: 0.25    Types: Cigarettes  . Smokeless tobacco: Never Used  Substance Use Topics  . Alcohol use: No    Alcohol/week: 0.0 standard drinks  .  Drug use: No     Allergies   Hydrocodone-acetaminophen and Latex   Review of Systems Review of Systems  Constitutional: Negative for chills, diaphoresis, fatigue and fever.  HENT: Negative for congestion, rhinorrhea and sneezing.   Eyes: Negative.   Respiratory: Negative for cough, chest tightness and shortness of breath.   Cardiovascular: Negative for chest pain and leg swelling.  Gastrointestinal: Negative for abdominal pain, blood in stool, diarrhea, nausea and vomiting.  Genitourinary: Negative for difficulty urinating, flank pain, frequency and hematuria.       Positive vaginal discharge, no bleeding  Musculoskeletal: Negative for arthralgias and back pain.  Skin: Negative for rash.  Neurological: Negative for dizziness, speech  difficulty, weakness, numbness and headaches.     Physical Exam Updated Vital Signs BP (!) 133/95 (BP Location: Right Arm)   Pulse 92   Temp 98.2 F (36.8 C) (Oral)   Resp 20   Ht 5\' 2"  (1.575 m)   Wt 78.6 kg   LMP 08/31/2016   SpO2 100%   BMI 31.68 kg/m   Physical Exam Constitutional:      Appearance: He is well-developed.  HENT:     Head: Normocephalic and atraumatic.  Eyes:     Pupils: Pupils are equal, round, and reactive to light.  Neck:     Musculoskeletal: Normal range of motion and neck supple.  Cardiovascular:     Rate and Rhythm: Normal rate and regular rhythm.     Heart sounds: Normal heart sounds.  Pulmonary:     Effort: Pulmonary effort is normal. No respiratory distress.     Breath sounds: Normal breath sounds. No wheezing or rales.  Chest:     Chest wall: No tenderness.  Abdominal:     General: Bowel sounds are normal.     Palpations: Abdomen is soft.     Tenderness: There is no abdominal tenderness. There is no guarding or rebound.  Genitourinary:    Comments: Positive moderate amount of white discharge.  No midline or adnexal tenderness.  No blisters or lesions are noted. Musculoskeletal: Normal range of motion.  Lymphadenopathy:     Cervical: No cervical adenopathy.  Skin:    General: Skin is warm and dry.     Findings: No rash.  Neurological:     Mental Status: He is alert and oriented to person, place, and time.      ED Treatments / Results  Labs (all labs ordered are listed, but only abnormal results are displayed) Labs Reviewed  WET PREP, GENITAL - Abnormal; Notable for the following components:      Result Value   WBC, Wet Prep HPF POC FEW (*)    All other components within normal limits  URINALYSIS, ROUTINE W REFLEX MICROSCOPIC - Abnormal; Notable for the following components:   Hgb urine dipstick TRACE (*)    All other components within normal limits  URINALYSIS, MICROSCOPIC (REFLEX) - Abnormal; Notable for the following  components:   Bacteria, UA FEW (*)    All other components within normal limits  GC/CHLAMYDIA PROBE AMP ([place]) NOT AT [facility]    EKG None  Radiology No results found.  Procedures Procedures (including critical care time)  Medications Ordered in ED Medications - No data to display   Initial Impression / Assessment and Plan / ED Course  I have reviewed the triage vital signs and the nursing notes.  Pertinent labs & imaging results that were available during my care of the patient were reviewed by me  and considered in my medical decision making (see chart for details).        Patient is a 43 year old female who presents with vaginal irritation.  She has some mild discharge on exam that she does have a lot of tenderness at the introitus.  Her wet prep is negative for BV or yeast infection.  I do not see any obvious herpetic lesions but it is very tender and feels similar to prior herpetic outbreaks.  Will treat with Valtrex and she has a follow-up appointment at the [facility] outpatient clinic on the 25th of this month.  Return precautions were given.  Final Clinical Impressions(s) / ED Diagnoses   Final diagnoses:  Acute vaginitis    ED Discharge Orders         Ordered    valACYclovir (VALTREX) 500 MG tablet  2 times daily     07/16/18 2045           Malvin Johns, MD 07/16/18 2046

## 2018-07-16 NOTE — ED Notes (Signed)
Spoke to lab about UA. Stated running urine now results to follow.

## 2018-07-16 NOTE — ED Notes (Signed)
Pt understood dc material. NAD noted. Script given at dc. All questions answered to satisfaction. Pt escorted to check out window. 

## 2018-07-16 NOTE — ED Triage Notes (Signed)
Pt c/o cont'd vaginal irritation-seen here last week for same per pt-NAD-steady gait

## 2018-07-19 LAB — GC/CHLAMYDIA PROBE AMP (~~LOC~~) NOT AT ARMC
CHLAMYDIA, DNA PROBE: NEGATIVE
Neisseria Gonorrhea: NEGATIVE

## 2018-07-28 ENCOUNTER — Telehealth: Payer: Self-pay | Admitting: Family Medicine

## 2018-07-28 ENCOUNTER — Other Ambulatory Visit: Payer: Self-pay

## 2018-07-28 ENCOUNTER — Ambulatory Visit (INDEPENDENT_AMBULATORY_CARE_PROVIDER_SITE_OTHER): Payer: Self-pay | Admitting: Family Medicine

## 2018-07-28 DIAGNOSIS — R102 Pelvic and perineal pain: Secondary | ICD-10-CM

## 2018-07-28 NOTE — Telephone Encounter (Signed)
Attempted to call patient to let her know that her visit will be over the phone today. No answer, lvm with this information and to give the office a call if she has any questions.

## 2018-07-28 NOTE — Progress Notes (Signed)
TELEHEALTH VIRTUAL GYNECOLOGY VISIT ENCOUNTER NOTE  I connected with Christina Valenzuela on 07/28/18 at  1:55 PM EDT by telephone at home and verified that I am speaking with the correct person using two identifiers.   I discussed the limitations, risks, security and privacy concerns of performing an evaluation and management service by telephone and the availability of in person appointments. I also discussed with the patient that there may be a patient responsible charge related to this service. The patient expressed understanding and agreed to proceed. Had a yeast infection.   History:  Christina Valenzuela is a 43 y.o. [phone] female being evaluated today for pelvic pain. Seen in ED with with ovarian cyst noted. Moved to virtual visit, due to ovarian cyst f/u only in 4 months required. Reports crampy pain x 2-3 months. S/p hysterectomy in 2018 by [place] [facility] denies having any urinary work done. Op note reviewed. Notes pain in midline and low down. No dysuria, no fever, chills, no n/V.Done Takes ibuprofen which improves it. Had heavy cycles and fibroids. Had TVH. Had painful cycles. She denies any abnormal vaginal discharge, bleeding, pelvic pain or other concerns.       Past Medical History:  Diagnosis Date  . Depression    Past Surgical History:  Procedure Laterality Date  . ABDOMINAL HYSTERECTOMY  11/28/2016   [facility] - Dr Zigmund Daniel.  UTERINE LEIOMYOMA w/ MENORRHAGIA  . CESAREAN SECTION    . CESAREAN SECTION    . CHOLECYSTECTOMY N/A 01/05/2017   Procedure: LAPAROSCOPIC CHOLECYSTECTOMY WITH INTRAOPERATIVE CHOLANGIOGRAM;  Surgeon: Michael Boston, MD;  Location: [facility];  Service: General;  Laterality: N/A;  . EPIGASTRIC HERNIA REPAIR     with mesh.  Dr Excell Seltzer  . TUBAL LIGATION     The following portions of the patient's history were reviewed and updated as appropriate: allergies, current medications, past family history, past medical history, past social history, past surgical history  and problem list.   Health Maintenance:  Normal pap and negative HRHPV on 09/04/2016.    Review of Systems:  Pertinent items noted in HPI and remainder of comprehensive ROS otherwise negative.  Physical Exam:  Physical exam deferred due to nature of the encounter  Labs and Imaging Results for orders placed or performed during the hospital encounter of 07/16/18 (from the past 336 hour(s))  GC/Chlamydia probe amp   Collection Time: 07/16/18 12:00 AM  Result Value Ref Range   Chlamydia Negative    Neisseria gonorrhea Negative   Urinalysis, Routine w reflex microscopic   Collection Time: 07/16/18  5:20 PM  Result Value Ref Range   Color, Urine YELLOW YELLOW   APPearance CLEAR CLEAR   Specific Gravity, Urine 1.025 1.005 - 1.030   pH 6.5 5.0 - 8.0   Glucose, UA NEGATIVE NEGATIVE mg/dL   Hgb urine dipstick TRACE (A) NEGATIVE   Bilirubin Urine NEGATIVE NEGATIVE   Ketones, ur NEGATIVE NEGATIVE mg/dL   Protein, ur NEGATIVE NEGATIVE mg/dL   Nitrite NEGATIVE NEGATIVE   Leukocytes,Ua NEGATIVE NEGATIVE  Urinalysis, Microscopic (reflex)   Collection Time: 07/16/18  5:20 PM  Result Value Ref Range   RBC / HPF 0-5 0 - 5 RBC/hpf   WBC, UA 0-5 0 - 5 WBC/hpf   Bacteria, UA FEW (A) NONE SEEN   Squamous Epithelial / LPF 6-10 0 - 5  Wet prep, genital   Collection Time: 07/16/18  7:50 PM  Result Value Ref Range   Yeast Wet Prep HPF POC NONE SEEN NONE SEEN  Trich, Wet Prep NONE SEEN NONE SEEN   Clue Cells Wet Prep HPF POC NONE SEEN NONE SEEN   WBC, Wet Prep HPF POC FEW (A) NONE SEEN   Sperm NONE SEEN     Assessment and Plan:     Problem List Items Addressed This Visit      Unprioritized   Pelvic pain    ? Related to ovarian, cyst, which needs f/u u/s in 4 months. Should rule out any prolapse with exam              I discussed the assessment and treatment plan with the patient. The patient was provided an opportunity to ask questions and all were answered. The patient agreed with  the plan and demonstrated an understanding of the instructions.   The patient was advised to call back or seek an in-person evaluation/go to the ED if the symptoms worsen or if the condition fails to improve as anticipated.  I provided 12:22 minutes of non-face-to-face time during this encounter.  Return in about 4 weeks (around 08/25/2018) for in person due to need exam.  Donnamae Jude, [place] for [facility] [place]

## 2018-07-29 ENCOUNTER — Telehealth: Payer: Self-pay | Admitting: *Deleted

## 2018-07-29 ENCOUNTER — Encounter: Payer: Self-pay | Admitting: Family Medicine

## 2018-07-29 DIAGNOSIS — R102 Pelvic and perineal pain: Secondary | ICD-10-CM | POA: Insufficient documentation

## 2018-07-29 MED ORDER — DICLOFENAC SODIUM 75 MG PO TBEC: 75.0000 mg | DELAYED_RELEASE_TABLET | Freq: Two times a day (BID) | ORAL | 0 refills | Status: DC

## 2018-07-29 NOTE — Assessment & Plan Note (Signed)
?   Related to ovarian, cyst, which needs f/u u/s in 4 months. Should rule out any prolapse with exam

## 2018-07-29 NOTE — Telephone Encounter (Signed)
-----   Message from Donnamae Jude, MD sent at 07/29/2018  4:09 PM EDT ----- Regarding: RE: pain med Trial of Diclofenac 75 mg po bid # 60, take with food as needed. ----- Message ----- From: Samuel Germany, RN Sent: 07/29/2018   2:17 PM EDT To: Donnamae Jude, MD Subject: pain med                                       This patient sent Mychart message that when she saw you yesterday you talked about ordering her pain medicine, ; but I do not see any orders. She said you all got on another topic. Is there something you were planing to send in? Vaughan Basta,

## 2018-07-29 NOTE — Telephone Encounter (Signed)
I called Christina Valenzuela and notified her of new rx Diclofenac and that I will send to pharmacy of her choice which I confirmed, Advised to not take ibuprofen while taking this ; also to take with food if needed to avoid gi distress. I advised her to let us know if this does not help. She voices understanding.

## 2018-08-18 ENCOUNTER — Ambulatory Visit: Payer: Self-pay | Admitting: Family Medicine

## 2018-09-05 ENCOUNTER — Other Ambulatory Visit: Payer: Self-pay | Admitting: Family Medicine

## 2018-09-05 DIAGNOSIS — R102 Pelvic and perineal pain unspecified side: Secondary | ICD-10-CM

## 2018-10-17 ENCOUNTER — Other Ambulatory Visit: Payer: Self-pay | Admitting: Family Medicine

## 2018-10-17 DIAGNOSIS — R102 Pelvic and perineal pain: Secondary | ICD-10-CM

## 2018-10-18 DIAGNOSIS — B379 Candidiasis, unspecified: Secondary | ICD-10-CM

## 2018-10-18 MED ORDER — FLUCONAZOLE 150 MG PO TABS: 150.0000 mg | ORAL_TABLET | Freq: Once | ORAL | 0 refills | Status: AC

## 2018-11-09 ENCOUNTER — Other Ambulatory Visit: Payer: Self-pay | Admitting: Family Medicine

## 2018-11-09 DIAGNOSIS — R102 Pelvic and perineal pain: Secondary | ICD-10-CM

## 2018-12-01 ENCOUNTER — Emergency Department (HOSPITAL_BASED_OUTPATIENT_CLINIC_OR_DEPARTMENT_OTHER): Payer: Self-pay

## 2018-12-01 ENCOUNTER — Encounter (HOSPITAL_BASED_OUTPATIENT_CLINIC_OR_DEPARTMENT_OTHER): Payer: Self-pay

## 2018-12-01 ENCOUNTER — Emergency Department (HOSPITAL_BASED_OUTPATIENT_CLINIC_OR_DEPARTMENT_OTHER)
Admission: EM | Admit: 2018-12-01 | Discharge: 2018-12-01 | Disposition: A | Discharge status: Home / Self Care | Payer: Self-pay | Attending: Emergency Medicine | Admitting: Emergency Medicine

## 2018-12-01 ENCOUNTER — Other Ambulatory Visit: Payer: Self-pay

## 2018-12-01 DIAGNOSIS — Z202 Contact with and (suspected) exposure to infections with a predominantly sexual mode of transmission: Secondary | ICD-10-CM | POA: Insufficient documentation

## 2018-12-01 DIAGNOSIS — Z79899 Other long term (current) drug therapy: Secondary | ICD-10-CM | POA: Insufficient documentation

## 2018-12-01 DIAGNOSIS — Z20828 Contact with and (suspected) exposure to other viral communicable diseases: Secondary | ICD-10-CM | POA: Insufficient documentation

## 2018-12-01 DIAGNOSIS — R079 Chest pain, unspecified: Secondary | ICD-10-CM | POA: Insufficient documentation

## 2018-12-01 DIAGNOSIS — F17210 Nicotine dependence, cigarettes, uncomplicated: Secondary | ICD-10-CM | POA: Insufficient documentation

## 2018-12-01 LAB — COMPREHENSIVE METABOLIC PANEL
ALT: 15 U/L (ref 0–44)
AST: 21 U/L (ref 15–41)
Albumin: 3.6 g/dL (ref 3.5–5.0)
Alkaline Phosphatase: 53 U/L (ref 38–126)
Anion gap: 8 (ref 5–15)
BUN: 8 mg/dL (ref 6–20)
CO2: 25 mmol/L (ref 22–32)
Calcium: 9.3 mg/dL (ref 8.9–10.3)
Chloride: 102 mmol/L (ref 98–111)
Creatinine, Ser: 0.74 mg/dL (ref 0.44–1.00)
GFR calc Af Amer: >60 mL/min (ref >60)
GFR calc non Af Amer: >60 mL/min (ref >60)
Glucose, Bld: 90 mg/dL (ref 70–99)
Potassium: 4.3 mmol/L (ref 3.5–5.1)
Sodium: 135 mmol/L (ref 135–145)
Total Bilirubin: 0.3 mg/dL (ref 0.3–1.2)
Total Protein: 6.9 g/dL (ref 6.5–8.1)

## 2018-12-01 LAB — URINALYSIS, ROUTINE W REFLEX MICROSCOPIC
Bilirubin Urine: NEGATIVE
Glucose, UA: NEGATIVE mg/dL
Hgb urine dipstick: NEGATIVE
Ketones, ur: NEGATIVE mg/dL
Leukocytes,Ua: NEGATIVE
Nitrite: NEGATIVE
Protein, ur: NEGATIVE mg/dL
Specific Gravity, Urine: 1.02 (ref 1.005–1.030)
pH: 7.5 (ref 5.0–8.0)

## 2018-12-01 LAB — CBC
HCT: 38.2 % (ref 36.0–46.0)
Hemoglobin: 11.6 g/dL — ABNORMAL LOW (ref 12.0–15.0)
MCH: 22.9 pg — ABNORMAL LOW (ref 26.0–34.0)
MCHC: 30.4 g/dL (ref 30.0–36.0)
MCV: 75.5 fL — ABNORMAL LOW (ref 80.0–100.0)
Platelets: 346 10*3/uL (ref 150–400)
RBC: 5.06 MIL/uL (ref 3.87–5.11)
RDW: 17.5 % — ABNORMAL HIGH (ref 11.5–15.5)
WBC: 10.5 10*3/uL (ref 4.0–10.5)
nRBC: 0 % (ref 0.0–0.2)

## 2018-12-01 LAB — WET PREP, GENITAL
Clue Cells Wet Prep HPF POC: NONE SEEN
Sperm: NONE SEEN
Trich, Wet Prep: NONE SEEN
WBC, Wet Prep HPF POC: NONE SEEN
Yeast Wet Prep HPF POC: NONE SEEN

## 2018-12-01 LAB — SARS CORONAVIRUS 2 BY RT PCR (HOSPITAL ORDER, PERFORMED IN ~~LOC~~ HOSPITAL LAB): SARS Coronavirus 2: NEGATIVE

## 2018-12-01 LAB — LIPASE, BLOOD: Lipase: 24 U/L (ref 11–51)

## 2018-12-01 MED ORDER — CEFTRIAXONE SODIUM 250 MG IJ SOLR
250.0000 mg | Freq: Once | INTRAMUSCULAR | Status: AC
  Administered 2018-12-01: 250 mg via INTRAMUSCULAR
  Filled 2018-12-01: qty 250

## 2018-12-01 MED ORDER — FLUCONAZOLE 150 MG PO TABS: 150.0000 mg | ORAL_TABLET | Freq: Once | ORAL | 0 refills | Status: DC

## 2018-12-01 MED ORDER — AZITHROMYCIN 250 MG PO TABS
1000.0000 mg | ORAL_TABLET | Freq: Once | ORAL | Status: AC
  Administered 2018-12-01: 1000 mg via ORAL
  Filled 2018-12-01: qty 4

## 2018-12-01 MED ORDER — FLUCONAZOLE 150 MG PO TABS: 150.0000 mg | ORAL_TABLET | Freq: Once | ORAL | 0 refills | Status: AC

## 2018-12-01 NOTE — ED Triage Notes (Signed)
Pt c/o CP x 3 days-pt also c/o abd pain and requests to be checked for STD due to possible exposure-NAD-steady gait

## 2018-12-01 NOTE — ED Provider Notes (Signed)
[place] EMERGENCY DEPARTMENT Provider Note   CSN: [id number] Arrival date & time: 12/01/18  1412    History   Chief Complaint Chief Complaint  Patient presents with   Chest Pain   Exposure to STD    HPI Christina Valenzuela is a 43 y.o. female.     HPI Patient presents with a few different complaints.  Has some dull chest pain.  Some mild shortness of breath..  No fevers.  No cough.  No known covid exposures.  Has had for the last few days.  Thinks that could be some anxiety with it.  States she just found out that her husband is using drugs and having unprotected sex with someone else besides her.  Also complaining of some upper abdominal pain.  States that was couple days ago.  States it felt like the pain she had for her gallbladder.  Pain-free now.  No nausea or vomiting.  No fevers.  Also some lower abdominal/pelvic pain.  No dysuria.  It is dull.  No vaginal bleeding or discharge.  She is worried she could have an STD from her husband.  States that she is not had sex with him in the last month however. Past Medical History:  Diagnosis Date   Depression     Patient Active Problem List   Diagnosis Date Noted   Pelvic pain 07/29/2018   Acute on chronic cholecystitis s/p lap cholecystectomy 01/05/2017 01/05/2017   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  UTERINE LEIOMYOMA w/ MENORRHAGIA   CESAREAN SECTION     CESAREAN SECTION     CHOLECYSTECTOMY N/A 01/05/2017   Procedure: LAPAROSCOPIC CHOLECYSTECTOMY WITH INTRAOPERATIVE CHOLANGIOGRAM;   Surgeon: Michael Boston, MD;  Location: [facility];  Service: General;  Laterality: N/A;   EPIGASTRIC HERNIA REPAIR     with mesh.  Dr Excell Seltzer   TUBAL LIGATION       OB History    Gravida  8   Para  4   Term  3   Preterm  1   AB  4   Living  5     SAB  2   TAB  2   Ectopic  0   Multiple  1   Live Births  5            Home Medications    Prior to Admission medications   Medication Sig Start Date End Date Taking? Authorizing Provider  diclofenac (VOLTAREN) 75 MG EC tablet TAKE 1 TABLET (75 MG TOTAL) BY MOUTH 2 (TWO) TIMES DAILY WITH A MEAL. 11/09/18   Donnamae Jude, MD  fluconazole (DIFLUCAN) 150 MG tablet Take 1 tablet (150 mg total) by mouth once for 1 dose. 12/01/18 12/01/18  Davonna Belling, MD  ibuprofen (ADVIL,MOTRIN) 800 MG tablet Take by mouth. 09/04/16   [provider]    Family History Family History  Problem Relation Age of Onset   Diabetes Maternal Grandfather     Social History Social History   Tobacco Use   Smoking status: Current Every Day Smoker    Packs/day: 0.25    Types:  Cigarettes   Smokeless tobacco: Never Used  Substance Use Topics   Alcohol use: No    Alcohol/week: 0.0 standard drinks   Drug use: No     Allergies   Hydrocodone-acetaminophen and Latex   Review of Systems Review of Systems  Constitutional: Positive for fatigue. Negative for appetite change and fever.  HENT: Negative for congestion.   Respiratory: Positive for shortness of breath.   Cardiovascular: Positive for chest pain.  Gastrointestinal: Positive for abdominal pain. Negative for diarrhea, nausea and vomiting.  Genitourinary: Positive for pelvic pain. Negative for dysuria.  Musculoskeletal: Negative for back pain.  Skin: Negative for rash.  Neurological: Negative for weakness.  Psychiatric/Behavioral: Negative for confusion.     Physical Exam Updated Vital Signs BP 119/90 (BP Location: Right Arm)    Pulse 96    Temp 98.5 F (36.9 C)  (Oral)    Resp 18    Ht 5\' 2"  (1.575 m)    Wt 75.3 kg    LMP 08/31/2016    SpO2 100%    BMI 30.36 kg/m   Physical Exam Vitals signs and nursing note reviewed.  HENT:     Head: Atraumatic.  Neck:     Musculoskeletal: Neck supple.  Cardiovascular:     Rate and Rhythm: Normal rate and regular rhythm.  Pulmonary:     Effort: Pulmonary effort is normal.     Breath sounds: No wheezing, rhonchi or rales.  Abdominal:     Tenderness: There is no abdominal tenderness.  Musculoskeletal:     Right lower leg: No edema.     Left lower leg: No edema.  Skin:    General: Skin is warm.     Capillary Refill: Capillary refill takes less than 2 seconds.  Neurological:     General: No focal deficit present.     Mental Status: She is alert.      ED Treatments / Results  Labs (all labs ordered are listed, but only abnormal results are displayed) Labs Reviewed  CBC - Abnormal; Notable for the following components:      Result Value   Hemoglobin 11.6 (*)    MCV 75.5 (*)    MCH 22.9 (*)    RDW 17.5 (*)    All other components within normal limits  URINALYSIS, ROUTINE W REFLEX MICROSCOPIC - Abnormal; Notable for the following components:   APPearance CLOUDY (*)    All other components within normal limits  SARS CORONAVIRUS 2 (HOSPITAL ORDER, [facility])  WET PREP, GENITAL  COMPREHENSIVE METABOLIC PANEL  LIPASE, BLOOD  RPR  HIV ANTIBODY (ROUTINE TESTING W REFLEX)  GC/CHLAMYDIA PROBE AMP ([place]) NOT AT [facility]    EKG EKG Interpretation  Date/Time:  Wednesday December 01 2018 14:26:44 EDT Ventricular Rate:  92 PR Interval:    QRS Duration: 87 QT Interval:  352 QTC Calculation: 436 R Axis:   -29 Text Interpretation:  Sinus rhythm Prolonged PR interval Confirmed by Veryl Speak [phone]) on 12/01/2018 2:59:20 PM Also confirmed by Davonna Belling [phone]  on 12/01/2018 3:11:11 PM   Radiology Dg Chest Portable 1 View  Result Date: 12/01/2018 CLINICAL DATA:   Chest pain for 3 days EXAM: PORTABLE CHEST 1 VIEW COMPARISON:  December 30, 2017 FINDINGS: The lungs are clear without focal consolidation, edema, effusion or pneumothorax. Cardiomediastinal silhouette is within normal limits for size. No acute osseous findings. IMPRESSION: No acute cardiopulmonary process. Electronically Signed   By: Prudencio Pair M.D.   On:  12/01/2018 16:12    Procedures Procedures (including critical care time)  Medications Ordered in ED Medications  cefTRIAXone (ROCEPHIN) injection 250 mg (250 mg Intramuscular Given 12/01/18 1539)  azithromycin (ZITHROMAX) tablet 1,000 mg (1,000 mg Oral Given 12/01/18 1539)     Initial Impression / Assessment and Plan / ED Course  I have reviewed the triage vital signs and the nursing notes.  Pertinent labs & imaging results that were available during my care of the patient were reviewed by me and considered in my medical decision making (see chart for details).       Patient with exposure to her husband reportedly been having unprotected sex with someone else besides her.  Will empirically treat for STDs although benign wet prep.  STD testing done.  Also dull chest pain.  X-ray reassuring.  Doubt cardiac cause.  EKG reassuring.  I think she is low risk did not need troponin.  Discharge home with outpatient follow-up.  Patient requested Diflucan due to history of yeast infections after antibiotics. Final Clinical Impressions(s) / ED Diagnoses   Final diagnoses:  Nonspecific chest pain  Possible exposure to STD    ED Discharge Orders         Ordered    fluconazole (DIFLUCAN) 150 MG tablet   Once,   Status:  Discontinued     12/01/18 1856    fluconazole (DIFLUCAN) 150 MG tablet   Once     12/01/18 1856           Davonna Belling, MD 12/01/18 1858

## 2018-12-02 LAB — HIV ANTIBODY (ROUTINE TESTING W REFLEX): HIV Screen 4th Generation wRfx: NONREACTIVE

## 2018-12-02 LAB — RPR: RPR Ser Ql: NONREACTIVE

## 2018-12-02 MED FILL — FLUCONAZOLE 150 MG TABS: 150 | 1 days supply | Qty: 1 | Fill #0

## 2018-12-03 LAB — GC/CHLAMYDIA PROBE AMP (~~LOC~~) NOT AT ARMC
Chlamydia: NEGATIVE
Neisseria Gonorrhea: NEGATIVE

## 2019-01-17 ENCOUNTER — Emergency Department (HOSPITAL_BASED_OUTPATIENT_CLINIC_OR_DEPARTMENT_OTHER)
Admission: EM | Admit: 2019-01-17 | Discharge: 2019-01-17 | Disposition: A | Discharge status: Home / Self Care | Payer: Self-pay | Attending: Emergency Medicine | Admitting: Emergency Medicine

## 2019-01-17 ENCOUNTER — Other Ambulatory Visit: Payer: Self-pay

## 2019-01-17 ENCOUNTER — Encounter (HOSPITAL_BASED_OUTPATIENT_CLINIC_OR_DEPARTMENT_OTHER): Payer: Self-pay

## 2019-01-17 DIAGNOSIS — Z79899 Other long term (current) drug therapy: Secondary | ICD-10-CM | POA: Insufficient documentation

## 2019-01-17 DIAGNOSIS — F17210 Nicotine dependence, cigarettes, uncomplicated: Secondary | ICD-10-CM | POA: Insufficient documentation

## 2019-01-17 DIAGNOSIS — K0889 Other specified disorders of teeth and supporting structures: Secondary | ICD-10-CM | POA: Insufficient documentation

## 2019-01-17 DIAGNOSIS — Z91040 Latex allergy status: Secondary | ICD-10-CM | POA: Insufficient documentation

## 2019-01-17 MED ORDER — PENICILLIN V POTASSIUM 500 MG PO TABS: 500.0000 mg | ORAL_TABLET | Freq: Four times a day (QID) | ORAL | 0 refills | Status: AC

## 2019-01-17 MED ORDER — LIDOCAINE VISCOUS HCL 2 % MT SOLN: 15.0000 mL | OROMUCOSAL | 0 refills | Status: DC | PRN

## 2019-01-17 MED ORDER — PENICILLIN V POTASSIUM 250 MG PO TABS
500.0000 mg | ORAL_TABLET | Freq: Once | ORAL | Status: AC
  Administered 2019-01-17: 500 mg via ORAL
  Filled 2019-01-17: qty 2

## 2019-01-17 MED ORDER — KETOROLAC TROMETHAMINE 60 MG/2ML IM SOLN
30.0000 mg | Freq: Once | INTRAMUSCULAR | Status: AC
  Administered 2019-01-17: 30 mg via INTRAMUSCULAR
  Filled 2019-01-17: qty 2

## 2019-01-17 MED FILL — PENICILLIN VK 500 MG TABLET: 500 | 7 days supply | Qty: 28 | Fill #0

## 2019-01-17 MED FILL — LIDOCAINE 2% VISCOUS SOLN: 2 | 7 days supply | Qty: 100 | Fill #0

## 2019-01-17 NOTE — Discharge Instructions (Signed)
Please take all of your antibiotics until finished!   Take your antibiotics with food.  Common side effects of antibiotics include nausea, vomiting, abdominal discomfort, and diarrhea. You may help offset some of this with probiotics which you can buy or get in yogurt. Do not eat  or take the probiotics until 2 hours after your antibiotic.    Some studies suggest that certain antibiotics can reduce the efficacy of certain oral contraceptive pills (birth control), so please use additional contraceptives (condoms or other barrier method) while you are taking the antibiotics and for an additional 5 to 7 days afterwards if you are a female on these medications.   Apply warm compresses ir ice packs to jaw throughout the day. Alternate 600 mg of ibuprofen and [phone] mg of Tylenol every 3 hours as needed for pain. Do not exceed 4000 mg of Tylenol daily.  You may also use warm water salt gargles, Orajel, or other over-the-counter dental pain remedies. Use lidocaine swish and spit as needed for dental pain.   Followup with a dentist is very important for ongoing evaluation and management of recurrent dental pain.  I have given you the information for the dentist on-call as well as other outpatient resources.  Return to emergency department for emergent changing or worsening symptoms such as fever, worsening facial swelling, difficulty breathing or swallowing, throat tightness, or vision changes.

## 2019-01-17 NOTE — ED Provider Notes (Signed)
[facility] EMERGENCY DEPARTMENT Provider Note   CSN: [id number] Arrival date & time: 01/17/19  1639     History   Chief Complaint Chief Complaint  Patient presents with  . Dental Pain    HPI Christina Valenzuela is a 43 y.o. female presents today for evaluation of acute onset, progressively worsening right maxillary dental pain for 2 weeks.  She reports that the tooth was cracked sometime ago but did not really hurt initially.  Symptoms significantly worsened 3 days ago.  She notes a constant throbbing pain that does not radiate.  It worsens with eating meals on that side and with exposure to air.  She denies any fevers, facial swelling, difficulty breathing or swallowing.  Has taken ibuprofen with some relief.  She does not currently have a dentist.     The history is provided by the patient.    Past Medical History:  Diagnosis Date  . Depression     Patient Active Problem List   Diagnosis Date Noted  . Pelvic pain 07/29/2018  . Acute on chronic cholecystitis s/p lap cholecystectomy 01/05/2017 01/05/2017  . MRSA (methicillin resistant Staphylococcus aureus) colonization 01/05/2017  . Mixed incontinence 10/24/2016  . Chronic anemia 09/08/2015  . Pica 09/08/2015  . Primary insomnia 08/22/2015  . Depression with anxiety 08/22/2015  . Alcohol abuse 08/22/2015  . Headache 08/22/2015  . UTI symptoms 02/16/2014  . 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    Past Surgical History:  Procedure Laterality Date  . ABDOMINAL HYSTERECTOMY  11/28/2016   [facility] - Dr Zigmund Daniel.  UTERINE LEIOMYOMA w/ MENORRHAGIA  . CESAREAN SECTION    . CESAREAN SECTION    . CHOLECYSTECTOMY N/A 01/05/2017   Procedure: LAPAROSCOPIC CHOLECYSTECTOMY WITH INTRAOPERATIVE CHOLANGIOGRAM;  Surgeon: Michael Boston, MD;  Location: [facility];  Service: General;  Laterality: N/A;  . EPIGASTRIC HERNIA REPAIR     with mesh.  Dr Excell Seltzer  . TUBAL LIGATION       OB History    Gravida  8   Para  4   Term  3   Preterm  1   AB  4   Living  5     SAB  2   TAB  2   Ectopic  0   Multiple  1   Live Births  5            Home Medications    Prior to Admission medications   Medication Sig Start Date End Date Taking? Authorizing Provider  diclofenac (VOLTAREN) 75 MG EC tablet TAKE 1 TABLET (75 MG TOTAL) BY MOUTH 2 (TWO) TIMES DAILY WITH A MEAL. 11/09/18   Donnamae Jude, MD  ibuprofen (ADVIL,MOTRIN) 800 MG tablet Take by mouth. 09/04/16   [provider]  lidocaine (XYLOCAINE) 2 % solution Use as directed 15 mLs in the mouth or throat as needed for mouth pain. 01/17/19   Sherria Riemann A, PA-C  penicillin v potassium (VEETID) 500 MG tablet Take 1 tablet (500 mg total) by mouth 4 (four) times daily for 7 days. 01/17/19 01/24/19  Renita Papa, PA-C    Family History Family History  Problem Relation Age of Onset  . Diabetes Maternal Grandfather     Social History Social History   Tobacco Use  . Smoking status: Current Every Day Smoker    Packs/day: 0.25    Types: Cigarettes  . Smokeless tobacco: Never Used  Substance Use Topics  . Alcohol use: No    Alcohol/week: 0.0 standard drinks  .  Drug use: No     Allergies   Hydrocodone-acetaminophen and Latex   Review of Systems Review of Systems  Constitutional: Negative for chills and fever.  HENT: Positive for dental problem. Negative for drooling, sore throat and trouble swallowing.   Respiratory: Negative for shortness of breath.   Musculoskeletal: Negative for neck stiffness.     Physical Exam Updated Vital Signs BP (!) 131/95 (BP Location: Left Arm)   Pulse (!) 102   Temp 98.5 F (36.9 C) (Oral)   Resp 16   Ht 5\' 2"  (1.575 m)   Wt 77.1 kg   LMP 08/31/2016   SpO2 98%   BMI 31.09 kg/m   Physical Exam Vitals signs and nursing note reviewed.  Constitutional:      General: She is not in acute distress.    Appearance: She is well-developed.     Comments: Resting comfortably in chair  HENT:     Head:  Normocephalic and atraumatic.     Mouth/Throat:     Mouth: Mucous membranes are moist.     Comments: Diffusely decaying dentition with cracked and missing teeth.  The right second molar cracked along the lingular aspect with exposed dentin.  There is some tenderness to percussion overlying this tooth.  No trismus, tolerating secretions without difficulty.  Dentition appears to be stable. No noted area of swelling or fluctuance.Mouth opening to at least 3 finger widths. Handles oral secretions without difficulty. No noted facial swelling. No swelling or tenderness to the submental or submandibular regions. No swelling or tenderness into the soft tissues of the neck.    Eyes:     General:        Right eye: No discharge.        Left eye: No discharge.     Conjunctiva/sclera: Conjunctivae normal.  Neck:     Musculoskeletal: Normal range of motion. No neck rigidity.     Vascular: No JVD.     Trachea: No tracheal deviation.  Cardiovascular:     Rate and Rhythm: Normal rate.  Pulmonary:     Effort: Pulmonary effort is normal.  Abdominal:     General: There is no distension.  Skin:    General: Skin is warm and dry.     Findings: No erythema.  Neurological:     Mental Status: She is alert.  Psychiatric:        Behavior: Behavior normal.      ED Treatments / Results  Labs (all labs ordered are listed, but only abnormal results are displayed) Labs Reviewed - No data to display  EKG None  Radiology No results found.  Procedures Procedures (including critical care time)  Medications Ordered in ED Medications  penicillin v potassium (VEETID) tablet 500 mg (has no administration in time range)  ketorolac (TORADOL) injection 30 mg (has no administration in time range)     Initial Impression / Assessment and Plan / ED Course  I have reviewed the triage vital signs and the nursing notes.  Pertinent labs & imaging results that were available during my care of the patient  were reviewed by me and considered in my medical decision making (see chart for details).        Patient with toothache.  Afebrile, initially mildly tachycardic on triage but not tachycardic on my assessment.  Resting fairly comfortably no apparent distress.  No gross abscess.  Exam unconcerning for Ludwig's angina, retropharyngeal abscess, strep pharyngitis, or spread of infection.  No trismus, tolerating secretions without  difficulty.  Will treat with penicillin and viscous lidocaine.  Urged patient to follow-up with dentist and provided with outpatient resources for follow-up.  Discussed strict ED return precautions. Pt verbalized understanding of and agreement with plan and is safe for discharge home at this time.    Final Clinical Impressions(s) / ED Diagnoses   Final diagnoses:  Pain, dental    ED Discharge Orders         Ordered    penicillin v potassium (VEETID) 500 MG tablet  4 times daily     01/17/19 1742    lidocaine (XYLOCAINE) 2 % solution  As needed     01/17/19 1742           Renita Papa, PA-C 01/17/19 1743    Davonna Belling, MD 01/17/19 2336

## 2019-01-17 NOTE — ED Triage Notes (Signed)
Pt c/o right upper toothache x 1 week-NAD-steady gait

## 2019-04-13 ENCOUNTER — Emergency Department (HOSPITAL_BASED_OUTPATIENT_CLINIC_OR_DEPARTMENT_OTHER)
Admission: EM | Admit: 2019-04-13 | Discharge: 2019-04-13 | Disposition: A | Discharge status: Home / Self Care | Payer: Medicaid Other | Attending: Emergency Medicine | Admitting: Emergency Medicine

## 2019-04-13 ENCOUNTER — Other Ambulatory Visit: Payer: Self-pay

## 2019-04-13 ENCOUNTER — Encounter (HOSPITAL_BASED_OUTPATIENT_CLINIC_OR_DEPARTMENT_OTHER): Payer: Self-pay

## 2019-04-13 DIAGNOSIS — M25531 Pain in right wrist: Secondary | ICD-10-CM | POA: Insufficient documentation

## 2019-04-13 DIAGNOSIS — Z79899 Other long term (current) drug therapy: Secondary | ICD-10-CM | POA: Diagnosis not present

## 2019-04-13 DIAGNOSIS — F17210 Nicotine dependence, cigarettes, uncomplicated: Secondary | ICD-10-CM | POA: Insufficient documentation

## 2019-04-13 DIAGNOSIS — R202 Paresthesia of skin: Secondary | ICD-10-CM | POA: Insufficient documentation

## 2019-04-13 DIAGNOSIS — Z91040 Latex allergy status: Secondary | ICD-10-CM | POA: Diagnosis not present

## 2019-04-13 MED ORDER — NAPROXEN 500 MG PO TABS: 500.0000 mg | ORAL_TABLET | Freq: Two times a day (BID) | ORAL | 0 refills | Status: DC

## 2019-04-13 NOTE — ED Notes (Signed)
ED Provider at bedside. 

## 2019-04-13 NOTE — ED Provider Notes (Signed)
[facility] EMERGENCY DEPARTMENT Provider Note   CSN: [id number] Arrival date & time: 04/13/19  1338     History   Chief Complaint Chief Complaint  Patient presents with  . Numbness    HPI Christina Valenzuela is a 43 y.o. female past medical history of alcohol abuse, pica who presents for evaluation of tingling sensation, pain noted to her right wrist that has been ongoing for the last 3 days.  She states she first noticed it about 3 days ago after she was at work where she had done continuous, repetitive strenuous motions with her wrist.  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  She states that she will occasionally have sharp shocks of pain that radiate up her arm.  She states she has had some tingling to her hand that comes and goes.  She describes it as "feeling like her hand is asleep."  She states that it resolves on its own.  She came in today because she states that she was having more pain.  She describes a throbbing pain in the wrist that is radiating up the arm.  She felt like when she was trying to brush her hair, he was more difficult than usual.  She denies any preceding trauma, injury, fall.  She does feel like the slightly swollen but is not red or hot.  She has not noted any fevers.  She denies any vision changes, weakness of her shoulder or elbow, weakness or numbness of her lower extremities.  Difficulty speaking, facial droop.     The history is provided by the patient.    Past Medical History:  Diagnosis Date  . Depression     Patient Active Problem List   Diagnosis Date Noted  . Pelvic pain 07/29/2018  . Acute on chronic cholecystitis s/p lap cholecystectomy 01/05/2017 01/05/2017  . MRSA (methicillin resistant Staphylococcus aureus) colonization 01/05/2017  . Mixed incontinence 10/24/2016  . Chronic anemia 09/08/2015  . Pica 09/08/2015  . Primary  insomnia 08/22/2015  . Depression with anxiety 08/22/2015  . Alcohol abuse 08/22/2015  . Headache 08/22/2015  . UTI symptoms 02/16/2014  . 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    Past Surgical History:  Procedure Laterality Date  . ABDOMINAL HYSTERECTOMY  11/28/2016   [facility] - Dr Zigmund Daniel.  UTERINE LEIOMYOMA w/ MENORRHAGIA  . CESAREAN SECTION    . CESAREAN SECTION    . CHOLECYSTECTOMY N/A 01/05/2017   Procedure: LAPAROSCOPIC CHOLECYSTECTOMY WITH INTRAOPERATIVE CHOLANGIOGRAM;  Surgeon: Michael Boston, MD;  Location: [facility];  Service: General;  Laterality: N/A;  . EPIGASTRIC HERNIA REPAIR     with mesh.  Dr Excell Seltzer  . TUBAL LIGATION       OB History    Gravida  8   Para  4   Term  3   Preterm  1   AB  4   Living  5     SAB  2   TAB  2   Ectopic  0   Multiple  1   Live Births  5            Home Medications    Prior to Admission medications   Medication Sig Start Date End Date Taking? Authorizing Provider  diclofenac (VOLTAREN) 75 MG EC tablet TAKE 1 TABLET (75 MG TOTAL) BY MOUTH 2 (TWO) TIMES DAILY WITH A MEAL. 11/09/18   Donnamae Jude, MD  ibuprofen (ADVIL,MOTRIN) 800 MG tablet Take by mouth. 09/04/16   [provider]  lidocaine (XYLOCAINE) 2 % solution Use as directed 15 mLs in the mouth or throat as needed for mouth pain. 01/17/19   Fawze, Mina A, PA-C  naproxen (NAPROSYN) 500 MG tablet Take 1 tablet (500 mg total) by mouth 2 (two) times daily. 04/13/19   Volanda Napoleon, PA-C    Family History Family History  Problem Relation Age of Onset  . Diabetes Maternal Grandfather     Social History Social History   Tobacco Use  . Smoking status: Current Every Day Smoker    Packs/day: 0.25    Types: Cigarettes  . Smokeless tobacco: Never Used  Substance Use Topics  . Alcohol use: No    Alcohol/week: 0.0 standard drinks  . Drug use: No     Allergies   Hydrocodone-acetaminophen and Latex   Review of Systems Review of Systems   Constitutional: Negative for fever.  Eyes: Negative for visual disturbance.  Musculoskeletal:       Right wrist pain  Skin: Positive for color change.  Neurological: Positive for numbness (Tingling). Negative for speech difficulty and weakness.  All other systems reviewed and are negative.    Physical Exam Updated Vital Signs BP 117/84 (BP Location: Left Arm)   Pulse 90   Temp 98.8 F (37.1 C) (Oral)   Resp 18   Ht 5\' 2"  (1.575 m)   Wt 73 kg   LMP 08/31/2016   SpO2 98%   BMI 29.45 kg/m   Physical Exam Vitals signs and nursing note reviewed.  Constitutional:      Appearance: She is well-developed.  HENT:     Head: Normocephalic and atraumatic.  Eyes:     General: No scleral icterus.       Right eye: No discharge.        Left eye: No discharge.     Conjunctiva/sclera: Conjunctivae normal.     Comments: PERRL. EOMs intact. No nystagmus. No neglect.   Cardiovascular:     Pulses:          Radial pulses are 2+ on the right side and 2+ on the left side.  Pulmonary:     Effort: Pulmonary effort is normal.  Musculoskeletal:     Comments: Tenderness palpation noted to the volar aspect of the right wrist and hand, particularly at the thenar eminence.  There is some mild overlying soft tissue swelling noted of the thenar eminence.  No overlying warmth, erythema.  Flexion/extension of wrist intact.  No bony deformity or crepitus noted.  Positive Phalen's, Tinel's sign.  No tenderness palpation of the right elbow, right shoulder.  Full range of motion of right shoulder without any difficulty.  Full range of motion of right elbow intact with any difficulty.  No tenderness palpation in the left upper extremity.  Skin:    General: Skin is warm and dry.     Capillary Refill: Capillary refill takes less than 2 seconds.     Comments: Good distal cap refill.  RUE is not dusky in appearance or cool to touch.  Neurological:     Mental Status: She is alert.     Comments: Cranial nerves  III-XII intact Follows commands, Moves all extremities  5/5 strength to BUE and BLE  Sensation intact throughout all major nerve distributions.  Patient reports no sensory difference between the bilateral upper extremities.  Equal grip strength bilaterally. No pronator drift. No gait abnormalities  No slurred  speech. No facial droop.   Psychiatric:        Speech: Speech normal.        Behavior: Behavior normal.      ED Treatments / Results  Labs (all labs ordered are listed, but only abnormal results are displayed) Labs Reviewed - No data to display  EKG None  Radiology No results found.  Procedures Procedures (including critical care time)  Medications Ordered in ED Medications - No data to display   Initial Impression / Assessment and Plan / ED Course  I have reviewed the triage vital signs and the nursing notes.  Pertinent labs & imaging results that were available during my care of the patient were reviewed by me and considered in my medical decision making (see chart for details).        43 year old female who reports for pain and tingling sensation to right wrist x3 days.  Reports recent new job and has been doing repetitive motions for last several weeks.  Does feel like a slightly swollen but no fevers, warmth. Patient is afebrile, non-toxic appearing, sitting comfortably on examination table. Vital signs reviewed and stable.  Patient is neurovascularly intact.  Exam consistent with carpal tunnel syndrome as she has positive Tinel's, Phalen's.  Her symptoms are isolated to her wrist.  She has no numbness to her right upper extremity/no weakness to right upper extremity.  History/physical exam not concerning for septic arthritis, CVA.  Plan to put her in a splint.  Encourage NSAID use.  Plan for outpatient referral to orthopedics for further evaluation if symptoms do not improve.  Portions of this note were generated with Lobbyist. Dictation errors  may occur despite best attempts at proofreading.   Final Clinical Impressions(s) / ED Diagnoses   Final diagnoses:  Wrist pain, acute, right    ED Discharge Orders         Ordered    naproxen (NAPROSYN) 500 MG tablet  2 times daily     04/13/19 1436           Desma Mcgregor 04/13/19 1730    Virgel Manifold, MD 04/14/19 [phone]

## 2019-04-13 NOTE — Discharge Instructions (Signed)
Wear splint for support and stabilization.  Take naproxen as directed.  Follow-up with referred orthopedic doctor if symptoms do not improve or get worse.  Return emergency department for any worsening pain, redness or swelling of the wrist, fevers, inability to move your fingers or hand, complete numbness of your extremity, difficulty speaking, vision changes or any other worsening concerning symptoms.

## 2019-04-13 NOTE — ED Triage Notes (Addendum)
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

## 2019-05-24 DIAGNOSIS — B379 Candidiasis, unspecified: Secondary | ICD-10-CM

## 2019-05-24 MED ORDER — FLUCONAZOLE 150 MG PO TABS: 150.0000 mg | ORAL_TABLET | Freq: Once | ORAL | 0 refills | Status: AC

## 2019-06-17 ENCOUNTER — Ambulatory Visit: Payer: Medicaid Other | Attending: Internal Medicine

## 2019-06-17 DIAGNOSIS — Z20822 Contact with and (suspected) exposure to covid-19: Secondary | ICD-10-CM | POA: Insufficient documentation

## 2019-06-18 LAB — NOVEL CORONAVIRUS, NAA: SARS-CoV-2, NAA: NOT DETECTED

## 2019-06-20 ENCOUNTER — Ambulatory Visit: Payer: Self-pay

## 2019-06-24 ENCOUNTER — Emergency Department (HOSPITAL_BASED_OUTPATIENT_CLINIC_OR_DEPARTMENT_OTHER)
Admission: EM | Admit: 2019-06-24 | Discharge: 2019-06-24 | Disposition: A | Discharge status: Home / Self Care | Payer: Medicaid Other | Attending: Emergency Medicine | Admitting: Emergency Medicine

## 2019-06-24 ENCOUNTER — Encounter (HOSPITAL_BASED_OUTPATIENT_CLINIC_OR_DEPARTMENT_OTHER): Payer: Self-pay | Admitting: *Deleted

## 2019-06-24 ENCOUNTER — Other Ambulatory Visit: Payer: Self-pay

## 2019-06-24 DIAGNOSIS — M79644 Pain in right finger(s): Secondary | ICD-10-CM | POA: Insufficient documentation

## 2019-06-24 DIAGNOSIS — F17210 Nicotine dependence, cigarettes, uncomplicated: Secondary | ICD-10-CM | POA: Diagnosis not present

## 2019-06-24 DIAGNOSIS — S60359A Superficial foreign body of unspecified thumb, initial encounter: Secondary | ICD-10-CM

## 2019-06-24 DIAGNOSIS — S60351A Superficial foreign body of right thumb, initial encounter: Secondary | ICD-10-CM | POA: Diagnosis not present

## 2019-06-24 DIAGNOSIS — Z1881 Retained glass fragments: Secondary | ICD-10-CM | POA: Diagnosis not present

## 2019-06-24 NOTE — Discharge Instructions (Signed)
As we discussed, you need further evaluation for removal of this foreign body.  Follow-up with referred hand surgeon.  Return the emergency department for any fevers, warmth, [name] of the thumb or any other worsening or concerning symptoms.
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
## 2019-07-11 ENCOUNTER — Other Ambulatory Visit: Payer: Self-pay

## 2019-07-11 ENCOUNTER — Emergency Department (HOSPITAL_BASED_OUTPATIENT_CLINIC_OR_DEPARTMENT_OTHER): Payer: Medicaid Other

## 2019-07-11 ENCOUNTER — Emergency Department (HOSPITAL_BASED_OUTPATIENT_CLINIC_OR_DEPARTMENT_OTHER)
Admission: EM | Admit: 2019-07-11 | Discharge: 2019-07-11 | Disposition: A | Discharge status: Home / Self Care | Payer: Medicaid Other | Attending: Emergency Medicine | Admitting: Emergency Medicine

## 2019-07-11 ENCOUNTER — Encounter (HOSPITAL_BASED_OUTPATIENT_CLINIC_OR_DEPARTMENT_OTHER): Payer: Self-pay | Admitting: Emergency Medicine

## 2019-07-11 DIAGNOSIS — Z91040 Latex allergy status: Secondary | ICD-10-CM | POA: Diagnosis not present

## 2019-07-11 DIAGNOSIS — M25562 Pain in left knee: Secondary | ICD-10-CM | POA: Diagnosis not present

## 2019-07-11 DIAGNOSIS — M25561 Pain in right knee: Secondary | ICD-10-CM | POA: Diagnosis not present

## 2019-07-11 DIAGNOSIS — F17210 Nicotine dependence, cigarettes, uncomplicated: Secondary | ICD-10-CM | POA: Insufficient documentation

## 2019-07-11 DIAGNOSIS — M25462 Effusion, left knee: Secondary | ICD-10-CM | POA: Diagnosis not present

## 2019-07-11 NOTE — Discharge Instructions (Signed)
Your history and exam today are consistent with bilateral knee pain likely due to the overexertion and overuse of your knees.  As you did not have acute trauma, we had a low suspicion we would find fracture but we did do x-rays of both knees given previous injury.  Both x-rays did not show fracture dislocation and there was some swelling on the left side which we discussed.  Please use anti-inflammatory medication and rest.  You may also ice them.  Please use the knee sleeve to help maintain pressure and support and please follow-up with sports medicine.  If any symptoms change or worsen, please return to nearest emergency department.

## 2019-07-11 NOTE — ED Triage Notes (Signed)
Bilateral knee pain, worse on left.  Sts she has always had problems with the left knee but since yesterday it has been much worse.  Recently started training with a trainer and is doing squats with weight.  Knee problems worse since starting that.

## 2019-07-11 NOTE — ED Provider Notes (Signed)
[place] EMERGENCY DEPARTMENT Provider Note   CSN: [id number] Arrival date & time: 07/11/19  0736     History Chief Complaint  Patient presents with  . Knee Pain    Christina Valenzuela is a 44 y.o. female.  The history is provided by the patient and medical records. No language interpreter was used.  Knee Pain Location:  Knee Injury: no   Knee location:  R knee and L knee Pain details:    Quality:  Aching   Radiates to:  Does not radiate   Severity:  Moderate   Onset quality:  Gradual   Timing:  Constant   Progression:  Unchanged Chronicity:  Recurrent Dislocation: no   Foreign body present:  No foreign bodies Tetanus status:  Unknown Prior injury to area:  Yes Relieved by:  Nothing Worsened by:  Exercise Ineffective treatments:  None tried Associated symptoms: swelling   Associated symptoms: no back pain, no fatigue, no fever, no muscle weakness, no neck pain, no numbness, no stiffness and no tingling   Risk factors: no concern for non-accidental trauma        Past Medical History:  Diagnosis Date  . Depression     Patient Active Problem List   Diagnosis Date Noted  . Pelvic pain 07/29/2018  . Acute on chronic cholecystitis s/p lap cholecystectomy 01/05/2017 01/05/2017  . MRSA (methicillin resistant Staphylococcus aureus) colonization 01/05/2017  . Mixed incontinence 10/24/2016  . Chronic anemia 09/08/2015  . Pica 09/08/2015  . Primary insomnia 08/22/2015  . Depression with anxiety 08/22/2015  . Alcohol abuse 08/22/2015  . Headache 08/22/2015  . UTI symptoms 02/16/2014  . 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    Past Surgical History:  Procedure Laterality Date  . ABDOMINAL HYSTERECTOMY  11/28/2016   [facility] - Dr Zigmund Daniel.  UTERINE LEIOMYOMA w/ MENORRHAGIA  . CESAREAN SECTION    . CESAREAN SECTION    . CHOLECYSTECTOMY N/A 01/05/2017   Procedure: LAPAROSCOPIC CHOLECYSTECTOMY WITH INTRAOPERATIVE CHOLANGIOGRAM;  Surgeon: Michael Boston, MD;   Location: [facility];  Service: General;  Laterality: N/A;  . EPIGASTRIC HERNIA REPAIR     with mesh.  Dr Excell Seltzer  . TUBAL LIGATION       OB History    Gravida  8   Para  4   Term  3   Preterm  1   AB  4   Living  5     SAB  2   TAB  2   Ectopic  0   Multiple  1   Live Births  5           Family History  Problem Relation Age of Onset  . Diabetes Maternal Grandfather     Social History   Tobacco Use  . Smoking status: Current Every Day Smoker    Packs/day: 0.25    Types: Cigarettes  . Smokeless tobacco: Never Used  Substance Use Topics  . Alcohol use: No    Alcohol/week: 0.0 standard drinks  . Drug use: No    Home Medications Prior to Admission medications   Medication Sig Start Date End Date Taking? Authorizing Provider  diphenhydrAMINE (BENADRYL) 25 MG tablet Take 50 mg by mouth at bedtime as needed.   Yes [provider]    Allergies    Hydrocodone-acetaminophen and Latex  Review of Systems   Review of Systems  Constitutional: Negative for chills, fatigue and fever.  HENT: Negative for congestion.   Eyes: Negative for visual disturbance.  Respiratory: Negative  for cough, chest tightness, shortness of breath and wheezing.   Cardiovascular: Negative for chest pain.  Gastrointestinal: Negative for abdominal pain.  Genitourinary: Negative for flank pain.  Musculoskeletal: Negative for back pain, neck pain and stiffness.  Neurological: Negative for light-headedness and headaches.  Psychiatric/Behavioral: Negative for agitation.  All other systems reviewed and are negative.   Physical Exam Updated Vital Signs BP (!) 121/98 (BP Location: Left Arm)   Pulse 88   Temp 97.9 F (36.6 C) (Oral)   Resp 16   Ht 5\' 2"  (1.575 m)   Wt 67.6 kg   LMP 08/31/2016   SpO2 100%   BMI 27.25 kg/m   Physical Exam Vitals and nursing note reviewed.  Constitutional:      General: She is not in acute distress.    Appearance: She is  well-developed. She is not ill-appearing, toxic-appearing or diaphoretic.  HENT:     Head: Normocephalic and atraumatic.     Right Ear: External ear normal.     Left Ear: External ear normal.     Nose: Nose normal. No congestion or rhinorrhea.     Mouth/Throat:     Pharynx: No oropharyngeal exudate.  Eyes:     Conjunctiva/sclera: Conjunctivae normal.     Pupils: Pupils are equal, round, and reactive to light.  Cardiovascular:     Rate and Rhythm: Normal rate.     Pulses: Normal pulses.     Heart sounds: No murmur.  Pulmonary:     Effort: No respiratory distress.     Breath sounds: No stridor. No wheezing, rhonchi or rales.  Chest:     Chest wall: No tenderness.  Abdominal:     General: There is no distension.     Tenderness: There is no abdominal tenderness. There is no right CVA tenderness, left CVA tenderness or rebound.  Musculoskeletal:        General: Swelling and tenderness present. No deformity or signs of injury.     Cervical back: Normal range of motion and neck supple. No tenderness.     Right lower leg: No edema.     Left lower leg: No edema.  Skin:    General: Skin is warm.     Capillary Refill: Capillary refill takes less than 2 seconds.     Coloration: Skin is not pale.     Findings: No erythema or rash.  Neurological:     General: No focal deficit present.     Mental Status: She is alert and oriented to person, place, and time.     Sensory: No sensory deficit.     Motor: No weakness or abnormal muscle tone.     Deep Tendon Reflexes: Reflexes are normal and symmetric.  Psychiatric:        Mood and Affect: Mood normal.     ED Results / Procedures / Treatments   Labs (all labs ordered are listed, but only abnormal results are displayed) Labs Reviewed - No data to display  EKG None  Radiology DG Knee Complete 4 Views Left  Result Date: 07/11/2019 CLINICAL DATA:  Bilateral knee pain for several months. EXAM: LEFT KNEE - COMPLETE 4+ VIEW COMPARISON:   October 15, 2015. FINDINGS: No evidence of fracture or dislocation. Mild suprapatellar joint effusion is noted. No evidence of arthropathy or other focal bone abnormality. Soft tissues are unremarkable. IMPRESSION: Mild suprapatellar joint effusion. No other abnormality seen in the left knee. Electronically Signed   By: Bobbe Medico.D.  On: 07/11/2019 08:37   DG Knee Complete 4 Views Right  Result Date: 07/11/2019 CLINICAL DATA:  Bilateral knee pain for several months. EXAM: RIGHT KNEE - COMPLETE 4+ VIEW COMPARISON:  None. FINDINGS: No evidence of fracture, dislocation, or joint effusion. No evidence of arthropathy or other focal bone abnormality. Soft tissues are unremarkable. IMPRESSION: Negative. Electronically Signed   By: Marijo Conception M.D.   On: 07/11/2019 08:38    Procedures Procedures (including critical care time)  Medications Ordered in ED Medications - No data to display  ED Course  I have reviewed the triage vital signs and the nursing notes.  Pertinent labs & imaging results that were available during my care of the patient were reviewed by me and considered in my medical decision making (see chart for details).    MDM Rules/Calculators/A&P                      FALIN ADERMAN is a 44 y.o. female with a past medical history significant for left knee injury years ago who presents with bilateral knee pain.  She reports that years ago while dancing she injured her left knee but never was evaluated by medical provider.  She reports that her symptoms improved after time and she has not had problems with it since.  She reports that for the last year or so, she is been doing more exercises and working out with a Physiological scientist and has had worsened knee pain.  She reports that on Saturday, the pain worsened in both knees worse on the left than the right including more swelling.  She reports she has not yet taken any medication for it and has used some old knee braces to try and  help with her symptoms.  She reports she stands on her feet all day working in [employer] and was having difficulty bearing the discomfort when she tried to ambulate.  She denies any new specific injuries, no fevers, chills, urinary symptoms, GI symptoms, Covid symptoms.  She denies any knee or other skin rashes.  She denies any numbness, tingling, or focal weakness.  She denies other complaints.  She describes the pain is moderate to severe when she tries to ambulate or move.  She denies any calf pain or swelling.  On exam, lungs are clear and chest is nontender.  Abdomen is nontender.  Hips are nontender.  Normal sensation and strength in lower extremities.  Tenderness in both knees.  No calf tenderness or swelling.  Mild swelling of both knees.  No overlying erythema or warmth.  Patient had minimal pain with passive movement of the knee but when she tried to actively bend her knee both with extension and flexion she had pain.  Exam otherwise unremarkable.  Good pulses.  Had a shared decision-making conversation with patient and we agreed to get x-rays that she has not had any imaging of her knees in years.  Although I have a low suspicion for injury, given the repeat overuse with her new trainer and previous knee injuries, it is reasonable to make sure there is no significant fracture seen.  If imaging is reassuring or only has some effusion which I suspect will be present, anticipate having patient follow-up with outpatient sports medicine, getting knee immobilizers, and starting anti-inflammatory medication.  Patient is agreeable to this plan, anticipate reassessment after x-rays.  11:15 AM X-ray showed no fracture dislocation.  Some effusion seen on the left which was expected.  Clinically have low suspicion for infection.  Suspect it is due to overuse.  Agreed with plans for sports medicine follow-up and initiation of rice therapy and anti-inflammatory medications.  We will give her better  knee sleeves and crutches to help her support the knees and help minimize the pain.  She will follow-up with sports medicine.  She agreed with plan of care no other burns or concerns.  Patient was discharged in good condition.    Final Clinical Impression(s) / ED Diagnoses Final diagnoses:  Acute pain of both knees    Rx / DC Orders ED Discharge Orders    None     Clinical Impression: 1. Acute pain of both knees     Disposition: Discharge  Condition: Good  I have discussed the results, Dx and Tx plan with the pt(& family if present). He/she/they expressed understanding and agree(s) with the plan. Discharge instructions discussed at great length. Strict return precautions discussed and pt &/or family have verbalized understanding of the instructions. No further questions at time of discharge.    New Prescriptions   No medications on file    Follow Up: Rosemarie Ax, MD [address] [place] [id number] [phone]     [facility] [id number] [place] [phone]       Nickolis Diel, Gwenyth Allegra, MD 07/11/19 [phone]

## 2019-07-13 ENCOUNTER — Encounter: Payer: Self-pay | Admitting: Family Medicine

## 2019-07-13 ENCOUNTER — Ambulatory Visit: Payer: Medicaid Other | Admitting: Family Medicine

## 2019-07-13 ENCOUNTER — Ambulatory Visit: Payer: Self-pay

## 2019-07-13 ENCOUNTER — Other Ambulatory Visit: Payer: Self-pay

## 2019-07-13 VITALS — BP 110/77 | Ht 62.0 in | Wt 151.0 lb

## 2019-07-13 DIAGNOSIS — M25462 Effusion, left knee: Secondary | ICD-10-CM | POA: Diagnosis not present

## 2019-07-13 DIAGNOSIS — M25461 Effusion, right knee: Secondary | ICD-10-CM | POA: Diagnosis not present

## 2019-07-13 DIAGNOSIS — M5412 Radiculopathy, cervical region: Secondary | ICD-10-CM | POA: Diagnosis not present

## 2019-07-13 DIAGNOSIS — M25562 Pain in left knee: Secondary | ICD-10-CM

## 2019-07-13 DIAGNOSIS — M25561 Pain in right knee: Secondary | ICD-10-CM

## 2019-07-13 MED ORDER — PREDNISONE 5 MG PO TABS: ORAL_TABLET | ORAL | 0 refills | Status: DC

## 2019-07-13 MED FILL — predniSONE 5 MG TABS: 5 | 6 days supply | Qty: 21 | Fill #0

## 2019-07-13 NOTE — Assessment & Plan Note (Signed)
Has effusions bilaterally.  Does not demonstrate significant arthritic change and mild degenerative changes of the meniscus.  No family history of inflammatory disease. -Counseled on home exercise therapy and supportive care. -Prednisone. -Provided samples of Duexis. -Hinged knee brace. -Could consider injection and rheumatologic work-up if ongoing.

## 2019-07-13 NOTE — Assessment & Plan Note (Signed)
Altered sensation in the hands seems more likely related to be radiculopathy.  Seems less likely to be associated with carpal tunnel. -Counseled on home exercise therapy and supportive care. -Could consider injection of the carpal tunnel if symptoms are ongoing. -Could consider imaging or physical therapy.

## 2019-07-13 NOTE — Progress Notes (Signed)
Medication Samples have been provided to the patient.  Drug name: Duexis      Strength: 800mg /26.6mg         Qty: 2 Boxes  [id number]  Exp.Date: 06/2020  Dosing instructions: Take 1 tablet by mouth three (3) times a day.  The patient has been instructed regarding the correct time, dose, and frequency of taking this medication, including desired effects and most common side effects.   Sherrie George, [place] 11:36 AM 07/13/2019

## 2019-07-13 NOTE — Patient Instructions (Signed)
Nice to meet you Congrats on the weight loss. Keep it up!  Please try ice  Please try the exercises  Please try the duexis as needed after the prednisone  Please try voltaren over the counter for the knees  Please send me a message in MyChart with any questions or updates.  Please see me back in 4 weeks.   --Dr. Raeford Razor

## 2019-07-13 NOTE — Progress Notes (Signed)
Christina Valenzuela - 44 y.o. female MRN [id number]  Date of birth: 1976-01-29  SUBJECTIVE:  Including CC & ROS.  Chief Complaint  Patient presents with  . Knee Pain    bilateral knee    Christina Valenzuela is a 44 y.o. female that is presenting with bilateral knee pain and altered sensation of the right upper extremity.  The pain in the knees is occurring over the medial aspects bilaterally.  Pain is acute on chronic in nature.  Left seems to be worse than the right.  Has been wearing a brace with the left which seems to help but she feels like it may give way.  No previous surgery.  She has an altered sensation in the right hand.  It also extends proximally.  She notices it when she use the hand more often.  She has tried a splint at night with limited improvement..  Independent review of the right knee x-rays from 3/8 shows no acute abnormality.  Independent review of the left knee x-rays from 3/8 show no acute bony changes.   Review of Systems See HPI   HISTORY: Past Medical, Surgical, Social, and Family History Reviewed & Updated per EMR.   Pertinent Historical Findings include:  Past Medical History:  Diagnosis Date  . Depression     Past Surgical History:  Procedure Laterality Date  . ABDOMINAL HYSTERECTOMY  11/28/2016   [facility] - Dr Zigmund Daniel.  UTERINE LEIOMYOMA w/ MENORRHAGIA  . CESAREAN SECTION    . CESAREAN SECTION    . CHOLECYSTECTOMY N/A 01/05/2017   Procedure: LAPAROSCOPIC CHOLECYSTECTOMY WITH INTRAOPERATIVE CHOLANGIOGRAM;  Surgeon: Michael Boston, MD;  Location: [facility];  Service: General;  Laterality: N/A;  . EPIGASTRIC HERNIA REPAIR     with mesh.  Dr Excell Seltzer  . TUBAL LIGATION      Family History  Problem Relation Age of Onset  . Diabetes Maternal Grandfather     Social History   Socioeconomic History  . Marital status: Married    Spouse name: Not on file  . Number of children: 5  . Years of education: 81  . Highest education level: Not on file  Occupational  History  . Not on file  Tobacco Use  . Smoking status: Current Every Day Smoker    Packs/day: 0.25    Types: Cigarettes  . Smokeless tobacco: Never Used  Substance and Sexual Activity  . Alcohol use: No    Alcohol/week: 0.0 standard drinks  . Drug use: No  . Sexual activity: Not on file  Other Topics Concern  . Not on file  Social History Narrative   Drinks caffeine    Social Determinants of Health   Financial Resource Strain:   . Difficulty of Paying Living Expenses: Not on file  Food Insecurity:   . Worried About Charity fundraiser in the Last Year: Not on file  . Ran Out of Food in the Last Year: Not on file  Transportation Needs:   . Lack of Transportation (Medical): Not on file  . Lack of Transportation (Non-Medical): Not on file  Physical Activity:   . Days of Exercise per Week: Not on file  . Minutes of Exercise per Session: Not on file  Stress:   . Feeling of Stress : Not on file  Social Connections:   . Frequency of Communication with Friends and Family: Not on file  . Frequency of Social Gatherings with Friends and Family: Not on file  . Attends Religious Services: Not on  file  . Active Member of Clubs or Organizations: Not on file  . Attends Archivist Meetings: Not on file  . Marital Status: Not on file  Intimate Partner Violence:   . Fear of Current or Ex-Partner: Not on file  . Emotionally Abused: Not on file  . Physically Abused: Not on file  . Sexually Abused: Not on file     PHYSICAL EXAM:  VS: BP 110/77   Ht 5\' 2"  (1.575 m)   Wt 151 lb (68.5 kg)   LMP 08/31/2016   BMI 27.62 kg/m  Physical Exam Gen: NAD, alert, cooperative with exam, well-appearing MSK:  Right and left knee: No obvious effusion. Normal range of motion. Tenderness to palpation over the medial joint line. No instability with valgus or varus stress testing. Negative McMurray's test. No pain with patellar grind. Right and left hand: No signs of atrophy. Normal  grip strength. Normal wrist range of motion. Negative Finkelstein's test. Neurovascularly intact  Limited ultrasound: Right knee, left knee, right wrist, left wrist:  Left knee: Moderate effusion within the suprapatellar pouch. Normal-appearing quadricep and patellar tendon. Medial meniscus with some mild degenerative changes and joint space narrowing. Lateral meniscus with no significant changes.  Right knee: Moderate effusion within the suprapatellar pouch. Normal-appearing quadricep and patellar tendon. Medial meniscus with some mild degenerative changes and joint space narrowing. Lateral meniscus with no significant changes.  Right and left wrist: Median nerve appears to be normal in each side.  Summary: She has effusions bilaterally.  Normal-appearing median nerve in the right and left wrist  Ultrasound and interpretation by Clearance Coots, MD    ASSESSMENT & PLAN:   Cervical radiculopathy Altered sensation in the hands seems more likely related to be radiculopathy.  Seems less likely to be associated with carpal tunnel. -Counseled on home exercise therapy and supportive care. -Could consider injection of the carpal tunnel if symptoms are ongoing. -Could consider imaging or physical therapy.  Effusion of both knee joints Has effusions bilaterally.  Does not demonstrate significant arthritic change and mild degenerative changes of the meniscus.  No family history of inflammatory disease. -Counseled on home exercise therapy and supportive care. -Prednisone. -Provided samples of Duexis. -Hinged knee brace. -Could consider injection and rheumatologic work-up if ongoing.

## 2019-08-04 DIAGNOSIS — U071 COVID-19: Secondary | ICD-10-CM | POA: Diagnosis not present

## 2019-08-04 DIAGNOSIS — Z20828 Contact with and (suspected) exposure to other viral communicable diseases: Secondary | ICD-10-CM | POA: Diagnosis not present

## 2019-08-08 DIAGNOSIS — Z03818 Encounter for observation for suspected exposure to other biological agents ruled out: Secondary | ICD-10-CM | POA: Diagnosis not present

## 2019-08-08 DIAGNOSIS — Z20828 Contact with and (suspected) exposure to other viral communicable diseases: Secondary | ICD-10-CM | POA: Diagnosis not present

## 2019-08-10 ENCOUNTER — Ambulatory Visit: Payer: Medicaid Other | Admitting: Family Medicine

## 2019-08-17 ENCOUNTER — Ambulatory Visit: Payer: Medicaid Other | Admitting: Family Medicine

## 2019-08-23 ENCOUNTER — Telehealth: Payer: Self-pay | Admitting: Family Medicine

## 2019-08-23 NOTE — Telephone Encounter (Signed)
Patient called in wanting to make an appointment to have an annual exam now that she has insurance. Patient was given an appointment for 6/3 @ 3:35. Patient stated that this appointment was too far out and needs something a little sooner due to some issues that she is having. Patient instructed that this is the next available appointment but I could send a message to the nurses to see if they could do something until she can come in for her appointment. Patient verbalized understanding. Message sent to clinical pool.

## 2019-08-24 ENCOUNTER — Other Ambulatory Visit: Payer: Self-pay

## 2019-08-24 ENCOUNTER — Encounter: Payer: Self-pay | Admitting: Family Medicine

## 2019-08-24 ENCOUNTER — Ambulatory Visit: Payer: Medicaid Other | Admitting: Family Medicine

## 2019-08-24 VITALS — BP 122/84 | HR 97 | Ht 62.0 in | Wt 144.0 lb

## 2019-08-24 DIAGNOSIS — M25462 Effusion, left knee: Secondary | ICD-10-CM

## 2019-08-24 DIAGNOSIS — M778 Other enthesopathies, not elsewhere classified: Secondary | ICD-10-CM | POA: Diagnosis not present

## 2019-08-24 DIAGNOSIS — M25461 Effusion, right knee: Secondary | ICD-10-CM

## 2019-08-24 DIAGNOSIS — M65341 Trigger finger, right ring finger: Secondary | ICD-10-CM | POA: Insufficient documentation

## 2019-08-24 NOTE — Assessment & Plan Note (Signed)
Acutely occurring and worse in the mornings. -Provided splint. -Could consider injection.

## 2019-08-24 NOTE — Assessment & Plan Note (Signed)
Appears to be more of a capsular issue with movement.  Less likely for rotator cuff. -Counseled on home exercise therapy and supportive care. -Could consider imaging, injection or therapy.

## 2019-08-24 NOTE — Progress Notes (Signed)
Christina Valenzuela - 44 y.o. female MRN [id number]  Date of birth: [birth year]  SUBJECTIVE:  Including CC & ROS.  Chief Complaint  Patient presents with  . Follow-up    follow up for bilateral knee    Christina Valenzuela is a 44 y.o. female that is presenting with right shoulder pain, right ring finger trigger and following up for her bilateral knee problems.  Her shoulder has been acute on chronic in nature.  Seems to be worse with certain movements.  It is worse in the morning.  Denies any history of surgery.  She is having triggering of the ring finger on the right hand.  Is worse in the morning.  The knees have improved with the Duexis as well as change in jobs.   Review of Systems See HPI   HISTORY: Past Medical, Surgical, Social, and Family History Reviewed & Updated per EMR.   Pertinent Historical Findings include:  Past Medical History:  Diagnosis Date  . Depression     Past Surgical History:  Procedure Laterality Date  . ABDOMINAL HYSTERECTOMY  11/28/2016   [facility] - Dr Zigmund Daniel.  UTERINE LEIOMYOMA w/ MENORRHAGIA  . CESAREAN SECTION    . CESAREAN SECTION    . CHOLECYSTECTOMY N/A 01/05/2017   Procedure: LAPAROSCOPIC CHOLECYSTECTOMY WITH INTRAOPERATIVE CHOLANGIOGRAM;  Surgeon: Michael Boston, MD;  Location: [facility];  Service: General;  Laterality: N/A;  . EPIGASTRIC HERNIA REPAIR     with mesh.  Dr Excell Seltzer  . TUBAL LIGATION      Family History  Problem Relation Age of Onset  . Diabetes Maternal Grandfather     Social History   Socioeconomic History  . Marital status: Married    Spouse name: Not on file  . Number of children: 5  . Years of education: 60  . Highest education level: Not on file  Occupational History  . Not on file  Tobacco Use  . Smoking status: Current Every Day Smoker    Packs/day: 0.25    Types: Cigarettes  . Smokeless tobacco: Never Used  Substance and Sexual Activity  . Alcohol use: No    Alcohol/week: 0.0 standard drinks  . Drug use: No    . Sexual activity: Not on file  Other Topics Concern  . Not on file  Social History Narrative   Drinks caffeine    Social Determinants of Health   Financial Resource Strain:   . Difficulty of Paying Living Expenses:   Food Insecurity:   . Worried About Charity fundraiser in the Last Year:   . Arboriculturist in the Last Year:   Transportation Needs:   . Film/video editor (Medical):   Marland Kitchen Lack of Transportation (Non-Medical):   Physical Activity:   . Days of Exercise per Week:   . Minutes of Exercise per Session:   Stress:   . Feeling of Stress :   Social Connections:   . Frequency of Communication with Friends and Family:   . Frequency of Social Gatherings with Friends and Family:   . Attends Religious Services:   . Active Member of Clubs or Organizations:   . Attends Archivist Meetings:   Marland Kitchen Marital Status:   Intimate Partner Violence:   . Fear of Current or Ex-Partner:   . Emotionally Abused:   Marland Kitchen Physically Abused:   . Sexually Abused:      PHYSICAL EXAM:  VS: BP 122/84   Pulse 97   Ht 5\' 2"  (1.575 m)  Wt 144 lb (65.3 kg)   LMP 08/31/2016   BMI 26.34 kg/m  Physical Exam Gen: NAD, alert, cooperative with exam, well-appearing MSK:  Right shoulder: Near normal flexion and abduction. Limited external rotation compared to the contralateral side. Normal internal rotation. Normal empty can test. Some pain with O'Brien's test. Right hand: Triggering of the ring finger. No redness or swelling. Right and left knee: No effusion. Normal range of motion. Neurovascularly intact     ASSESSMENT & PLAN:   Trigger ring finger of right hand Acutely occurring and worse in the mornings. -Provided splint. -Could consider injection.  Capsulitis of right shoulder Appears to be more of a capsular issue with movement.  Less likely for rotator cuff. -Counseled on home exercise therapy and supportive care. -Could consider imaging, injection or  therapy.  Effusion of both knee joints Has improved with Duexis and prednisone.  Also improving with working less. -Provided Duexis samples. -Counseled on home exercise therapy and supportive care. -If reoccurs may need to consider lab work.

## 2019-08-24 NOTE — Progress Notes (Signed)
Medication Samples have been provided to the patient.  Drug name: Duexis       Strength: 800mg /26.6mg         Qty: 2 Boxes  [id number]  Exp.Date: 06/2020  Dosing instructions: Take 1 tablet by mouth three (3) times a day.  The patient has been instructed regarding the correct time, dose, and frequency of taking this medication, including desired effects and most common side effects.   Sherrie George, MA 3:20 PM 08/24/2019

## 2019-08-24 NOTE — Patient Instructions (Signed)
Good to see you Please try the exercises for the shoulder. Please try heat before the exercises and ice after  Please try the splint on the finger for 3 weeks at night.  Please try the duexis as needed   Please send me a message in MyChart with any questions or updates.  Please see me back in 4-6 weeks.   --Dr. Raeford Razor

## 2019-08-24 NOTE — Assessment & Plan Note (Signed)
Has improved with Duexis and prednisone.  Also improving with working less. -Provided Duexis samples. -Counseled on home exercise therapy and supportive care. -If reoccurs may need to consider lab work.

## 2019-08-30 ENCOUNTER — Telehealth: Payer: Self-pay

## 2019-08-30 NOTE — Telephone Encounter (Signed)
Pt called advising has been having cramping off & on since Hysterectomy in 2018. [place] desk already scheduled her an Annual on 10/06/19 but pt wanted to see if she could get something sooner. Advised unfortunately that's the soonest that we can get her in the office. Pt verbalized understanding.

## 2019-09-21 ENCOUNTER — Ambulatory Visit (HOSPITAL_BASED_OUTPATIENT_CLINIC_OR_DEPARTMENT_OTHER)
Admission: RE | Admit: 2019-09-21 | Discharge: 2019-09-21 | Disposition: A | Discharge status: Home / Self Care | Payer: Medicaid Other | Source: Ambulatory Visit | Attending: Family Medicine | Admitting: Family Medicine

## 2019-09-21 ENCOUNTER — Ambulatory Visit: Payer: Self-pay

## 2019-09-21 ENCOUNTER — Other Ambulatory Visit: Payer: Self-pay

## 2019-09-21 ENCOUNTER — Encounter (HOSPITAL_BASED_OUTPATIENT_CLINIC_OR_DEPARTMENT_OTHER): Payer: Self-pay

## 2019-09-21 ENCOUNTER — Ambulatory Visit: Payer: Medicaid Other | Admitting: Family Medicine

## 2019-09-21 ENCOUNTER — Encounter: Payer: Self-pay | Admitting: Family Medicine

## 2019-09-21 VITALS — Ht 62.0 in

## 2019-09-21 DIAGNOSIS — M5412 Radiculopathy, cervical region: Secondary | ICD-10-CM | POA: Diagnosis not present

## 2019-09-21 DIAGNOSIS — M778 Other enthesopathies, not elsewhere classified: Secondary | ICD-10-CM | POA: Diagnosis not present

## 2019-09-21 MED ORDER — GABAPENTIN 100 MG PO CAPS: 100.0000 mg | ORAL_CAPSULE | Freq: Three times a day (TID) | ORAL | 1 refills | Status: DC

## 2019-09-21 MED ORDER — TRIAMCINOLONE ACETONIDE 40 MG/ML IJ SUSP
40.0000 mg | Freq: Once | INTRAMUSCULAR | Status: AC
  Administered 2019-09-21: 40 mg via INTRA_ARTICULAR

## 2019-09-21 MED FILL — GABAPENTIN 100 MG CAPSULE: 100 | 10 days supply | Qty: 30 | Fill #0

## 2019-09-21 NOTE — Progress Notes (Signed)
Christina Valenzuela - 44 y.o. female MRN [id number]  Date of birth: August 05, 1975  SUBJECTIVE:  Including CC & ROS.  Chief Complaint  Patient presents with  . Follow-up    right arm    Christina Valenzuela is a 44 y.o. female that is presenting with worsening of her right arm pain.  It seems to be extending from her neck down to her hand.  She has had pain waking her up in the middle the night.  Is worse with lifting and activity.   Review of Systems See HPI   HISTORY: Past Medical, Surgical, Social, and Family History Reviewed & Updated per EMR.   Pertinent Historical Findings include:  Past Medical History:  Diagnosis Date  . Depression     Past Surgical History:  Procedure Laterality Date  . ABDOMINAL HYSTERECTOMY  11/28/2016   [facility] - Dr Zigmund Daniel.  UTERINE LEIOMYOMA w/ MENORRHAGIA  . CESAREAN SECTION    . CESAREAN SECTION    . CHOLECYSTECTOMY N/A 01/05/2017   Procedure: LAPAROSCOPIC CHOLECYSTECTOMY WITH INTRAOPERATIVE CHOLANGIOGRAM;  Surgeon: Michael Boston, MD;  Location: [facility];  Service: General;  Laterality: N/A;  . EPIGASTRIC HERNIA REPAIR     with mesh.  Dr Excell Seltzer  . TUBAL LIGATION      Family History  Problem Relation Age of Onset  . Diabetes Maternal Grandfather     Social History   Socioeconomic History  . Marital status: Married    Spouse name: Not on file  . Number of children: 5  . Years of education: 53  . Highest education level: Not on file  Occupational History  . Not on file  Tobacco Use  . Smoking status: Current Every Day Smoker    Packs/day: 0.25    Types: Cigarettes  . Smokeless tobacco: Never Used  Substance and Sexual Activity  . Alcohol use: No    Alcohol/week: 0.0 standard drinks  . Drug use: No  . Sexual activity: Not on file  Other Topics Concern  . Not on file  Social History Narrative   Drinks caffeine    Social Determinants of Health   Financial Resource Strain:   . Difficulty of Paying Living Expenses:   Food Insecurity:    . Worried About Charity fundraiser in the Last Year:   . Arboriculturist in the Last Year:   Transportation Needs:   . Film/video editor (Medical):   Marland Kitchen Lack of Transportation (Non-Medical):   Physical Activity:   . Days of Exercise per Week:   . Minutes of Exercise per Session:   Stress:   . Feeling of Stress :   Social Connections:   . Frequency of Communication with Friends and Family:   . Frequency of Social Gatherings with Friends and Family:   . Attends Religious Services:   . Active Member of Clubs or Organizations:   . Attends Archivist Meetings:   Marland Kitchen Marital Status:   Intimate Partner Violence:   . Fear of Current or Ex-Partner:   . Emotionally Abused:   Marland Kitchen Physically Abused:   . Sexually Abused:      PHYSICAL EXAM:  VS: Ht 5\' 2"  (1.575 m)   LMP 08/31/2016   BMI 26.34 kg/m  Physical Exam Gen: NAD, alert, cooperative with exam, well-appearing MSK:  Neck/right shoulder: Normal neck range of motion. Normal shoulder flexion abduction. Some pain with external rotation and abduction. Some pain with internal rotation and abduction. Some pain with empty can testing.  Normal grip strength. Hand appears to be mildly swollen. Neurovascularly intact   Aspiration/Injection Procedure Note Christina Valenzuela 1975/12/20  Procedure: Injection Indications: Right shoulder pain  Procedure Details Consent: Risks of procedure as well as the alternatives and risks of each were explained to the (patient/caregiver).  Consent for procedure obtained. Time Out: Verified patient identification, verified procedure, site/side was marked, verified correct patient position, special equipment/implants available, medications/allergies/relevent history reviewed, required imaging and test results available.  Performed.  The area was cleaned with iodine and alcohol swabs.    The right glenohumeral joint was injected using 1 cc's of 40 mg Kenalog and 4 cc's of 0.25%  bupivacaine with a 21 2" needle.  Ultrasound was used. Images were obtained in short views showing the injection.     A sterile dressing was applied.  Patient did tolerate procedure well.    ASSESSMENT & PLAN:   Cervical radiculopathy Pain has been ongoing down her arm into the hand and seems radicular. -X-ray. -Gabapentin -Counseled on home exercise therapy and supportive care. -If no improvement will consider MRI.  Capsulitis of right shoulder Has pain that seems to resemble some capsular irritation as well.  Hard to discriminate between shoulder and neck origin of her pain. -Counseled on home exercise therapy and supportive care. -Glenohumeral injection. -Could consider physical therapy.

## 2019-09-21 NOTE — Assessment & Plan Note (Signed)
Pain has been ongoing down her arm into the hand and seems radicular. -X-ray. -Gabapentin -Counseled on home exercise therapy and supportive care. -If no improvement will consider MRI.

## 2019-09-21 NOTE — Assessment & Plan Note (Signed)
Has pain that seems to resemble some capsular irritation as well.  Hard to discriminate between shoulder and neck origin of her pain. -Counseled on home exercise therapy and supportive care. -Glenohumeral injection. -Could consider physical therapy.

## 2019-09-21 NOTE — Patient Instructions (Signed)
Good to see you Please try ice if needed  You can start the gabapentin later this week if the your symptoms are not improved. Please start with one pill at night. Gabapentin can make you sleepy. You can increase to 2 pills or 3 pills as you tolerate.  I will call with the results from today.   Please send me a message in MyChart with any questions or updates.  Please call me back in 2-3 weeks if the pain hasn't improve and we will need to order an xray of your neck.   --Dr. Raeford Razor

## 2019-09-22 ENCOUNTER — Ambulatory Visit (HOSPITAL_BASED_OUTPATIENT_CLINIC_OR_DEPARTMENT_OTHER)
Admission: RE | Admit: 2019-09-22 | Discharge: 2019-09-22 | Disposition: A | Discharge status: Home / Self Care | Payer: Medicaid Other | Source: Ambulatory Visit | Attending: Family Medicine | Admitting: Family Medicine

## 2019-09-22 DIAGNOSIS — M5412 Radiculopathy, cervical region: Secondary | ICD-10-CM | POA: Diagnosis not present

## 2019-09-22 DIAGNOSIS — M47812 Spondylosis without myelopathy or radiculopathy, cervical region: Secondary | ICD-10-CM | POA: Diagnosis not present

## 2019-09-26 ENCOUNTER — Telehealth: Payer: Self-pay | Admitting: Family Medicine

## 2019-09-26 NOTE — Telephone Encounter (Signed)
Unable to leave VM for patient. 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 She needs to let us know if her pain doesn't improve and we'll have to proceed with an MRI.   If any questions then please take the best time and phone number to call and I will try to call her back.   Rosemarie Ax, MD [facility] 09/26/2019, 9:32 AM

## 2019-10-06 ENCOUNTER — Telehealth: Payer: Self-pay

## 2019-10-06 ENCOUNTER — Ambulatory Visit: Payer: Medicaid Other | Admitting: Family Medicine

## 2019-10-06 DIAGNOSIS — B009 Herpesviral infection, unspecified: Secondary | ICD-10-CM

## 2019-10-06 MED ORDER — VALACYCLOVIR HCL 1 G PO TABS: 1000.0000 mg | ORAL_TABLET | Freq: Two times a day (BID) | ORAL | 0 refills | Status: AC

## 2019-10-06 NOTE — Telephone Encounter (Signed)
Pt called the office requesting a refill of Valtrex. 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 Per protocol, Valtrex 1000 mg BID x 5 days was sent to the pt's pharmacy. Understanding was voiced.  Murvin Gift l Jediah Horger, CMA

## 2019-10-12 ENCOUNTER — Ambulatory Visit (INDEPENDENT_AMBULATORY_CARE_PROVIDER_SITE_OTHER): Payer: Medicaid Other | Admitting: Obstetrics & Gynecology

## 2019-10-12 ENCOUNTER — Other Ambulatory Visit: Payer: Self-pay

## 2019-10-12 ENCOUNTER — Other Ambulatory Visit (HOSPITAL_COMMUNITY)
Admission: RE | Admit: 2019-10-12 | Discharge: 2019-10-12 | Disposition: A | Discharge status: Home / Self Care | Payer: Medicaid Other | Source: Ambulatory Visit | Attending: Obstetrics & Gynecology | Admitting: Obstetrics & Gynecology

## 2019-10-12 ENCOUNTER — Encounter: Payer: Self-pay | Admitting: Obstetrics & Gynecology

## 2019-10-12 ENCOUNTER — Other Ambulatory Visit: Payer: Self-pay | Admitting: Obstetrics & Gynecology

## 2019-10-12 DIAGNOSIS — Z1231 Encounter for screening mammogram for malignant neoplasm of breast: Secondary | ICD-10-CM

## 2019-10-12 DIAGNOSIS — Z113 Encounter for screening for infections with a predominantly sexual mode of transmission: Secondary | ICD-10-CM | POA: Insufficient documentation

## 2019-10-12 DIAGNOSIS — Z1211 Encounter for screening for malignant neoplasm of colon: Secondary | ICD-10-CM | POA: Diagnosis not present

## 2019-10-12 DIAGNOSIS — Z01419 Encounter for gynecological examination (general) (routine) without abnormal findings: Secondary | ICD-10-CM | POA: Insufficient documentation

## 2019-10-12 DIAGNOSIS — N898 Other specified noninflammatory disorders of vagina: Secondary | ICD-10-CM | POA: Diagnosis not present

## 2019-10-12 DIAGNOSIS — R399 Unspecified symptoms and signs involving the genitourinary system: Secondary | ICD-10-CM

## 2019-10-12 DIAGNOSIS — R921 Mammographic calcification found on diagnostic imaging of breast: Secondary | ICD-10-CM

## 2019-10-12 DIAGNOSIS — Z0000 Encounter for general adult medical examination without abnormal findings: Secondary | ICD-10-CM

## 2019-10-12 LAB — POCT URINALYSIS DIPSTICK
Protein, UA: NEGATIVE
Spec Grav, UA: 1.015 (ref 1.010–1.025)
pH, UA: 6.5 (ref 5.0–8.0)

## 2019-10-12 MED ORDER — SULFAMETHOXAZOLE-TRIMETHOPRIM 800-160 MG PO TABS: 1.0000 | ORAL_TABLET | Freq: Two times a day (BID) | ORAL | 0 refills | Status: DC

## 2019-10-12 NOTE — Patient Instructions (Signed)
GO WHITE: Soap: UNSCENTED Dove (white box light green writing) Laundry detergent (underwear)- Dreft or [employer] unscented WHITE 100% cotton panties (NOT just cotton crouch) Sanitary napkin/panty liners: UNSCENTED.  If it doesn't SAY unscented it can have a scent/perfume    NO PERFUMES OR LOTIONS OR POTIONS in the vulvar area (may use regular [place]) Condoms: hypoallergenic only. Non dyed (no color) Toilet papers: white only Wash clothes: use a separate wash cloth. WHITE.  Washed in Dreft.  

## 2019-10-12 NOTE — Progress Notes (Signed)
Subjective:     Christina Valenzuela is a 44 y.o. female here for a routine exam. [id number]  Pt is s/p hyst 2018 with Dr. [place] Pink. She reports that she has not had recent mammograms. She  Reports being very sensitive in the vulvar area. She reports intermittent odor and itching.      Gynecologic History Patient's last menstrual period was 08/31/2016. Contraception: status post hysterectomy Last Pap: 2018.  Last mammogram: 2018. Results were: bi rad 3 favor benign was to have diagnostic mammogram in 1 year. No f/u recorded.  Obstetric History OB History  Gravida Para Term Preterm AB Living  8 4 3 1 4 5   SAB TAB Ectopic Multiple Live Births  2 2 0 1 5    # Outcome Date GA Lbr Len/2nd Weight Sex Delivery Anes PTL Lv  8 Term 11/14/06 [redacted]w[redacted]d   F CS-LTranv EPI  LIV  7 Term 02/02/04 [redacted]w[redacted]d  6 lb (2.722 kg) M VBAC EPI  LIV  6 Term 04/27/92 [redacted]w[redacted]d   M Vag-Spont EPI  LIV  5 TAB         DEC  4 TAB         DEC  3 SAB         DEC  2 SAB         DEC  1A Preterm 08/04/96 [redacted]w[redacted]d  1 lb 4 oz (0.567 kg) F CS-LTranv EPI  LIV  1B Preterm    1 lb 5 oz (0.595 kg) F CS-LTranv EPI  LIV   The following portions of the patient's history were reviewed and updated as appropriate: allergies, current medications, past family history, past medical history, past social history, past surgical history and problem list.  Review of Systems Pertinent items are noted in HPI.    Objective:  BP 108/76   Pulse 89   Ht 5\' 2"  (1.575 m)   Wt 145 lb (65.8 kg)   LMP 08/31/2016   BMI 26.52 kg/m   General Appearance:    Alert, cooperative, no distress, appears stated age  Head:    Normocephalic, without obvious abnormality, atraumatic  Eyes:    conjunctiva/corneas clear, EOM's intact, both eyes  Ears:    Normal external ear canals, both ears  Nose:   Nares normal, septum midline, mucosa normal, no drainage    or sinus tenderness  Throat:   Lips, mucosa, and tongue normal; teeth and gums normal  Neck:   Supple,  symmetrical, trachea midline, no adenopathy;    thyroid:  no enlargement/tenderness/nodules  Back:     Symmetric, no curvature, ROM normal, no CVA tenderness  Lungs:     respirations unlabored  Chest Wall:    No tenderness or deformity   Heart:    Regular rate and rhythm  Breast Exam:    No tenderness, masses, or nipple abnormality  Abdomen:     Soft, non-tender, bowel sounds active all four quadrants,    no masses, no organomegaly  Genitalia:    Normal female without lesion, discharge or tenderness     Extremities:   Extremities normal, atraumatic, no cyanosis or edema  Pulses:   2+ and symmetric all extremities  Skin:   Skin color, texture, turgor normal, no rashes or lesions    Assessment:    Healthy female exam.   Vaginal discharge  STI screen UTI   Plan:   Bilateral diagnostic mammogram   f/u in 6 weeks Reviewed GO WHITE info F/u cx and wet mount  and yeast on Affirm Bactrim DS 1 po bid x 5 days  [name] L. Ihor Dow, M.D., [place]  .

## 2019-10-13 LAB — HEPATITIS C ANTIBODY: Hep C Virus Ab: <0.1 s/co ratio (ref 0.0–0.9)

## 2019-10-13 LAB — HIV ANTIBODY (ROUTINE TESTING W REFLEX): HIV Screen 4th Generation wRfx: NONREACTIVE

## 2019-10-13 LAB — HEPATITIS B SURFACE ANTIGEN: Hepatitis B Surface Ag: NEGATIVE

## 2019-10-13 LAB — RPR: RPR Ser Ql: NONREACTIVE

## 2019-10-14 LAB — CERVICOVAGINAL ANCILLARY ONLY
Bacterial Vaginitis (gardnerella): POSITIVE — AB
Candida Glabrata: NEGATIVE
Candida Vaginitis: NEGATIVE
Chlamydia: NEGATIVE
Comment: NEGATIVE
Comment: NEGATIVE
Comment: NEGATIVE
Comment: NEGATIVE
Comment: NEGATIVE
Comment: NORMAL
Neisseria Gonorrhea: NEGATIVE
Trichomonas: NEGATIVE

## 2019-10-15 LAB — URINE CULTURE

## 2019-10-17 ENCOUNTER — Other Ambulatory Visit: Payer: Self-pay | Admitting: Obstetrics & Gynecology

## 2019-10-17 ENCOUNTER — Telehealth: Payer: Self-pay

## 2019-10-17 DIAGNOSIS — N760 Acute vaginitis: Secondary | ICD-10-CM

## 2019-10-17 MED ORDER — METRONIDAZOLE 0.75 % VA GEL: VAGINAL | 0 refills | Status: DC

## 2019-10-17 NOTE — Telephone Encounter (Signed)
Called pt to discuss positive BV results. Pt made aware that she has BV.Pt states she prefers the Metrogel. Medication was sent to pt's pharmacy. Understanding was voiced. Teagen Bucio l Aalaysia Liggins, CMA

## 2019-10-19 ENCOUNTER — Other Ambulatory Visit: Payer: Self-pay

## 2019-10-19 ENCOUNTER — Other Ambulatory Visit: Payer: Self-pay | Admitting: Obstetrics & Gynecology

## 2019-10-19 ENCOUNTER — Ambulatory Visit
Admission: RE | Admit: 2019-10-19 | Discharge: 2019-10-19 | Disposition: A | Discharge status: Home / Self Care | Payer: Medicaid Other | Source: Ambulatory Visit | Attending: Obstetrics & Gynecology | Admitting: Obstetrics & Gynecology

## 2019-10-19 DIAGNOSIS — R921 Mammographic calcification found on diagnostic imaging of breast: Secondary | ICD-10-CM

## 2020-01-17 ENCOUNTER — Encounter: Payer: Self-pay | Admitting: Advanced Practice Midwife

## 2020-01-17 ENCOUNTER — Ambulatory Visit (INDEPENDENT_AMBULATORY_CARE_PROVIDER_SITE_OTHER): Payer: Medicaid Other | Admitting: Advanced Practice Midwife

## 2020-01-17 ENCOUNTER — Other Ambulatory Visit (HOSPITAL_COMMUNITY)
Admission: RE | Admit: 2020-01-17 | Discharge: 2020-01-17 | Disposition: A | Discharge status: Home / Self Care | Payer: Medicaid Other | Source: Ambulatory Visit | Attending: Advanced Practice Midwife | Admitting: Advanced Practice Midwife

## 2020-01-17 ENCOUNTER — Other Ambulatory Visit: Payer: Self-pay

## 2020-01-17 VITALS — BP 120/88 | HR 88 | Wt 146.0 lb

## 2020-01-17 DIAGNOSIS — R599 Enlarged lymph nodes, unspecified: Secondary | ICD-10-CM

## 2020-01-17 DIAGNOSIS — R590 Localized enlarged lymph nodes: Secondary | ICD-10-CM | POA: Diagnosis not present

## 2020-01-17 DIAGNOSIS — Z113 Encounter for screening for infections with a predominantly sexual mode of transmission: Secondary | ICD-10-CM | POA: Diagnosis not present

## 2020-01-17 DIAGNOSIS — R319 Hematuria, unspecified: Secondary | ICD-10-CM | POA: Diagnosis not present

## 2020-01-17 DIAGNOSIS — N898 Other specified noninflammatory disorders of vagina: Secondary | ICD-10-CM | POA: Insufficient documentation

## 2020-01-17 LAB — POCT URINALYSIS DIPSTICK
Glucose, UA: NEGATIVE
Ketones, UA: NEGATIVE
Leukocytes, UA: NEGATIVE
Nitrite, UA: NEGATIVE
Spec Grav, UA: 1.02 (ref 1.010–1.025)
pH, UA: 6.5 (ref 5.0–8.0)

## 2020-01-17 MED ORDER — TERCONAZOLE 0.4 % VA CREA: 1.0000 | TOPICAL_CREAM | Freq: Every day | VAGINAL | 0 refills | Status: DC

## 2020-01-17 MED ORDER — VALACYCLOVIR HCL 1 G PO TABS: 1000.0000 mg | ORAL_TABLET | Freq: Two times a day (BID) | ORAL | 3 refills | Status: DC

## 2020-01-17 MED ORDER — FLUCONAZOLE 150 MG PO TABS: 150.0000 mg | ORAL_TABLET | ORAL | 3 refills | Status: DC

## 2020-01-17 MED FILL — valACYclovir HCL 1 GM TABS: 1 | 10 days supply | Qty: 20 | Fill #0

## 2020-01-17 MED FILL — TERCONAZOLE 0.4% CREAM: 0.4 | 14 days supply | Qty: 45 | Fill #0

## 2020-01-17 MED FILL — FLUCONAZOLE 150 MG TABS: 150 | 1 days supply | Qty: 1 | Fill #0

## 2020-01-17 NOTE — Progress Notes (Signed)
   Subjective:    Patient ID: Christina Valenzuela, female    DOB: 1975-07-03, 44 y.o.   MRN: [id number]  Vaginal Discharge The patient's primary symptoms include genital itching and vaginal discharge. The patient's pertinent negatives include no genital lesions, genital odor, pelvic pain or vaginal bleeding. This is a recurrent problem. The current episode started in the past 7 days. The problem occurs constantly. The problem has been unchanged. The problem affects both sides. She is not pregnant. Pertinent negatives include no abdominal pain, back pain, chills, constipation, diarrhea, dysuria, fever, frequency or nausea. The vaginal discharge was normal. There has been no bleeding. She has not been passing clots. She has not been passing tissue. Nothing aggravates the symptoms. She has tried nothing for the symptoms. She uses hysterectomy for contraception.   Also has noted a new enlarged lymph node left groin.  Had one on right few weeks ago that resolved.  Had a "boil" on mons which she opened and drained.  Worried it is syphilis.  Monogamous with husband but wants to be checked for everything  Review of Systems  Constitutional: Negative for chills and fever.  Gastrointestinal: Negative for abdominal pain, constipation, diarrhea and nausea.  Genitourinary: Positive for vaginal discharge. Negative for dysuria, frequency and pelvic pain.       Itching and irritation   Musculoskeletal: Negative for back pain.       Objective:   Physical Exam Vitals reviewed.  Constitutional:      General: She is not in acute distress.    Appearance: Normal appearance. She is not toxic-appearing.  HENT:     Head: Normocephalic.  Cardiovascular:     Rate and Rhythm: Normal rate.  Pulmonary:     Effort: Pulmonary effort is normal. No respiratory distress.  Abdominal:     General: There is no distension.     Palpations: Abdomen is soft.     Tenderness: There is no abdominal tenderness. There is no  guarding or rebound.  Genitourinary:    General: Normal vulva.     Comments: No erethema or lesions noted.  Small amount of white curdlike discharge. Sent for testing.  Cervix surgically absent.  No pelvic tenderness  1 cm lymph node on left groin, nontender Musculoskeletal:     Cervical back: Normal range of motion.  Skin:    General: Skin is warm and dry.  Neurological:     General: No focal deficit present.     Mental Status: She is alert.  Psychiatric:        Mood and Affect: Mood normal.        Behavior: Behavior normal.    Microscopic hematuria on UA, sent for culture       Assessment & Plan:  Vaginal irritation/itching Probable yeast infection Enlarged lymph node Microscopic hematuria  Cultures sent Will redraw blood per pt request for HIV, Hep, RPR, HIV Urine to culture Treat if indicated Rx diflucan prn itching Rx Terazol for external application for yeast If lymph node does not resolve, will refer for biopsy, but likely due to inflammation and possible UTI (consulted Dr Roselie Awkward who agrees)  Time spent in face to face exam and interaction: 10 minutes

## 2020-01-17 NOTE — Patient Instructions (Signed)
Vaginitis Vaginitis is a condition in which the vaginal tissue swells and becomes red (inflamed). This condition is most often caused by a change in the normal balance of bacteria and yeast that live in the vagina. This change causes an overgrowth of certain bacteria or yeast, which causes the inflammation. There are different types of vaginitis, but the most common types are:  Bacterial vaginosis.  Yeast infection (candidiasis).  Trichomoniasis vaginitis. This is a sexually transmitted disease (STD).  Viral vaginitis.  Atrophic vaginitis.  Allergic vaginitis. What are the causes? The cause of this condition depends on the type of vaginitis. It can be caused by:  Bacteria (bacterial vaginosis).  Yeast, which is a fungus (yeast infection).  A parasite (trichomoniasis vaginitis).  A virus (viral vaginitis).  Low hormone levels (atrophic vaginitis). Low hormone levels can occur during pregnancy, breastfeeding, or after menopause.  Irritants, such as bubble baths, scented tampons, and feminine sprays (allergic vaginitis). Other factors can change the normal balance of the yeast and bacteria that live in the vagina. These include:  Antibiotic medicines.  Poor hygiene.  Diaphragms, vaginal sponges, spermicides, birth control pills, and intrauterine devices (IUD).  Sex.  Infection.  Uncontrolled diabetes.  A weakened defense (immune) system. What increases the risk? This condition is more likely to develop in women who:  Smoke.  Use vaginal douches, scented tampons, or scented sanitary pads.  Wear tight-fitting pants.  Wear thong underwear.  Use oral birth control pills or an IUD.  Have sex without a condom.  Have multiple sex partners.  Have an STD.  Frequently use the spermicide nonoxynol-9.  Eat lots of foods high in sugar.  Have uncontrolled diabetes.  Have low estrogen levels.  Have a weakened immune system from an immune disorder or medical  treatment.  Are pregnant or breastfeeding. What are the signs or symptoms? Symptoms vary depending on the cause of the vaginitis. Common symptoms include:  Abnormal vaginal discharge. ? The discharge is white, gray, or yellow with bacterial vaginosis. ? The discharge is thick, white, and cheesy with a yeast infection. ? The discharge is frothy and yellow or greenish with trichomoniasis.  A bad vaginal smell. The smell is fishy with bacterial vaginosis.  Vaginal itching, pain, or swelling.  Sex that is painful.  Pain or burning when urinating. Sometimes there are no symptoms. How is this diagnosed? This condition is diagnosed based on your symptoms and medical history. A physical exam, including a pelvic exam, will also be done. You may also have other tests, including:  Tests to determine the pH level (acidity or alkalinity) of your vagina.  A whiff test, to assess the odor that results when a sample of your vaginal discharge is mixed with a potassium hydroxide solution.  Tests of vaginal fluid. A sample will be examined under a microscope. How is this treated? Treatment varies depending on the type of vaginitis you have. Your treatment may include:  Antibiotic creams or pills to treat bacterial vaginosis and trichomoniasis.  Antifungal medicines, such as vaginal creams or suppositories, to treat a yeast infection.  Medicine to ease discomfort if you have viral vaginitis. Your sexual partner should also be treated.  Estrogen delivered in a cream, pill, suppository, or vaginal ring to treat atrophic vaginitis. If vaginal dryness occurs, lubricants and moisturizing creams may help. You may need to avoid scented soaps, sprays, or douches.  Stopping use of a product that is causing allergic vaginitis. Then using a vaginal cream to treat the symptoms. Follow   these instructions at home: Lifestyle  Keep your genital area clean and dry. Avoid soap, and only rinse the area with  water.  Do not douche or use tampons until your health care provider says it is okay to do so. Use sanitary pads, if needed.  Do not have sex until your health care provider approves. When you can return to sex, practice safe sex and use condoms.  Wipe from front to back. This avoids the spread of bacteria from the rectum to the vagina. General instructions  Take over-the-counter and prescription medicines only as told by your health care provider.  If you were prescribed an antibiotic medicine, take or use it as told by your health care provider. Do not stop taking or using the antibiotic even if you start to feel better.  Keep all follow-up visits as told by your health care provider. This is important. How is this prevented?  Use mild, non-scented products. Do not use things that can irritate the vagina, such as fabric softeners. Avoid the following products if they are scented: ? Feminine sprays. ? Detergents. ? Tampons. ? Feminine hygiene products. ? Soaps or bubble baths.  Let air reach your genital area. ? Wear cotton underwear to reduce moisture buildup. ? Avoid wearing underwear while you sleep. ? Avoid wearing tight pants and underwear or nylons without a cotton panel. ? Avoid wearing thong underwear.  Take off any wet clothing, such as bathing suits, as soon as possible.  Practice safe sex and use condoms. Contact a health care provider if:  You have abdominal pain.  You have a fever.  You have symptoms that last for more than 2-3 days. Get help right away if:  You have a fever and your symptoms suddenly get worse. Summary  Vaginitis is a condition in which the vaginal tissue becomes inflamed.This condition is most often caused by a change in the normal balance of bacteria and yeast that live in the vagina.  Treatment varies depending on the type of vaginitis you have.  Do not douche, use tampons , or have sex until your health care provider approves. When  you can return to sex, practice safe sex and use condoms. This information is not intended to replace advice given to you by your health care provider. Make sure you discuss any questions you have with your health care provider. Document Revised: 04/03/2017 Document Reviewed: 05/27/2016 Elsevier Patient Education  2020 [employer]  

## 2020-01-18 LAB — CERVICOVAGINAL ANCILLARY ONLY
Bacterial Vaginitis (gardnerella): NEGATIVE
Candida Glabrata: NEGATIVE
Candida Vaginitis: NEGATIVE
Chlamydia: NEGATIVE
Comment: NEGATIVE
Comment: NEGATIVE
Comment: NEGATIVE
Comment: NEGATIVE
Comment: NEGATIVE
Comment: NORMAL
Neisseria Gonorrhea: NEGATIVE
Trichomonas: NEGATIVE

## 2020-01-18 LAB — URINALYSIS
Bilirubin, UA: NEGATIVE
Glucose, UA: NEGATIVE
Ketones, UA: NEGATIVE
Leukocytes,UA: NEGATIVE
Nitrite, UA: NEGATIVE
Protein,UA: NEGATIVE
RBC, UA: NEGATIVE
Specific Gravity, UA: 1.025 (ref 1.005–1.030)
Urobilinogen, Ur: 0.2 mg/dL (ref 0.2–1.0)
pH, UA: 5.5 (ref 5.0–7.5)

## 2020-01-18 LAB — HIV ANTIBODY (ROUTINE TESTING W REFLEX): HIV Screen 4th Generation wRfx: NONREACTIVE

## 2020-01-18 LAB — HEPATITIS B SURFACE ANTIGEN: Hepatitis B Surface Ag: NEGATIVE

## 2020-01-18 LAB — RPR: RPR Ser Ql: NONREACTIVE

## 2020-01-18 LAB — HEPATITIS C ANTIBODY: Hep C Virus Ab: <0.1 s/co ratio (ref 0.0–0.9)

## 2020-01-19 LAB — URINE CULTURE

## 2020-02-27 DIAGNOSIS — Z20822 Contact with and (suspected) exposure to covid-19: Secondary | ICD-10-CM | POA: Diagnosis not present

## 2020-02-28 ENCOUNTER — Other Ambulatory Visit: Payer: Self-pay

## 2020-02-28 ENCOUNTER — Emergency Department (HOSPITAL_BASED_OUTPATIENT_CLINIC_OR_DEPARTMENT_OTHER)
Admission: EM | Admit: 2020-02-28 | Discharge: 2020-02-28 | Disposition: A | Discharge status: Home / Self Care | Payer: Medicaid Other | Attending: Emergency Medicine | Admitting: Emergency Medicine

## 2020-02-28 ENCOUNTER — Emergency Department (HOSPITAL_BASED_OUTPATIENT_CLINIC_OR_DEPARTMENT_OTHER): Payer: Medicaid Other

## 2020-02-28 ENCOUNTER — Encounter (HOSPITAL_BASED_OUTPATIENT_CLINIC_OR_DEPARTMENT_OTHER): Payer: Self-pay | Admitting: Emergency Medicine

## 2020-02-28 DIAGNOSIS — R0789 Other chest pain: Secondary | ICD-10-CM | POA: Diagnosis not present

## 2020-02-28 DIAGNOSIS — F17210 Nicotine dependence, cigarettes, uncomplicated: Secondary | ICD-10-CM | POA: Diagnosis not present

## 2020-02-28 DIAGNOSIS — Z91040 Latex allergy status: Secondary | ICD-10-CM | POA: Diagnosis not present

## 2020-02-28 DIAGNOSIS — J069 Acute upper respiratory infection, unspecified: Secondary | ICD-10-CM | POA: Insufficient documentation

## 2020-02-28 DIAGNOSIS — R5383 Other fatigue: Secondary | ICD-10-CM | POA: Diagnosis not present

## 2020-02-28 DIAGNOSIS — Z20822 Contact with and (suspected) exposure to covid-19: Secondary | ICD-10-CM | POA: Diagnosis not present

## 2020-02-28 DIAGNOSIS — R509 Fever, unspecified: Secondary | ICD-10-CM | POA: Diagnosis present

## 2020-02-28 DIAGNOSIS — B9689 Other specified bacterial agents as the cause of diseases classified elsewhere: Secondary | ICD-10-CM | POA: Diagnosis not present

## 2020-02-28 DIAGNOSIS — B379 Candidiasis, unspecified: Secondary | ICD-10-CM

## 2020-02-28 DIAGNOSIS — R079 Chest pain, unspecified: Secondary | ICD-10-CM | POA: Insufficient documentation

## 2020-02-28 DIAGNOSIS — J0110 Acute frontal sinusitis, unspecified: Secondary | ICD-10-CM | POA: Insufficient documentation

## 2020-02-28 LAB — RESPIRATORY PANEL BY RT PCR (FLU A&B, COVID)
Influenza A by PCR: NEGATIVE
Influenza B by PCR: NEGATIVE
SARS Coronavirus 2 by RT PCR: NEGATIVE

## 2020-02-28 MED ORDER — AMOXICILLIN-POT CLAVULANATE 875-125 MG PO TABS
1.0000 | ORAL_TABLET | Freq: Once | ORAL | Status: AC
  Administered 2020-02-28: 1 via ORAL
  Filled 2020-02-28: qty 1

## 2020-02-28 MED ORDER — FLUCONAZOLE 150 MG PO TABS: ORAL_TABLET | ORAL | 1 refills | Status: DC

## 2020-02-28 MED ORDER — ONDANSETRON 4 MG PO TBDP: ORAL_TABLET | ORAL | 0 refills | Status: DC

## 2020-02-28 MED ORDER — AMOXICILLIN-POT CLAVULANATE 875-125 MG PO TABS: 1.0000 | ORAL_TABLET | Freq: Two times a day (BID) | ORAL | 0 refills | Status: DC

## 2020-02-28 MED ORDER — FLUCONAZOLE 150 MG PO TABS: 150.0000 mg | ORAL_TABLET | ORAL | 1 refills | Status: DC

## 2020-02-28 MED ORDER — BENZONATATE 100 MG PO CAPS: 100.0000 mg | ORAL_CAPSULE | Freq: Three times a day (TID) | ORAL | 0 refills | Status: DC

## 2020-02-28 MED FILL — BENZONATATE 100 MG CAPS: 100 | 7 days supply | Qty: 21 | Fill #0

## 2020-02-28 MED FILL — ONDANSETRON ODT 4 MG TABLET: 4 | 3 days supply | Qty: 20 | Fill #0

## 2020-02-28 MED FILL — AMOX-CLAV 875-125 MG TABLET: 875-125 | 7 days supply | Qty: 14 | Fill #0

## 2020-02-28 MED FILL — FLUCONAZOLE 150 MG TABS: 150 | 1 days supply | Qty: 1 | Fill #0

## 2020-02-28 NOTE — Discharge Instructions (Addendum)
Take tylenol 2 pills 4 times a day and motrin 4 pills 3 times a day.  Drink plenty of fluids.  Return for worsening shortness of breath, confusion. Follow up with your family doctor.   

## 2020-02-28 NOTE — ED Triage Notes (Signed)
Pt states on Friday she started to feel fatigue.  Worsened over last few days.  Some nausea, some left sided neck pain, some chest pain.  Pt believes she had a fever last night.  No known sick exposure.

## 2020-02-28 NOTE — ED Provider Notes (Signed)
[facility] Provider Note   CSN: [id number] Arrival date & time: 02/28/20  4034     History Chief Complaint  Patient presents with  . Fatigue  . Chest Pain    Christina Valenzuela is a 44 y.o. female.  44 yo F with a chief complaints of subjective fevers and chills and feeling unwell.  Going on for about 48 hours now.  Went to a urgent care and had a Covid test.  She is unsure of the results.  Denies significant cough.  Denies abdominal pain nausea or vomiting.  She feels like her lymph nodes are swollen worse in her neck.  Denies sore throat.  Was having some pain to the left side of her neck that felt she had slept on it wrong and then started feeling pain in her left side of her chest.  Worse with movement twisting deep breathing.  The history is provided by the patient.  Chest Pain Pain location:  L chest Pain quality: sharp   Pain radiates to:  Does not radiate Pain severity:  Moderate Onset quality:  Gradual Duration:  2 days Timing:  Intermittent Progression:  Waxing and waning Chronicity:  New Context: breathing, movement and raising an arm   Relieved by:  Nothing Worsened by:  Deep breathing, certain positions and movement Ineffective treatments:  None tried Associated symptoms: no abdominal pain, no dizziness, no fever, no headache, no nausea, no palpitations, no shortness of breath and no vomiting        Past Medical History:  Diagnosis Date  . Depression     Patient Active Problem List   Diagnosis Date Noted  . Enlarged lymph node 01/17/2020  . Capsulitis of right shoulder 08/24/2019  . Trigger ring finger of right hand 08/24/2019  . Effusion of both knee joints 07/13/2019  . Cervical radiculopathy 07/13/2019  . Pelvic pain 07/29/2018  . Acute on chronic cholecystitis s/p lap cholecystectomy 01/05/2017 01/05/2017  . MRSA (methicillin resistant Staphylococcus aureus) colonization 01/05/2017  . Mixed incontinence 10/24/2016    . Chronic anemia 09/08/2015  . Pica 09/08/2015  . Primary insomnia 08/22/2015  . Depression with anxiety 08/22/2015  . Alcohol abuse 08/22/2015  . Headache 08/22/2015  . UTI symptoms 02/16/2014  . 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    Past Surgical History:  Procedure Laterality Date  . ABDOMINAL HYSTERECTOMY  11/28/2016   [facility] - Dr Zigmund Daniel.  UTERINE LEIOMYOMA w/ MENORRHAGIA  . CESAREAN SECTION    . CESAREAN SECTION    . CHOLECYSTECTOMY N/A 01/05/2017   Procedure: LAPAROSCOPIC CHOLECYSTECTOMY WITH INTRAOPERATIVE CHOLANGIOGRAM;  Surgeon: Michael Boston, MD;  Location: [facility];  Service: General;  Laterality: N/A;  . EPIGASTRIC HERNIA REPAIR     with mesh.  Dr Excell Seltzer  . TUBAL LIGATION       OB History    Gravida  8   Para  4   Term  3   Preterm  1   AB  4   Living  5     SAB  2   TAB  2   Ectopic  0   Multiple  1   Live Births  5           Family History  Problem Relation Age of Onset  . Diabetes Maternal Grandfather     Social History   Tobacco Use  . Smoking status: Current Every Day Smoker    Packs/day: 0.25    Types: Cigarettes  . Smokeless tobacco: Never Used  Vaping Use  . Vaping Use: Never used  Substance Use Topics  . Alcohol use: No    Alcohol/week: 0.0 standard drinks  . Drug use: No    Home Medications Prior to Admission medications   Medication Sig Start Date End Date Taking? Authorizing Provider  amoxicillin-clavulanate (AUGMENTIN) 875-125 MG tablet Take 1 tablet by mouth 2 (two) times daily. One po bid x 7 days 02/28/20   Deno Etienne, DO  benzonatate (TESSALON) 100 MG capsule Take 1 capsule (100 mg total) by mouth every 8 (eight) hours. 02/28/20   Deno Etienne, DO  diphenhydrAMINE (BENADRYL) 25 MG tablet Take 50 mg by mouth at bedtime as needed.    [provider]  fluconazole (DIFLUCAN) 150 MG tablet Take 1 tablet (150 mg total) by mouth once a week. Repeat only if symptoms persist 01/17/20   Seabron Spates, CNM   gabapentin (NEURONTIN) 100 MG capsule Take 1 capsule (100 mg total) by mouth 3 (three) times daily. Patient not taking: Reported on 10/12/2019 09/21/19   Rosemarie Ax, MD  metroNIDAZOLE (METROGEL VAGINAL) 0.75 % vaginal gel Insert 1 applicator vaginally every night for 5 nights. Patient not taking: Reported on 01/17/2020 10/17/19   Lavonia Drafts, MD  ondansetron [facility] ODT) 4 MG disintegrating tablet 4mg  ODT q4 hours prn nausea/vomit 02/28/20   Deno Etienne, DO  phenazopyridine (PYRIDIUM) 97 MG tablet Take 97 mg by mouth 3 (three) times daily as needed for pain. Patient not taking: Reported on 01/17/2020    [provider]  predniSONE (DELTASONE) 5 MG tablet Take 6 pills for first day, 5 pills second day, 4 pills third day, 3 pills fourth day, 2 pills the fifth day, and 1 pill sixth day. Patient not taking: Reported on 10/12/2019 07/13/19   Rosemarie Ax, MD  sulfamethoxazole-trimethoprim (BACTRIM DS) 800-160 MG tablet Take 1 tablet by mouth 2 (two) times daily. Patient not taking: Reported on 01/17/2020 10/12/19   Lavonia Drafts, MD  terconazole (TERAZOL 7) 0.4 % vaginal cream Place 1 applicator vaginally at bedtime. 01/17/20   Seabron Spates, CNM  valACYclovir (VALTREX) 1000 MG tablet Take 1 tablet (1,000 mg total) by mouth 2 (two) times daily. Take for ten days. 01/17/20   Seabron Spates, CNM    Allergies    Hydrocodone-acetaminophen and Latex  Review of Systems   Review of Systems  Constitutional: Negative for chills and fever.  HENT: Positive for congestion, postnasal drip, rhinorrhea and sinus pressure. Negative for ear pain and sore throat.   Eyes: Negative for redness and visual disturbance.  Respiratory: Negative for shortness of breath and wheezing.   Cardiovascular: Positive for chest pain. Negative for palpitations.  Gastrointestinal: Negative for abdominal pain, nausea and vomiting.  Genitourinary: Negative for dysuria and urgency.   Musculoskeletal: Negative for arthralgias and myalgias.  Skin: Negative for pallor and wound.  Neurological: Negative for dizziness and headaches.    Physical Exam Updated Vital Signs BP 117/81   Pulse 99   Temp 98.2 F (36.8 C)   Resp 16   Ht 5\' 2"  (1.575 m)   Wt 65.8 kg   LMP 08/31/2016   SpO2 97%   BMI 26.52 kg/m   Physical Exam Vitals and nursing note reviewed.  Constitutional:      General: She is not in acute distress.    Appearance: She is well-developed. She is not diaphoretic.  HENT:     Head: Normocephalic and atraumatic.     Comments: Swollen turbinates, posterior nasal  drip, left frontal sinus is exquisitely tender to percussion, tm normal bilaterally.   Eyes:     Pupils: Pupils are equal, round, and reactive to light.  Cardiovascular:     Rate and Rhythm: Normal rate and regular rhythm.     Heart sounds: No murmur heard.  No friction rub. No gallop.   Pulmonary:     Effort: Pulmonary effort is normal.     Breath sounds: No wheezing or rales.  Chest:     Chest wall: Tenderness present.     Comments: Tender to palpation about the left anterior chest wall about the medial clavicular line ribs 2 through 4.  Reproduces the patient's symptoms.  She has pain along the trapezius which reproduces her neck pain.  No midline spinal tenderness.  Able to rotate her head without issue.  Able to touch her chin to her chest. Abdominal:     General: There is no distension.     Palpations: Abdomen is soft.     Tenderness: There is no abdominal tenderness.  Musculoskeletal:        General: No tenderness.     Cervical back: Normal range of motion and neck supple.  Lymphadenopathy:     Cervical: Cervical adenopathy present.  Skin:    General: Skin is warm and dry.  Neurological:     Mental Status: She is alert and oriented to person, place, and time.  Psychiatric:        Behavior: Behavior normal.     ED Results / Procedures / Treatments   Labs (all labs ordered  are listed, but only abnormal results are displayed) Labs Reviewed  RESPIRATORY PANEL BY RT PCR (FLU A&B, COVID)    EKG EKG Interpretation  Date/Time:  Tuesday February 28 2020 10:18:44 EDT Ventricular Rate:  90 PR Interval:    QRS Duration: 83 QT Interval:  356 QTC Calculation: 436 R Axis:   -12 Text Interpretation: Sinus rhythm Low voltage, precordial leads Anteroseptal infarct, old No significant change since last tracing Confirmed by Deno Etienne [phone]) on 02/28/2020 10:28:29 AM   Radiology DG Chest Port 1 View  Result Date: 02/28/2020 CLINICAL DATA:  Worsening fatigue since last Friday, nausea, LEFT neck and chest pain EXAM: PORTABLE CHEST 1 VIEW COMPARISON:  Portable exam 1003 hours compared to 12/01/2018 FINDINGS: Normal heart size, mediastinal contours, and pulmonary vascularity. Lungs clear. No infiltrate, pleural effusion, or pneumothorax. Osseous structures unremarkable. IMPRESSION: No acute abnormalities. Electronically Signed   By: Lavonia Dana M.D.   On: 02/28/2020 10:25    Procedures Procedures (including critical care time)  Medications Ordered in ED Medications  amoxicillin-clavulanate (AUGMENTIN) 875-125 MG per tablet 1 tablet (1 tablet Oral Given 02/28/20 1007)    ED Course  I have reviewed the triage vital signs and the nursing notes.  Pertinent labs & imaging results that were available during my care of the patient were reviewed by me and considered in my medical decision making (see chart for details).    MDM Rules/Calculators/A&P                          44 yo F with a chief complaints of feeling unwell and having some left-sided neck pain and anterior chest pain.  This pain is reproduced on palpation.  Seems muscular in etiology.  Chest x-ray viewed by me without focal infiltrate or pneumothorax.  EKG without concerning change.  Most likely patient has a viral syndrome.  She does  have lymphadenopathy in the neck, has tenderness to percussion to the left  frontal sinus.  Will treat with a course of antibiotics.  PCP follow-up.  10:39 AM:  I have discussed the diagnosis/risks/treatment options with the patient and believe the pt to be eligible for discharge home to follow-up with PCP. We also discussed returning to the ED immediately if new or worsening sx occur. We discussed the sx which are most concerning (e.g., sudden worsening pain, fever, inability to tolerate by mouth) that necessitate immediate return. Medications administered to the patient during their visit and any new prescriptions provided to the patient are listed below.  Medications given during this visit Medications  amoxicillin-clavulanate (AUGMENTIN) 875-125 MG per tablet 1 tablet (1 tablet Oral Given 02/28/20 1007)     The patient appears reasonably screen and/or stabilized for discharge and I doubt any other medical condition or other [facility] requiring further screening, evaluation, or treatment in the ED at this time prior to discharge.   Final Clinical Impression(s) / ED Diagnoses Final diagnoses:  Viral URI  Acute frontal sinusitis, recurrence not specified    Rx / DC Orders ED Discharge Orders         Ordered    benzonatate (TESSALON) 100 MG capsule  Every 8 hours        02/28/20 1034    amoxicillin-clavulanate (AUGMENTIN) 875-125 MG tablet  2 times daily        02/28/20 1034    ondansetron (ZOFRAN ODT) 4 MG disintegrating tablet        02/28/20 1034           Deno Etienne, DO 02/28/20 1039

## 2020-03-06 ENCOUNTER — Ambulatory Visit (INDEPENDENT_AMBULATORY_CARE_PROVIDER_SITE_OTHER): Payer: Medicaid Other

## 2020-03-06 VITALS — BP 123/78 | HR 96

## 2020-03-06 DIAGNOSIS — R300 Dysuria: Secondary | ICD-10-CM

## 2020-03-06 LAB — POCT URINALYSIS DIPSTICK
Glucose, UA: NEGATIVE
Nitrite, UA: NEGATIVE
Protein, UA: NEGATIVE
Urobilinogen, UA: 0.2 E.U./dL
pH, UA: 6 (ref 5.0–8.0)

## 2020-03-06 MED ORDER — NITROFURANTOIN MONOHYD MACRO 100 MG PO CAPS: 100.0000 mg | ORAL_CAPSULE | Freq: Two times a day (BID) | ORAL | 0 refills | Status: DC

## 2020-03-06 NOTE — Progress Notes (Addendum)
Patient presented with painful urination. Patient urine dip revealed blood and leukocytes. Patient made aware that we will send in antibiotic for her to start and culture the urine. Patient made aware that she can still try AZo over the counter for the discomfort and increase water intake. Kathrene Alu RN   Attestation of Attending Supervision of RN: Evaluation and management procedures were performed by the nurse under my supervision and collaboration.  I have reviewed the nursing note and chart, and I agree with the management and plan.  Carolyn L. Harraway-Smith, M.D., Cherlynn June

## 2020-03-09 LAB — URINE CULTURE

## 2020-03-14 ENCOUNTER — Other Ambulatory Visit: Payer: Self-pay | Admitting: Obstetrics & Gynecology

## 2020-05-16 ENCOUNTER — Inpatient Hospital Stay: Admission: RE | Admit: 2020-05-16 | Payer: Medicaid Other | Source: Ambulatory Visit

## 2020-07-17 ENCOUNTER — Other Ambulatory Visit: Payer: Self-pay | Admitting: Obstetrics & Gynecology

## 2020-07-17 ENCOUNTER — Ambulatory Visit
Admission: RE | Admit: 2020-07-17 | Discharge: 2020-07-17 | Disposition: A | Discharge status: Home / Self Care | Payer: Medicaid Other | Source: Ambulatory Visit | Attending: Obstetrics & Gynecology | Admitting: Obstetrics & Gynecology

## 2020-07-17 ENCOUNTER — Other Ambulatory Visit: Payer: Self-pay

## 2020-07-17 DIAGNOSIS — R921 Mammographic calcification found on diagnostic imaging of breast: Secondary | ICD-10-CM | POA: Diagnosis not present

## 2020-07-17 DIAGNOSIS — R922 Inconclusive mammogram: Secondary | ICD-10-CM | POA: Diagnosis not present

## 2020-07-19 ENCOUNTER — Other Ambulatory Visit (HOSPITAL_COMMUNITY)
Admission: RE | Admit: 2020-07-19 | Discharge: 2020-07-19 | Disposition: A | Discharge status: Home / Self Care | Payer: Medicaid Other | Source: Ambulatory Visit | Attending: Obstetrics & Gynecology | Admitting: Obstetrics & Gynecology

## 2020-07-19 ENCOUNTER — Other Ambulatory Visit: Payer: Self-pay

## 2020-07-19 ENCOUNTER — Encounter: Payer: Self-pay | Admitting: Obstetrics & Gynecology

## 2020-07-19 ENCOUNTER — Ambulatory Visit: Payer: Medicaid Other | Admitting: Obstetrics & Gynecology

## 2020-07-19 VITALS — BP 118/78 | HR 87 | Wt 152.0 lb

## 2020-07-19 DIAGNOSIS — Z1231 Encounter for screening mammogram for malignant neoplasm of breast: Secondary | ICD-10-CM | POA: Diagnosis not present

## 2020-07-19 DIAGNOSIS — N898 Other specified noninflammatory disorders of vagina: Secondary | ICD-10-CM | POA: Insufficient documentation

## 2020-07-19 DIAGNOSIS — Z01419 Encounter for gynecological examination (general) (routine) without abnormal findings: Secondary | ICD-10-CM

## 2020-07-19 NOTE — Patient Instructions (Signed)
Lubricants  - Water or silicone-based  - No perfumes; avoid glycerin or parabens  - If water based look for information on osmolality  - Lubricate all surfaces as a part of foreplay  - Keep lubricant handy in case more is needed  - Sneaking into bathroom before sex is not a good way to use lubricants

## 2020-07-19 NOTE — Progress Notes (Signed)
Subjective:     Christina Valenzuela is a 45 y.o. female here for a routine exam. Pt is s/p hyst 2018 with Dr. [place] Pink.  Current complaints: pt reports itching and discharge of the vagina and vulva. She reports that she uses wipes with no soap now and still has sx. Her husband has changed his soap as well. She reports dryness intermittently following intercourse.     Gynecologic History Patient's last menstrual period was 08/31/2016. Contraception: status post hysterectomy Last Pap: 2018. Results were: normal Last mammogram: 07/17/2020. Results were: abnormal birad 3  Obstetric History OB History  Gravida Para Term Preterm AB Living  8 4 3 1 4 5   SAB IAB Ectopic Multiple Live Births  2 2 0 1 5    # Outcome Date GA Lbr Len/2nd Weight Sex Delivery Anes PTL Lv  8 Term 11/14/06 [redacted]w[redacted]d   F CS-LTranv EPI  LIV  7 Term 02/02/04 [redacted]w[redacted]d  6 lb (2.722 kg) M VBAC EPI  LIV  6 Term 04/27/92 [redacted]w[redacted]d   M Vag-Spont EPI  LIV  5 IAB         DEC  4 IAB         DEC  3 SAB         DEC  2 SAB         DEC  1A Preterm 08/04/96 [redacted]w[redacted]d  1 lb 4 oz (0.567 kg) F CS-LTranv EPI  LIV  1B Preterm    1 lb 5 oz (0.595 kg) F CS-LTranv EPI  LIV   The following portions of the patient's history were reviewed and updated as appropriate: allergies, current medications, past family history, past medical history, past social history, past surgical history and problem list.  Review of Systems Pertinent items are noted in HPI.    Objective:  BP 118/78   Pulse 87   Wt 152 lb (68.9 kg)   LMP 08/31/2016   BMI 27.80 kg/m  General Appearance:    Alert, cooperative, no distress, appears stated age  Head:    Normocephalic, without obvious abnormality, atraumatic  Eyes:    conjunctiva/corneas clear, EOM's intact, both eyes  Ears:    Normal external ear canals, both ears  Nose:   Nares normal, septum midline, mucosa normal, no drainage    or sinus tenderness  Throat:   Lips, mucosa, and tongue normal; teeth and gums normal   Neck:   Supple, symmetrical, trachea midline, no adenopathy;    thyroid:  no enlargement/tenderness/nodules  Back:     Symmetric, no curvature, ROM normal, no CVA tenderness  Lungs:     respirations unlabored  Chest Wall:    No tenderness or deformity   Heart:    Regular rate and rhythm  Breast Exam:    No tenderness, masses, or nipple abnormality  Abdomen:     Soft, non-tender, bowel sounds active all four quadrants,    no masses, no organomegaly  Genitalia:    Normal female without lesion, discharge or tenderness   Thick white discharge noted. No lesions on external vulva.   Extremities:   Extremities normal, atraumatic, no cyanosis or edema  Pulses:   2+ and symmetric all extremities  Skin:   Skin color, texture, turgor normal, no rashes or lesions    Assessment:    Healthy female exam.   Vaginal/vulvar itching- suspect yeast Vaginal dryness with intercourse- reviewed options for management. Handout given   Breast cancer screen   Plan:   Sharlyne was  seen today for gynecologic exam.  Diagnoses and all orders for this visit:  Well female exam with routine gynecological exam  Vaginal discharge -     Cervicovaginal ancillary only( [place])  Vaginal itching -     Cervicovaginal ancillary only( )  Encounter for screening mammogram for malignant neoplasm of breast -     MM DIGITAL SCREENING BILATERAL; Future  f/u in 1 year or sooner prn   Nephtali Docken L. Harraway-Smith, M.D., Cherlynn June

## 2020-07-23 ENCOUNTER — Other Ambulatory Visit: Payer: Self-pay | Admitting: Obstetrics & Gynecology

## 2020-07-23 ENCOUNTER — Other Ambulatory Visit: Payer: Self-pay

## 2020-07-23 LAB — CERVICOVAGINAL ANCILLARY ONLY
Bacterial Vaginitis (gardnerella): POSITIVE — AB
Candida Glabrata: NEGATIVE
Candida Vaginitis: NEGATIVE
Comment: NEGATIVE
Comment: NEGATIVE
Comment: NEGATIVE

## 2020-07-23 MED ORDER — METRONIDAZOLE 500 MG PO TABS: 500.0000 mg | ORAL_TABLET | Freq: Two times a day (BID) | ORAL | 0 refills | Status: DC

## 2020-07-23 MED FILL — METRONIDAZOLE 500 MG TABS: 500 | 7 days supply | Qty: 14 | Fill #0

## 2020-07-26 ENCOUNTER — Other Ambulatory Visit: Payer: Self-pay | Admitting: Obstetrics & Gynecology

## 2020-07-30 IMAGING — CR DG CHEST 2V
2 series · 2 of 2 positions shown · non-contrast
Comparison: Radiographs March 10, 2012.

CLINICAL DATA: Chest pain.

EXAM:
CHEST - 2 VIEW

[w chest pa]
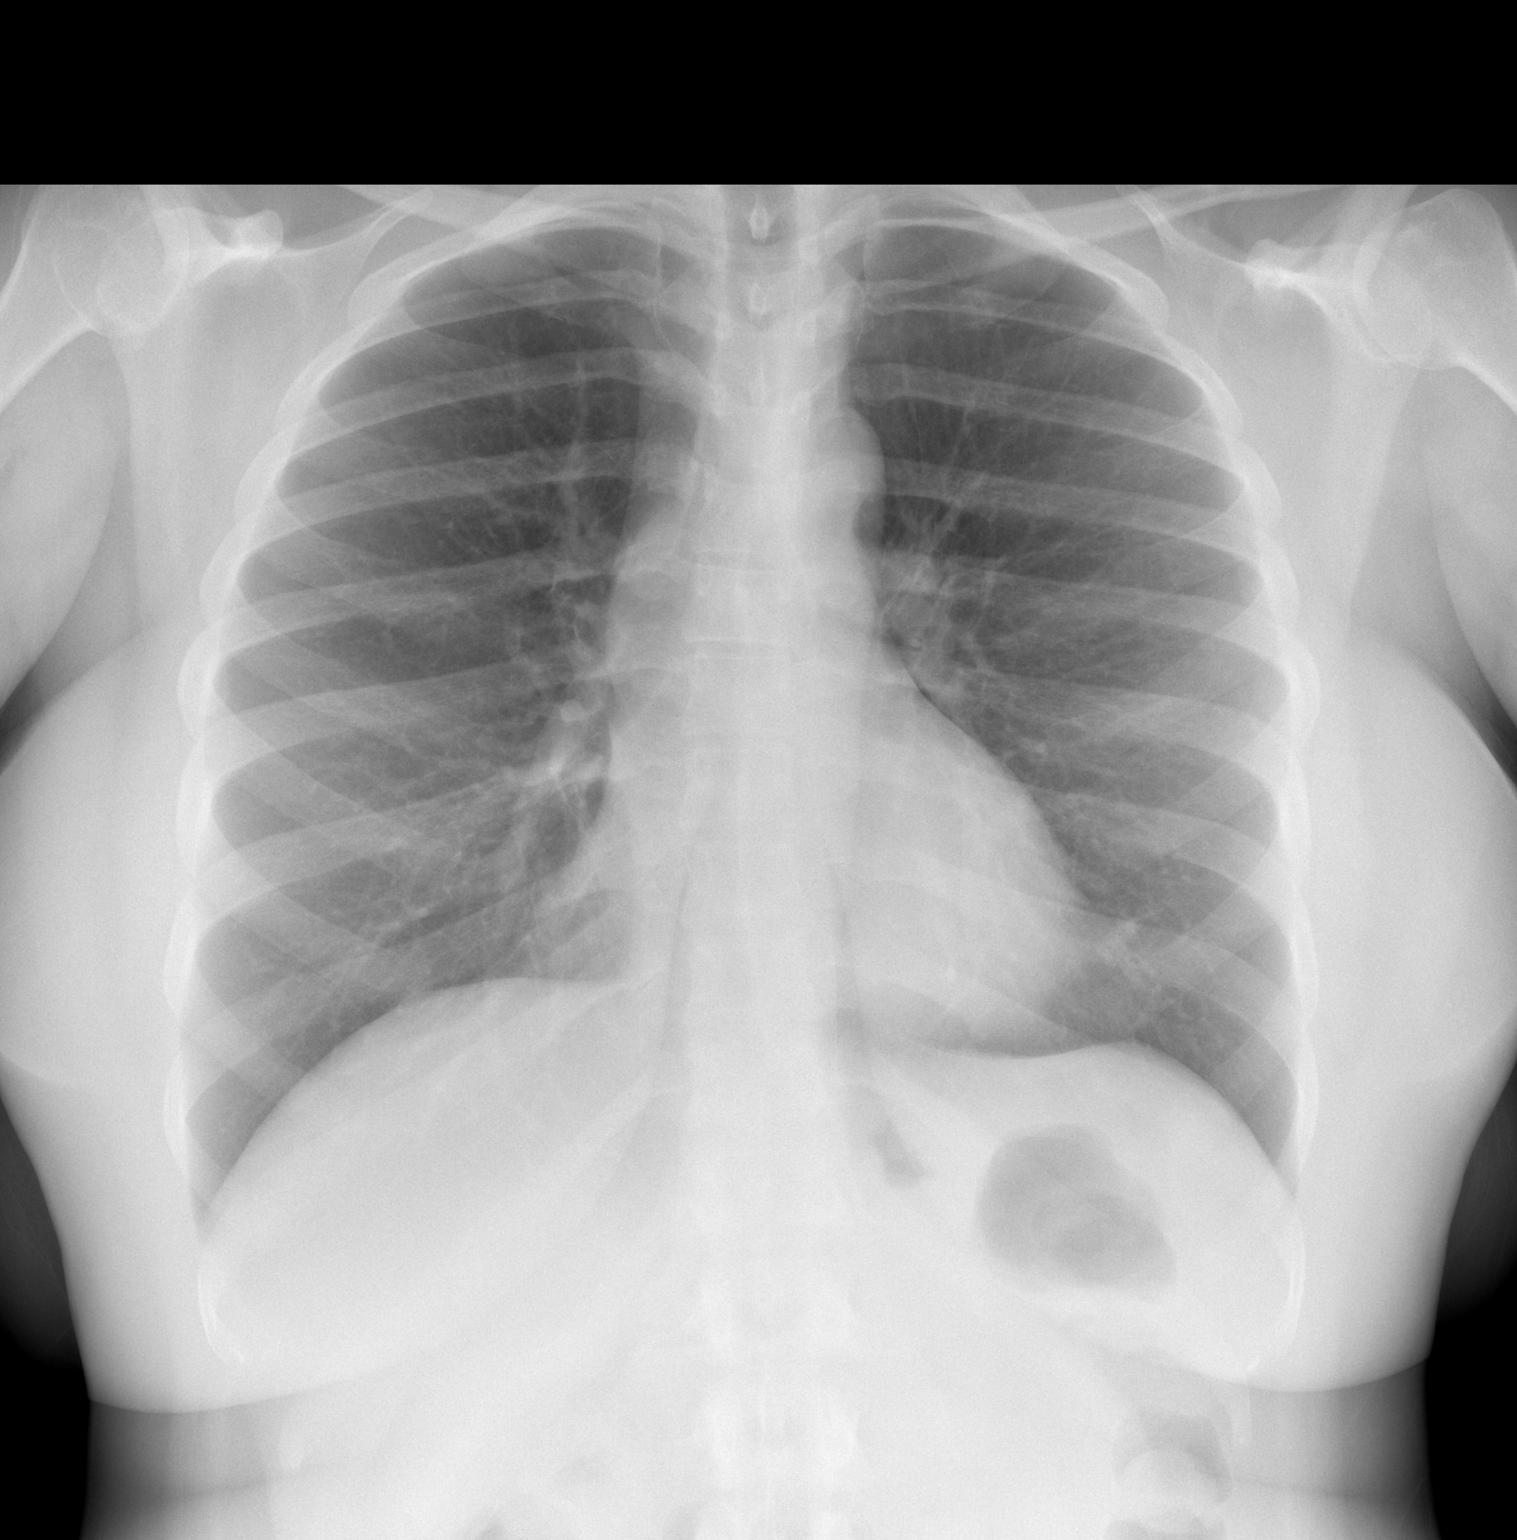

[w chest lat]
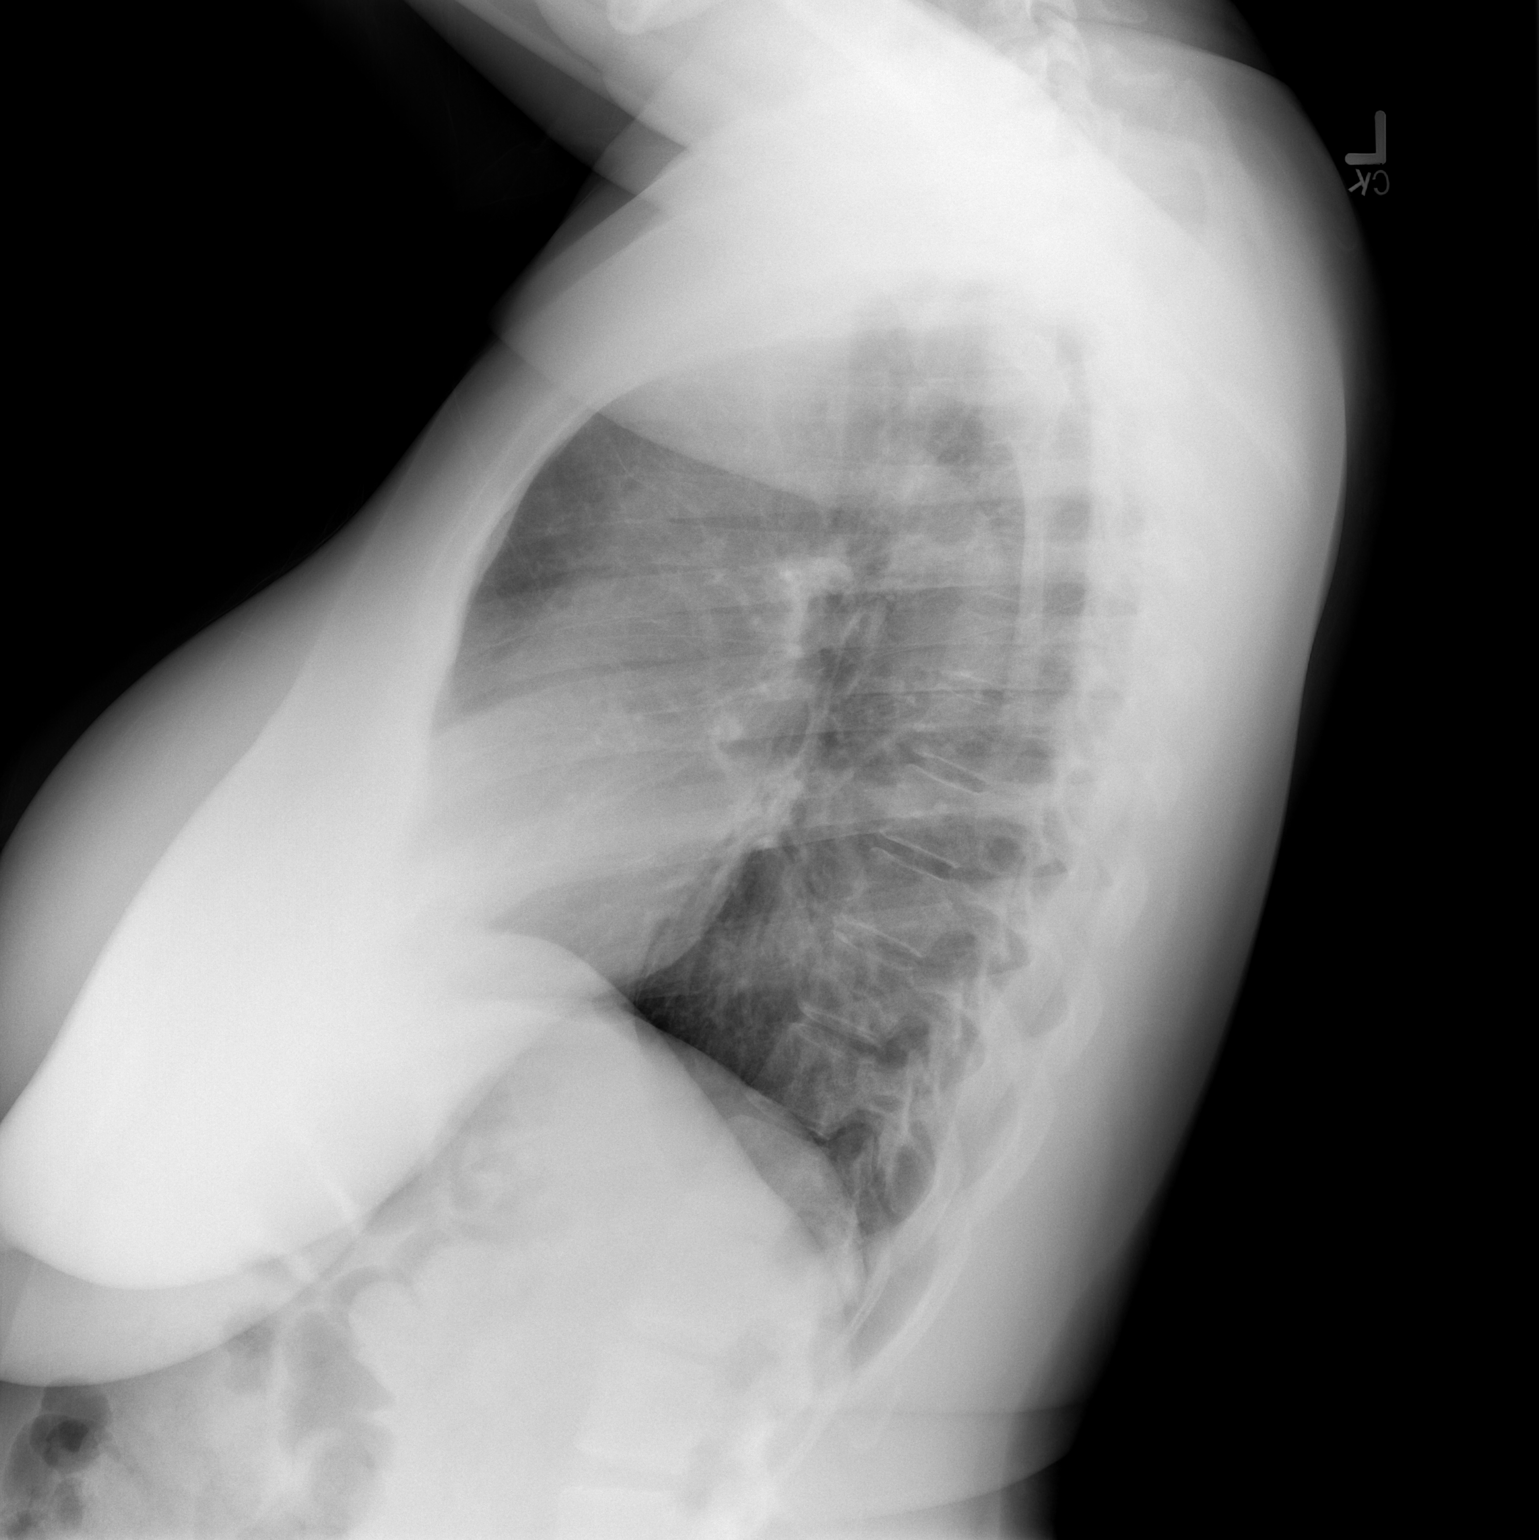

[2 of 2 positions shown; findings below may reference images not displayed]

FINDINGS: The heart size and mediastinal contours are within normal limits.
Both lungs are clear. No pneumothorax or pleural effusion is noted.
The visualized skeletal structures are unremarkable.
IMPRESSION: No active cardiopulmonary disease.

## 2020-08-13 ENCOUNTER — Other Ambulatory Visit (HOSPITAL_BASED_OUTPATIENT_CLINIC_OR_DEPARTMENT_OTHER): Payer: Self-pay

## 2020-08-13 ENCOUNTER — Other Ambulatory Visit: Payer: Self-pay

## 2020-08-13 DIAGNOSIS — B379 Candidiasis, unspecified: Secondary | ICD-10-CM

## 2020-08-13 MED ORDER — FLUCONAZOLE 150 MG PO TABS
ORAL_TABLET | ORAL | 1 refills | Status: DC
  Filled 2020-08-13: qty 1, 1d supply, fill #0

## 2020-08-20 ENCOUNTER — Other Ambulatory Visit (HOSPITAL_BASED_OUTPATIENT_CLINIC_OR_DEPARTMENT_OTHER): Payer: Self-pay

## 2020-08-30 ENCOUNTER — Encounter: Payer: Self-pay | Admitting: Family

## 2020-08-30 ENCOUNTER — Other Ambulatory Visit: Payer: Self-pay

## 2020-08-30 ENCOUNTER — Other Ambulatory Visit (HOSPITAL_BASED_OUTPATIENT_CLINIC_OR_DEPARTMENT_OTHER): Payer: Self-pay

## 2020-08-30 ENCOUNTER — Ambulatory Visit: Payer: 59 | Admitting: Family

## 2020-08-30 VITALS — BP 130/96 | HR 54 | Temp 98.5°F | Ht 62.0 in | Wt 159.6 lb

## 2020-08-30 DIAGNOSIS — R0681 Apnea, not elsewhere classified: Secondary | ICD-10-CM

## 2020-08-30 DIAGNOSIS — R0789 Other chest pain: Secondary | ICD-10-CM

## 2020-08-30 DIAGNOSIS — R5383 Other fatigue: Secondary | ICD-10-CM

## 2020-08-30 DIAGNOSIS — F418 Other specified anxiety disorders: Secondary | ICD-10-CM

## 2020-08-30 DIAGNOSIS — Z13220 Encounter for screening for lipoid disorders: Secondary | ICD-10-CM | POA: Diagnosis not present

## 2020-08-30 DIAGNOSIS — R0683 Snoring: Secondary | ICD-10-CM | POA: Diagnosis not present

## 2020-08-30 MED ORDER — VENLAFAXINE HCL ER 37.5 MG PO CP24
ORAL_CAPSULE | ORAL | 1 refills | Status: DC
  Filled 2020-08-30: qty 60, 34d supply, fill #0

## 2020-08-30 NOTE — Progress Notes (Signed)
Christina Valenzuela is a 45 y.o. female with the following history as recorded in [employer]:  Patient Active Problem List   Diagnosis Date Noted  . Enlarged lymph node 01/17/2020  . Capsulitis of right shoulder 08/24/2019  . Trigger ring finger of right hand 08/24/2019  . Effusion of both knee joints 07/13/2019  . Cervical radiculopathy 07/13/2019  . Pelvic pain 07/29/2018  . Acute on chronic cholecystitis s/p lap cholecystectomy 01/05/2017 01/05/2017  . MRSA (methicillin resistant Staphylococcus aureus) colonization 01/05/2017  . Mixed incontinence 10/24/2016  . Chronic anemia 09/08/2015  . Pica 09/08/2015  . Primary insomnia 08/22/2015  . Depression with anxiety 08/22/2015  . Alcohol abuse 08/22/2015  . Headache 08/22/2015  . UTI symptoms 02/16/2014  . 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    Current Outpatient Medications  Medication Sig Dispense Refill  . diphenhydrAMINE (BENADRYL) 25 MG tablet Take 50 mg by mouth at bedtime as needed.    . venlafaxine XR (EFFEXOR XR) 37.5 MG 24 hr capsule Take 1 tablet by mouth daily for 7 days then increase to 2 tablets daily as directed 60 capsule 1   No current facility-administered medications for this visit.    Allergies: Hydrocodone-acetaminophen and Latex  Past Medical History:  Diagnosis Date  . Depression     Past Surgical History:  Procedure Laterality Date  . ABDOMINAL HYSTERECTOMY  11/28/2016   [facility] - Dr Zigmund Daniel.  UTERINE LEIOMYOMA w/ MENORRHAGIA  . CESAREAN SECTION    . CESAREAN SECTION    . CHOLECYSTECTOMY N/A 01/05/2017   Procedure: LAPAROSCOPIC CHOLECYSTECTOMY WITH INTRAOPERATIVE CHOLANGIOGRAM;  Surgeon: Michael Boston, MD;  Location: [facility];  Service: General;  Laterality: N/A;  . EPIGASTRIC HERNIA REPAIR     with mesh.  Dr Excell Seltzer  . TUBAL LIGATION      Family History  Problem Relation Age of Onset  . Diabetes Maternal Grandfather     Social History   Tobacco Use  . Smoking status: Current Every Day Smoker     Packs/day: 0.25    Types: Cigarettes  . Smokeless tobacco: Never Used  Substance Use Topics  . Alcohol use: No    Alcohol/week: 0.0 standard drinks    Subjective:  Patient presents today as a new patient to establish with PCP; Does have GYN;  Having increased problems with stress- has had problems with stress/ anxiety/ insomnia for years; notes she has started a new job recently and feels this is major contributor; teenage children;  Concerned for sleep apnea- snoring/ does not sleep well;  Also notes she has had "chest pressure" in the past- no chest pain on exertion; + smoker- has quit in the past/ knows she needs to quit;    Objective:  Vitals:   08/30/20 1255  BP: (!) 130/96  Pulse: (!) 54  Temp: 98.5 F (36.9 C)  TempSrc: Oral  SpO2: 95%  Weight: 159 lb 9.6 oz (72.4 kg)  Height: 5' 2"  (1.575 m)    General: Well developed, well nourished, in no acute distress  Skin : Warm and dry.  Head: Normocephalic and atraumatic  Eyes: Sclera and conjunctiva clear; pupils round and reactive to light; extraocular movements intact  Ears: External normal; canals clear; tympanic membranes normal  Oropharynx: Pink, supple. No suspicious lesions  Neck: Supple without thyromegaly, adenopathy  Lungs: Respirations unlabored; clear to auscultation bilaterally without wheeze, rales, rhonchi  CVS exam: normal rate and regular rhythm.  Neurologic: Alert and oriented; speech intact; face symmetrical; moves all extremities well; CNII-XII intact without focal  deficit   Assessment:  1. Snoring   2. Witnessed episode of apnea   3. Other fatigue   4. Lipid screening   5. Depression with anxiety   6. Atypical chest pain     Plan:  1. Refer for sleep study; 3. Patient wants to come back for labs at later date; check CBC, CMP TSH; 4. Check lipid panel; 5. Refer to behavioral health; trial of Effexor XR- risks and benefits discussed; follow-up in 1 month; 6. She defers EKG today; refer to  cardiology for further evaluation; also encouraged to check her blood pressure regularly- will re-check in 1 month;  This visit occurred during the SARS-CoV-2 public health emergency.  Safety protocols were in place, including screening questions prior to the visit, additional usage of staff PPE, and extensive cleaning of exam room while observing appropriate contact time as indicated for disinfecting solutions.     No follow-ups on file.  Orders Placed This Encounter  Procedures  . CBC with Differential/Platelet  . Comp Met (CMET)  . Lipid panel  . TSH  . Ambulatory referral to Neurology    Referral Priority:   Routine    Referral Type:   Consultation    Referral Reason:   Specialty Services Required    Requested Specialty:   Neurology    Number of Visits Requested:   1  . Ambulatory referral to Psychology    Referral Priority:   Routine    Referral Type:   Psychiatric    Referral Reason:   Specialty Services Required    Requested Specialty:   Psychology    Number of Visits Requested:   1  . Ambulatory referral to Cardiology    Referral Priority:   Routine    Referral Type:   Consultation    Referral Reason:   Specialty Services Required    Requested Specialty:   Cardiology    Number of Visits Requested:   1    Requested Prescriptions   Signed Prescriptions Disp Refills  . venlafaxine XR (EFFEXOR XR) 37.5 MG 24 hr capsule 60 capsule 1    Sig: Take 1 tablet by mouth daily for 7 days then increase to 2 tablets daily as directed

## 2020-08-30 NOTE — Patient Instructions (Signed)
Please start checking your blood pressure regularly and write the numbers down- bring to your next appointment;  If you purchase a home blood pressure cuff, purchase an arm cuff; [employer] brand is the recommended brand;

## 2020-09-07 ENCOUNTER — Other Ambulatory Visit (INDEPENDENT_AMBULATORY_CARE_PROVIDER_SITE_OTHER): Payer: 59

## 2020-09-07 ENCOUNTER — Other Ambulatory Visit: Payer: Self-pay

## 2020-09-07 DIAGNOSIS — Z13220 Encounter for screening for lipoid disorders: Secondary | ICD-10-CM | POA: Diagnosis not present

## 2020-09-07 DIAGNOSIS — R5383 Other fatigue: Secondary | ICD-10-CM | POA: Diagnosis not present

## 2020-09-07 LAB — COMPREHENSIVE METABOLIC PANEL
ALT: 18 U/L (ref 0–35)
AST: 15 U/L (ref 0–37)
Albumin: 4.3 g/dL (ref 3.5–5.2)
Alkaline Phosphatase: 58 U/L (ref 39–117)
BUN: 9 mg/dL (ref 6–23)
CO2: 26 mEq/L (ref 19–32)
Calcium: 9.8 mg/dL (ref 8.4–10.5)
Chloride: 103 mEq/L (ref 96–112)
Creatinine, Ser: 0.77 mg/dL (ref 0.40–1.20)
GFR: 93.2 mL/min (ref >60.00)
Glucose, Bld: 82 mg/dL (ref 70–99)
Potassium: 4.2 mEq/L (ref 3.5–5.1)
Sodium: 137 mEq/L (ref 135–145)
Total Bilirubin: 0.3 mg/dL (ref 0.2–1.2)
Total Protein: 7.3 g/dL (ref 6.0–8.3)

## 2020-09-07 LAB — LIPID PANEL
Cholesterol: 184 mg/dL (ref 0–200)
HDL: 54.1 mg/dL (ref >39.00)
LDL Cholesterol: 90 mg/dL (ref 0–99)
NonHDL: 129.83
Total CHOL/HDL Ratio: 3
Triglycerides: 200 mg/dL — ABNORMAL HIGH (ref 0.0–149.0)
VLDL: 40 mg/dL (ref 0.0–40.0)

## 2020-09-07 LAB — CBC WITH DIFFERENTIAL/PLATELET
Basophils Absolute: 0.1 10*3/uL (ref 0.0–0.1)
Basophils Relative: 1 % (ref 0.0–3.0)
Eosinophils Absolute: 0.3 10*3/uL (ref 0.0–0.7)
Eosinophils Relative: 3.1 % (ref 0.0–5.0)
HCT: 40 % (ref 36.0–46.0)
Hemoglobin: 13.1 g/dL (ref 12.0–15.0)
Lymphocytes Relative: 36.6 % (ref 12.0–46.0)
Lymphs Abs: 3 10*3/uL (ref 0.7–4.0)
MCHC: 32.8 g/dL (ref 30.0–36.0)
MCV: 84.7 fl (ref 78.0–100.0)
Monocytes Absolute: 0.5 10*3/uL (ref 0.1–1.0)
Monocytes Relative: 6.4 % (ref 3.0–12.0)
Neutro Abs: 4.4 10*3/uL (ref 1.4–7.7)
Neutrophils Relative %: 52.9 % (ref 43.0–77.0)
Platelets: 298 10*3/uL (ref 150.0–400.0)
RBC: 4.73 Mil/uL (ref 3.87–5.11)
RDW: 13.3 % (ref 11.5–15.5)
WBC: 8.3 10*3/uL (ref 4.0–10.5)

## 2020-09-07 LAB — TSH: TSH: 0.67 u[IU]/mL (ref 0.35–4.50)

## 2020-10-04 ENCOUNTER — Other Ambulatory Visit (HOSPITAL_BASED_OUTPATIENT_CLINIC_OR_DEPARTMENT_OTHER): Payer: Self-pay

## 2020-10-04 ENCOUNTER — Other Ambulatory Visit: Payer: Self-pay

## 2020-10-04 DIAGNOSIS — B379 Candidiasis, unspecified: Secondary | ICD-10-CM

## 2020-10-04 MED ORDER — FLUCONAZOLE 150 MG PO TABS
150.0000 mg | ORAL_TABLET | Freq: Once | ORAL | 3 refills | Status: AC
  Filled 2020-10-04: qty 1, 3d supply, fill #0

## 2020-10-04 NOTE — Progress Notes (Signed)
Pt request Rx for Diflucan due to yeast infection.

## 2020-10-11 ENCOUNTER — Encounter (HOSPITAL_BASED_OUTPATIENT_CLINIC_OR_DEPARTMENT_OTHER): Payer: Self-pay | Admitting: *Deleted

## 2020-10-11 ENCOUNTER — Other Ambulatory Visit: Payer: Self-pay

## 2020-10-11 ENCOUNTER — Emergency Department (HOSPITAL_BASED_OUTPATIENT_CLINIC_OR_DEPARTMENT_OTHER)
Admission: EM | Admit: 2020-10-11 | Discharge: 2020-10-11 | Disposition: A | Discharge status: Home / Self Care | Payer: 59 | Attending: Emergency Medicine | Admitting: Emergency Medicine

## 2020-10-11 DIAGNOSIS — K0889 Other specified disorders of teeth and supporting structures: Secondary | ICD-10-CM

## 2020-10-11 DIAGNOSIS — F17210 Nicotine dependence, cigarettes, uncomplicated: Secondary | ICD-10-CM | POA: Insufficient documentation

## 2020-10-11 DIAGNOSIS — K029 Dental caries, unspecified: Secondary | ICD-10-CM | POA: Diagnosis not present

## 2020-10-11 DIAGNOSIS — K047 Periapical abscess without sinus: Secondary | ICD-10-CM | POA: Diagnosis not present

## 2020-10-11 DIAGNOSIS — Z91040 Latex allergy status: Secondary | ICD-10-CM | POA: Insufficient documentation

## 2020-10-11 MED ORDER — LIDOCAINE VISCOUS HCL 2 % MT SOLN: 15.0000 mL | OROMUCOSAL | 0 refills | Status: DC | PRN

## 2020-10-11 MED ORDER — AMOXICILLIN-POT CLAVULANATE 875-125 MG PO TABS: 1.0000 | ORAL_TABLET | Freq: Two times a day (BID) | ORAL | 0 refills | Status: DC

## 2020-10-11 NOTE — ED Provider Notes (Signed)
[facility] Provider Note   CSN: [id number] Arrival date & time: 10/11/20  1719     History Chief Complaint  Patient presents with   Dental Pain    Christina Valenzuela is a 45 y.o. female.  Patient presents emergency department for evaluation of left upper dental pain starting last night.  She has a history of the same.  No facial swelling.  No difficulty breathing or swallowing.  No fevers.  She has taken ibuprofen without improvement.  She has received relief in the past after being prescribed viscous lidocaine.  She saw Dr. Radford Pax who pulled a tooth previously.  She would like to go back to him now that she has dental insurance.      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  UTERINE LEIOMYOMA w/ MENORRHAGIA   CESAREAN SECTION     CESAREAN SECTION     CHOLECYSTECTOMY N/A 01/05/2017   Procedure: LAPAROSCOPIC CHOLECYSTECTOMY WITH INTRAOPERATIVE CHOLANGIOGRAM;  Surgeon: Michael Boston, MD;  Location: [facility];  Service: General;  Laterality: N/A;   EPIGASTRIC HERNIA REPAIR     with mesh.  Dr Excell Seltzer   TUBAL LIGATION       OB History      Gravida  8   Para  4   Term  3   Preterm  1   AB  4   Living  5      SAB  2   IAB  2   Ectopic  0   Multiple  1   Live Births  5           Family History  Problem Relation Age of Onset   Diabetes Maternal Grandfather     Social History   Tobacco Use   Smoking status: Every Day    Packs/day: 0.25    Pack years: 0.00    Types: Cigarettes   Smokeless tobacco: Never  Vaping Use   Vaping Use: Never used  Substance Use Topics   Alcohol use: No    Alcohol/week: 0.0 standard drinks   Drug use: No    Home Medications Prior to Admission medications   Medication Sig Start Date End Date Taking? Authorizing Provider  amoxicillin-clavulanate (AUGMENTIN) 875-125 MG tablet Take 1 tablet by mouth every 12 (twelve) hours. 10/11/20  Yes Carlisle Cater, PA-C  lidocaine (XYLOCAINE) 2 % solution Use as directed 15 mLs in the mouth or throat as needed for mouth pain. 10/11/20  Yes Carlisle Cater, PA-C  venlafaxine XR (EFFEXOR XR) 37.5 MG 24 hr capsule Take 1 tablet by mouth daily for 7 days then increase to 2 tablets daily as directed 08/30/20  Yes Valere Dross,  Marvis Repress, FNP  diphenhydrAMINE (BENADRYL) 25 MG tablet Take 50 mg by mouth at bedtime as needed.    [provider]    Allergies    Hydrocodone-acetaminophen and Latex  Review of Systems   Review of Systems  Constitutional:  Negative for fever.  HENT:  Positive for dental problem. Negative for ear pain, facial swelling, sore throat and trouble swallowing.   Respiratory:  Negative for shortness of breath and stridor.   Musculoskeletal:  Negative for neck pain.  Skin:  Negative for color change.  Neurological:  Negative for headaches.   Physical Exam Updated Vital Signs BP (!) 130/99 (BP Location: Right Arm)   Pulse 85   Temp 98.4 F (36.9 C) (Oral)   Resp 16   Ht 5\' 2"  (1.575 m)   Wt 72.4 kg   LMP 08/31/2016   SpO2 97%   BMI 29.19 kg/m   Physical Exam Vitals and nursing note reviewed.   Constitutional:      Appearance: She is well-developed.  HENT:     Head: Normocephalic and atraumatic.     Jaw: No trismus.     Right Ear: Tympanic membrane, ear canal and external ear normal.     Left Ear: Tympanic membrane, ear canal and external ear normal.     Nose: Nose normal.     Mouth/Throat:     Dentition: Abnormal dentition. Dental caries present. No dental abscesses.     Pharynx: Uvula midline. No uvula swelling.     Tonsils: No tonsillar abscesses.     Comments: Patient with multiple missing teeth.  She has a single molar in the left maxilla which seems to be the tooth that is causing pain.  No gross or palpable abscess.  Eyes:     Conjunctiva/sclera: Conjunctivae normal.  Neck:     Comments: No neck swelling or Ludwig's angina Musculoskeletal:     Cervical back: Normal range of motion and neck supple.  Lymphadenopathy:     Cervical: No cervical adenopathy.  Skin:    General: Skin is warm and dry.  Neurological:     Mental Status: She is alert.    ED Results / Procedures / Treatments   Labs (all labs ordered are listed, but only abnormal results are displayed) Labs Reviewed - No data to display  EKG None  Radiology No results found.  Procedures Procedures   Medications Ordered in ED Medications - No data to display  ED Course  I have reviewed the triage vital signs and the nursing notes.  Pertinent labs & imaging results that were available during my care of the patient were reviewed by me and considered in my medical decision making (see chart for details).  6:58 PM Patient seen and examined.   Vital signs reviewed and are as follows: BP (!) 130/99 (BP Location: Right Arm)   Pulse 85   Temp 98.4 F (36.9 C) (Oral)   Resp 16   Ht 5\' 2"  (1.575 m)   Wt 72.4 kg   LMP 08/31/2016   SpO2 97%   BMI 29.19 kg/m   Plan: Viscous lidocaine, Augmentin.  Patient counseled to take prescribed medications as directed, return with worsening facial or neck  swelling, and to follow-up with their dentist as soon as possible.      MDM Rules/Calculators/A&P                          Patient presents for dental pain. They  do not have a fever and do not appear septic. Exam unconcerning for Ludwig's angina or other deep tissue infection in neck and I do not feel that advanced imaging is indicated at this time. Low suspicion for PTA, RPA, epiglottis based on exam.   Patient will be treated for dental infection with antibiotic. Encouraged tylenol/NSAIDs as prescribed or as directed on the packaging for pain. Encouraged follow-up with a dentist for definitive and long-term management.   Final Clinical Impression(s) / ED Diagnoses Final diagnoses:  Pain, dental    Rx / DC Orders ED Discharge Orders          Ordered    lidocaine (XYLOCAINE) 2 % solution  As needed        10/11/20 1854    amoxicillin-clavulanate (AUGMENTIN) 875-125 MG tablet  Every 12 hours        10/11/20 1854             Carlisle Cater, PA-C 10/11/20 1858    Arnaldo Natal, MD 10/11/20 [id number]

## 2020-10-11 NOTE — ED Triage Notes (Signed)
Dental pain since last night. No facial swelling.

## 2020-10-11 NOTE — Discharge Instructions (Signed)
Please read and follow all provided instructions.  Your diagnoses today include:  1. Pain, dental     The exam and treatment you received today has been provided on an emergency basis only. This is not a substitute for complete medical or dental care.  Tests performed today include: Vital signs. See below for your results today.   Medications prescribed:  Augmentin - antibiotic  You have been prescribed an antibiotic medicine: take the entire course of medicine even if you are feeling better. Stopping early can cause the antibiotic not to work.  Take any prescribed medications only as directed.  Home care instructions:  Follow any educational materials contained in this packet.  Follow-up instructions: Please follow-up with your dentist for further evaluation of your symptoms.   Dental Assistance: See attached dental referral and/or resource guide.   Return instructions:  Please return to the Emergency Department if you experience worsening symptoms. Please return if you develop a fever, you develop more swelling in your face or neck, you have trouble breathing or swallowing food. Please return if you have any other emergent concerns.  Additional Information:  Your vital signs today were: BP (!) 130/99 (BP Location: Right Arm)   Pulse 85   Temp 98.4 F (36.9 C) (Oral)   Resp 16   Ht 5\' 2"  (1.575 m)   Wt 72.4 kg   LMP 08/31/2016   SpO2 97%   BMI 29.19 kg/m  If your blood pressure (BP) was elevated above 135/85 this visit, please have this repeated by your doctor within one month. --------------

## 2020-10-12 ENCOUNTER — Telehealth: Payer: Self-pay | Admitting: *Deleted

## 2020-10-12 NOTE — Telephone Encounter (Signed)
Transition Care Management Unsuccessful Follow-up Telephone Call  Date of discharge and from where:  10/11/2020 - [facility]  Attempts:  1st Attempt  Reason for unsuccessful TCM follow-up call:  Voice mail full

## 2020-10-15 NOTE — Telephone Encounter (Signed)
Transition Care Management Unsuccessful Follow-up Telephone Call  Date of discharge and from where:  10/11/2020 - [facility]  Attempts:  2nd Attempt  Reason for unsuccessful TCM follow-up call:  Voice mail full

## 2020-10-16 NOTE — Telephone Encounter (Signed)
Transition Care Management Unsuccessful Follow-up Telephone Call  Date of discharge and from where:  10/11/2020 - [facility]  Attempts:  3rd Attempt  Reason for unsuccessful TCM follow-up call:  Voice mail full

## 2020-10-20 ENCOUNTER — Other Ambulatory Visit: Payer: Self-pay

## 2020-10-20 ENCOUNTER — Emergency Department (HOSPITAL_BASED_OUTPATIENT_CLINIC_OR_DEPARTMENT_OTHER): Payer: 59

## 2020-10-20 ENCOUNTER — Emergency Department (HOSPITAL_BASED_OUTPATIENT_CLINIC_OR_DEPARTMENT_OTHER)
Admission: EM | Admit: 2020-10-20 | Discharge: 2020-10-20 | Disposition: A | Discharge status: Home / Self Care | Payer: 59 | Attending: Emergency Medicine | Admitting: Emergency Medicine

## 2020-10-20 ENCOUNTER — Encounter (HOSPITAL_BASED_OUTPATIENT_CLINIC_OR_DEPARTMENT_OTHER): Payer: Self-pay | Admitting: Emergency Medicine

## 2020-10-20 DIAGNOSIS — R079 Chest pain, unspecified: Secondary | ICD-10-CM | POA: Diagnosis not present

## 2020-10-20 DIAGNOSIS — F17210 Nicotine dependence, cigarettes, uncomplicated: Secondary | ICD-10-CM | POA: Insufficient documentation

## 2020-10-20 DIAGNOSIS — M25511 Pain in right shoulder: Secondary | ICD-10-CM | POA: Insufficient documentation

## 2020-10-20 DIAGNOSIS — Z91040 Latex allergy status: Secondary | ICD-10-CM | POA: Insufficient documentation

## 2020-10-20 DIAGNOSIS — R0789 Other chest pain: Secondary | ICD-10-CM | POA: Diagnosis not present

## 2020-10-20 MED ORDER — ACETAMINOPHEN 325 MG PO TABS
650.0000 mg | ORAL_TABLET | Freq: Once | ORAL | Status: AC
  Administered 2020-10-20: 650 mg via ORAL
  Filled 2020-10-20: qty 2

## 2020-10-20 NOTE — Discharge Instructions (Addendum)
X-ray shows no acute fracture.  Overall suspect some soft tissue inflammation or sprain or strain.  Recommend 800 mg ibuprofen every 8 hours as needed for pain.  Recommend 1000 mg of Tylenol every 6 hours as needed for pain.  Suspect that pain will improve on its own otherwise.

## 2020-10-20 NOTE — ED Notes (Signed)
Patient transported to X-ray 

## 2020-10-20 NOTE — ED Triage Notes (Signed)
States has been having pain to right chest and right shoulder after having sexual relations last night around 10p" Pain worse with movement

## 2020-10-20 NOTE — ED Provider Notes (Signed)
[facility] Provider Note   CSN: [id number] Arrival date & time: 10/20/20  1002     History Chief Complaint  Patient presents with   Chest Pain    Christina Valenzuela is a 45 y.o. female.  Tenderness over the right-sided chest wall, right shoulder.  Started last night.  Thinks that she might hurt her self.  The history is provided by the patient.  Chest Pain Chest pain location: right chest, right shoulder. Pain severity:  Mild Onset quality:  Gradual Duration:  12 hours Timing:  Intermittent Progression:  Waxing and waning Chronicity:  New Context: movement   Relieved by:  Rest Worsened by:  Movement Associated symptoms: no abdominal pain, no altered mental status, no anorexia, no back pain, no claudication, no cough, no fever, no numbness, no shortness of breath and no weakness       Past Medical History:  Diagnosis Date   Depression     Patient Active Problem List   Diagnosis Date Noted   Enlarged lymph node 01/17/2020   Capsulitis of right shoulder 08/24/2019   Trigger ring finger of right hand 08/24/2019   Effusion of both knee joints 07/13/2019   Cervical radiculopathy 07/13/2019   Pelvic pain 07/29/2018   Acute on chronic cholecystitis s/p lap cholecystectomy 01/05/2017 01/05/2017   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  UTERINE LEIOMYOMA w/ MENORRHAGIA   CESAREAN SECTION     CESAREAN SECTION     CHOLECYSTECTOMY N/A 01/05/2017   Procedure: LAPAROSCOPIC CHOLECYSTECTOMY WITH INTRAOPERATIVE CHOLANGIOGRAM;  Surgeon: Michael Boston, MD;  Location: [facility];  Service: General;  Laterality: N/A;   EPIGASTRIC HERNIA REPAIR     with mesh.  Dr Excell Seltzer   TUBAL LIGATION       OB History     Gravida  8   Para  4   Term  3   Preterm  1   AB  4   Living  5      SAB  2   IAB  2   Ectopic  0   Multiple  1   Live Births  5           Family History  Problem Relation Age of Onset   Diabetes Maternal Grandfather     Social History   Tobacco Use   Smoking status: Every Day    Packs/day: 0.25    Pack years: 0.00    Types: Cigarettes   Smokeless tobacco: Never  Vaping Use   Vaping Use: Never used  Substance Use Topics   Alcohol use: No    Alcohol/week: 0.0 standard drinks   Drug use: No    Home Medications Prior to Admission medications   Medication Sig Start Date End Date Taking? Authorizing Provider  amoxicillin-clavulanate (AUGMENTIN) 875-125 MG tablet Take 1 tablet by mouth every 12 (twelve) hours. 10/11/20  Yes Carlisle Cater, PA-C  venlafaxine XR (EFFEXOR XR) 37.5 MG 24 hr capsule Take 1 tablet by mouth daily for 7 days then increase to 2 tablets daily as directed 08/30/20  Yes Marrian Salvage, FNP  diphenhydrAMINE (BENADRYL) 25 MG tablet Take 50 mg by mouth at bedtime as needed.    [provider]  lidocaine (XYLOCAINE) 2 % solution Use as directed 15 mLs in the mouth or throat as needed for mouth pain. 10/11/20   Carlisle Cater, PA-C    Allergies    Hydrocodone-acetaminophen and Latex  Review of Systems   Review of Systems  Constitutional:  Negative for fever.  Respiratory:  Negative for cough and shortness of breath.   Cardiovascular:  Positive for chest pain. Negative for claudication.  Gastrointestinal:  Negative for abdominal pain and anorexia.  Musculoskeletal:  Positive for arthralgias. Negative for back pain.  Skin:  Negative for color change, rash and wound.  Neurological:  Negative for weakness and numbness.   Physical Exam Updated Vital Signs BP  (!) 144/98 (BP Location: Left Arm)   Pulse 97   Temp 98.2 F (36.8 C) (Oral)   Resp 16   Ht 5\' 2"  (1.575 m)   Wt 71.2 kg   LMP 08/31/2016   SpO2 96%   BMI 28.72 kg/m   Physical Exam Vitals and nursing note reviewed.  Constitutional:      General: She is not in acute distress.    Appearance: She is well-developed. She is not ill-appearing.  HENT:     Head: Normocephalic and atraumatic.     Mouth/Throat:     Mouth: Mucous membranes are moist.  Eyes:     Extraocular Movements: Extraocular movements intact.     Conjunctiva/sclera: Conjunctivae normal.     Pupils: Pupils are equal, round, and reactive to light.  Cardiovascular:     Rate and Rhythm: Normal rate and regular rhythm.     Pulses: Normal pulses.     Heart sounds: Normal heart sounds. No murmur heard. Pulmonary:     Effort: Pulmonary effort is normal. No respiratory distress.     Breath sounds: Normal breath sounds.  Chest:     Chest wall: Mass present.  Abdominal:     Palpations: Abdomen is soft.     Tenderness: There is no abdominal tenderness.  Musculoskeletal:     Cervical back: Neck supple.     Comments: TTP to right shoulder   Skin:    General: Skin is warm and dry.     Capillary Refill: Capillary refill takes less than 2 seconds.  Neurological:     General: No focal deficit present.     Mental Status: She is alert.    ED Results / Procedures / Treatments   Labs (all labs ordered are listed, but only abnormal results are displayed) Labs Reviewed - No data to display  EKG None  Radiology No results found.  Procedures Procedures   Medications Ordered in ED Medications  acetaminophen (TYLENOL) tablet 650 mg (has no administration in time range)    ED Course  I have reviewed the triage vital signs and the nursing notes.  Pertinent labs & imaging results that were available during my care of the patient were reviewed by me and considered in my medical decision making (see chart for  details).    MDM Rules/Calculators/A&P                          Vonzella Nipple is here with right-sided shoulder pain, right-sided chest pain since last night.  Thinks that she might of hurt her self while having sex.  She is tender in the right shoulder, right side of  the chest wall.  X-rays do not show any fracture.  Suspect may be contusions, soft tissue injury.  Recommend ice, rest, Tylenol, Motrin.  Neurovascular neuromuscular intact otherwise.  Discharged in good condition.  EKG shows sinus rhythm.  No concern for cardiac or pulmonary process.  This chart was dictated using voice recognition software.  Despite best efforts to proofread,  errors can occur which can change the documentation meaning.   Final Clinical Impression(s) / ED Diagnoses Final diagnoses:  Chest pain    Rx / DC Orders ED Discharge Orders     None        Lennice Sites, DO 10/20/20 1108

## 2020-10-22 ENCOUNTER — Telehealth: Payer: Self-pay

## 2020-10-22 NOTE — Telephone Encounter (Signed)
Transition Care Management Follow-up Telephone Call Date of discharge and from where: 10/20/2020 from [facility] How have you been since you were released from the hospital? Pt states that she is feeling some better but she is experiencing swelling in her hand and fingers. Pt stated it is gradual. Pt also denies any chest pain, sob, cp or dizziness. Pt feels that is may be her blood pressure. Pt states that she does not check BP at home. I did mention to pt that some insurance will cover the cost and even send an bp cuff to her home. Pt will call her insurance to check on that. Pt is also calling PCP office to schedule an appt with Mickel Baas.  Any questions or concerns? No  Items Reviewed: Did the pt receive and understand the discharge instructions provided? Yes  Medications obtained and verified? No  Other? No  Any new allergies since your discharge? No  Dietary orders reviewed? No Do you have support at home? Yes   Functional Questionnaire: (I = Independent and D = Dependent) ADLs: I  Bathing/Dressing- I  Meal Prep- I  Eating- I  Maintaining continence- I  Transferring/Ambulation- I  Managing Meds- I  Follow up appointments reviewed:  PCP Hospital f/u appt confirmed? Yes  Pt is calling to schedule a follow up with PCP.  [facility] f/u appt confirmed? Yes  Neurology appointment 11/01/2020 Are transportation arrangements needed? No  If their condition worsens, is the pt aware to call PCP or go to the Emergency Dept.? Yes Was the patient provided with contact information for the PCP's office or ED? Yes Was to pt encouraged to call back with questions or concerns? Yes

## 2020-11-01 ENCOUNTER — Telehealth: Payer: Self-pay

## 2020-11-01 ENCOUNTER — Institutional Professional Consult (permissible substitution): Payer: 59 | Admitting: Neurology

## 2020-11-01 ENCOUNTER — Encounter: Payer: Self-pay | Admitting: Neurology

## 2020-11-01 NOTE — Telephone Encounter (Signed)
Pt no showed 11/01/20 appt with Dr. Rexene Alberts

## 2020-12-11 ENCOUNTER — Other Ambulatory Visit (HOSPITAL_BASED_OUTPATIENT_CLINIC_OR_DEPARTMENT_OTHER): Payer: Self-pay

## 2020-12-11 ENCOUNTER — Other Ambulatory Visit: Payer: Self-pay

## 2020-12-11 MED ORDER — FLUCONAZOLE 150 MG PO TABS
150.0000 mg | ORAL_TABLET | Freq: Once | ORAL | 0 refills | Status: AC
  Filled 2020-12-11: qty 1, 1d supply, fill #0

## 2020-12-13 ENCOUNTER — Other Ambulatory Visit (HOSPITAL_COMMUNITY)
Admission: RE | Admit: 2020-12-13 | Discharge: 2020-12-13 | Disposition: A | Discharge status: Home / Self Care | Payer: 59 | Source: Ambulatory Visit | Attending: Family Medicine | Admitting: Family Medicine

## 2020-12-13 ENCOUNTER — Ambulatory Visit (INDEPENDENT_AMBULATORY_CARE_PROVIDER_SITE_OTHER): Payer: 59 | Admitting: Family Medicine

## 2020-12-13 ENCOUNTER — Other Ambulatory Visit: Payer: Self-pay

## 2020-12-13 VITALS — BP 116/82 | HR 99 | Wt 166.0 lb

## 2020-12-13 DIAGNOSIS — N760 Acute vaginitis: Secondary | ICD-10-CM

## 2020-12-13 MED ORDER — BORIC ACID VAGINAL 600 MG VA SUPP: 1.0000 | VAGINAL | 1 refills | Status: DC

## 2020-12-13 NOTE — Progress Notes (Signed)
   Subjective:    Patient ID: Christina Valenzuela, female    DOB: 1975-10-20, 45 y.o.   MRN: [id number]  HPI Patient seen for infections.  Patient gets either recurrent bacterial vaginosis or yeast infections on a monthly basis.  She has unprotected sex with her husband.  Does use mild unscented soaps for washing.  Had recent blood work with primary care physician with a random blood sugar that was normal.   Review of Systems     Objective:   Physical Exam Vitals reviewed. Exam conducted with a chaperone present.  Constitutional:      Appearance: Normal appearance.  Abdominal:     Hernia: There is no hernia in the left inguinal area or right inguinal area.  Genitourinary:    General: Normal vulva.     Labia:        Right: No rash, tenderness or lesion.        Left: No rash, tenderness or lesion.      Comments: Thin white discharge present Lymphadenopathy:     Lower Body: No right inguinal adenopathy. No left inguinal adenopathy.  Neurological:     Mental Status: She is alert.  Psychiatric:        Mood and Affect: Mood normal.        Behavior: Behavior normal.        Thought Content: Thought content normal.          Assessment & Plan:   1. Recurrent vaginitis Discussed probiotic use.  Recommended boric acid, which was prescribed.  If patient has BV, patient would like [facility]. - Cervicovaginal ancillary only( [place])

## 2020-12-14 ENCOUNTER — Other Ambulatory Visit (HOSPITAL_BASED_OUTPATIENT_CLINIC_OR_DEPARTMENT_OTHER): Payer: Self-pay

## 2020-12-14 LAB — CERVICOVAGINAL ANCILLARY ONLY
Bacterial Vaginitis (gardnerella): POSITIVE — AB
Candida Glabrata: NEGATIVE
Candida Vaginitis: NEGATIVE
Comment: NEGATIVE
Comment: NEGATIVE
Comment: NEGATIVE

## 2020-12-14 MED ORDER — METRONIDAZOLE 0.75 % VA GEL
1.0000 | Freq: Every day | VAGINAL | 1 refills | Status: DC
  Filled 2020-12-14: qty 70, 7d supply, fill #0

## 2020-12-14 NOTE — Addendum Note (Signed)
Addended by: Truett Mainland on: 12/14/2020 12:12 PM   Modules accepted: Orders

## 2020-12-29 ENCOUNTER — Other Ambulatory Visit: Payer: Self-pay

## 2020-12-29 ENCOUNTER — Emergency Department (HOSPITAL_BASED_OUTPATIENT_CLINIC_OR_DEPARTMENT_OTHER)
Admission: EM | Admit: 2020-12-29 | Discharge: 2020-12-29 | Disposition: A | Discharge status: Home / Self Care | Payer: 59 | Attending: Emergency Medicine | Admitting: Emergency Medicine

## 2020-12-29 ENCOUNTER — Encounter (HOSPITAL_BASED_OUTPATIENT_CLINIC_OR_DEPARTMENT_OTHER): Payer: Self-pay | Admitting: Emergency Medicine

## 2020-12-29 DIAGNOSIS — Z91040 Latex allergy status: Secondary | ICD-10-CM | POA: Insufficient documentation

## 2020-12-29 DIAGNOSIS — F17210 Nicotine dependence, cigarettes, uncomplicated: Secondary | ICD-10-CM | POA: Diagnosis not present

## 2020-12-29 DIAGNOSIS — R21 Rash and other nonspecific skin eruption: Secondary | ICD-10-CM | POA: Diagnosis present

## 2020-12-29 DIAGNOSIS — L249 Irritant contact dermatitis, unspecified cause: Secondary | ICD-10-CM | POA: Diagnosis not present

## 2020-12-29 MED ORDER — HYDROCORTISONE 1 % EX CREA: TOPICAL_CREAM | CUTANEOUS | 0 refills | Status: DC

## 2020-12-29 NOTE — Discharge Instructions (Addendum)
Is a hydrocortisone cream as needed.  I suspect your rash to be due to new soap products.  If it begins to spread or become painful or increasingly itchy please return to your primary care or urgent care.

## 2020-12-29 NOTE — ED Triage Notes (Signed)
Pt presents with red raised rash to right wrist area x 2 weeks

## 2020-12-29 NOTE — ED Provider Notes (Signed)
[facility] EMERGENCY DEPARTMENT Provider Note   CSN: [id number] Arrival date & time: 12/29/20  1152     History Chief Complaint  Patient presents with   Rash    Christina Valenzuela is a 45 y.o. female presenting with a complaint of a rash to her right wrist.  Says that this popped up 2 weeks ago.  She endorses starting a new soap shortly prior to this rash.  Not painful, somewhat itchy.  It has not spread anywhere and she has no other areas on her body with the rash.  No fevers, chills.  She reports that she noticed the rash popped up after a baseball game.  Has not put anything on it.   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  UTERINE LEIOMYOMA w/ MENORRHAGIA   CESAREAN SECTION     CESAREAN SECTION     CHOLECYSTECTOMY N/A 01/05/2017   Procedure: LAPAROSCOPIC CHOLECYSTECTOMY WITH INTRAOPERATIVE CHOLANGIOGRAM;  Surgeon: Michael Boston, MD;  Location: [facility];  Service: General;  Laterality: N/A;   EPIGASTRIC HERNIA REPAIR     with mesh.  Dr Excell Seltzer   TUBAL LIGATION        OB History     Gravida  8   Para  4   Term  3   Preterm  1   AB  4   Living  5      SAB  2   IAB  2   Ectopic  0   Multiple  1   Live Births  5           Family History  Problem Relation Age of Onset   Diabetes Maternal Grandfather     Social History   Tobacco Use   Smoking status: Every Day    Packs/day: 0.25    Types: Cigarettes   Smokeless tobacco: Never  Vaping Use   Vaping Use: Never used  Substance Use Topics   Alcohol use: Not Currently   Drug use: No    Home Medications Prior to Admission medications   Medication Sig Start Date End Date Taking? Authorizing Provider  amoxicillin-clavulanate (AUGMENTIN) 875-125 MG tablet Take 1 tablet by mouth every 12 (twelve) hours. Patient not taking: Reported on 12/13/2020 10/11/20   Carlisle Cater, PA-C  Boric Acid Vaginal 600 MG SUPP Place 1 suppository vaginally 3 (three) times a week. 12/14/20   Truett Mainland, DO  diphenhydrAMINE (BENADRYL) 25 MG tablet Take 50 mg by mouth at bedtime as needed.    [provider]  lidocaine (XYLOCAINE) 2 % solution  Use as directed 15 mLs in the mouth or throat as needed for mouth pain. Patient not taking: Reported on 12/13/2020 10/11/20   Carlisle Cater, PA-C  metroNIDAZOLE (METROGEL) 0.75 % vaginal gel Place 1 Applicatorful vaginally at bedtime for 5 nights 12/14/20   Truett Mainland, DO  venlafaxine XR (EFFEXOR XR) 37.5 MG 24 hr capsule Take 1 tablet by mouth daily for 7 days then increase to 2 tablets daily as directed Patient not taking: Reported on 12/13/2020 08/30/20   Marrian Salvage, FNP    Allergies    Hydrocodone-acetaminophen and Latex  Review of Systems   Review of Systems  Constitutional:  Negative for chills, fatigue and fever.  HENT:  Negative for congestion, mouth sores, rhinorrhea and sneezing.   Eyes:  Negative for itching and visual disturbance.  Genitourinary:  Negative for dysuria and hematuria.  Musculoskeletal:  Negative for  arthralgias, gait problem and joint swelling.  Skin:  Positive for rash.  Neurological:  Negative for dizziness, light-headedness and headaches.  All other systems reviewed and are negative.  Physical Exam Updated Vital Signs BP (!) 144/90 (BP Location: Left Arm)   Pulse 98   Temp 99.2 F (37.3 C) (Oral)   Resp 18   Ht '5\' 2"'$  (1.575 m)   Wt 73 kg   LMP 08/31/2016   SpO2 99%   BMI 29.45 kg/m   Physical Exam Vitals and nursing note reviewed.  Constitutional:      Appearance: Normal appearance.  HENT:     Head: Normocephalic and atraumatic.  Eyes:     General: No scleral icterus.    Conjunctiva/sclera: Conjunctivae normal.  Cardiovascular:     Rate and Rhythm: Normal rate and regular rhythm.  Pulmonary:     Effort: Pulmonary effort is normal. No respiratory distress.  Musculoskeletal:     Cervical back: Normal range of motion.  Skin:    General: Skin is warm and dry.     Findings: Rash present.     Comments: Small area of erythema with 3-4 papules noted to the radial aspect of the volar wrist.  No signs of excoriation, bleeding or cellulitis.  No weeping.  Neurological:     Mental Status: She is alert.  Psychiatric:        Mood and Affect: Mood normal.    ED Results / Procedures / Treatments   Labs (all labs ordered are listed, but only abnormal results are displayed) Labs Reviewed - No data to display  EKG None  Radiology No results found.  Procedures Procedures   Medications Ordered in ED Medications - No data to display  ED Course  I have reviewed the triage vital signs and the nursing notes.  Pertinent labs & imaging results that were available during my care of the patient were reviewed by me and considered in my medical decision making (see chart for details).    MDM Rules/Calculators/A&P 45 year old female presented today with complaints of a rash.  She was concerned that it may be mokeypox so she decided to come in.  Rash has been present for 2  weeks and appeared shortly after she began using new soap.  Because patient has no systemic symptoms or rash on other parts of her body I believe that the patient has a likely contact dermatitis with her new soap.  Rash not consistent with monkey box or scabies.  I will treat her with hydrocortisone and tell her to follow-up with primary care or urgent care if things get  worse or do not improve over the next week.  Final Clinical Impression(s) / ED Diagnoses Final diagnoses:  Irritant contact dermatitis, unspecified trigger    Rx / DC Orders Results and diagnoses were explained to the patient. Return precautions discussed in full. Patient had no additional questions and expressed complete understanding.     Darliss Ridgel 12/29/20 1602    Godfrey Pick, MD 12/30/20 [phone]

## 2020-12-31 ENCOUNTER — Telehealth: Payer: Self-pay | Admitting: *Deleted

## 2020-12-31 NOTE — Telephone Encounter (Signed)
Transition Care Management Unsuccessful Follow-up Telephone Call  Date of discharge and from where:  12/29/2020 - [facility]  Attempts:  1st Attempt  Reason for unsuccessful TCM follow-up call:  Unable to leave message

## 2021-01-01 NOTE — Telephone Encounter (Signed)
Transition Care Management Follow-up Telephone Call Date of discharge and from where: 12/29/2020 - [facility] How have you been since you were released from the hospital? "Doing better" Any questions or concerns? No  Items Reviewed: Did the pt receive and understand the discharge instructions provided? Yes  Medications obtained and verified? Yes  Other? No  Any new allergies since your discharge? No  Dietary orders reviewed? No Do you have support at home? Yes    Functional Questionnaire: (I = Independent and D = Dependent) ADLs: I  Bathing/Dressing- I  Meal Prep- I  Eating- I  Maintaining continence- I  Transferring/Ambulation- I  Managing Meds- I  Follow up appointments reviewed:  PCP Hospital f/u appt confirmed? No  - advised patient that if her rash returns to call her PCP for an appointment [facility] f/u appt confirmed? No   Are transportation arrangements needed? No  If their condition worsens, is the pt aware to call PCP or go to the Emergency Dept.? Yes Was the patient provided with contact information for the PCP's office or ED? Yes Was to pt encouraged to call back with questions or concerns? Yes

## 2021-01-09 ENCOUNTER — Telehealth: Payer: Self-pay | Admitting: *Deleted

## 2021-01-09 ENCOUNTER — Institutional Professional Consult (permissible substitution): Payer: 59 | Admitting: Neurology

## 2021-01-09 NOTE — Telephone Encounter (Signed)
[  11:23 AM] Star Age please send ph note for dismissal to Angie on the 11 AM pt, I believe she has NS x 2

## 2021-01-09 NOTE — Telephone Encounter (Signed)
Patient no showed sleep consult today. She also no showed her sleep consult on 11/01/20.

## 2021-01-09 NOTE — Telephone Encounter (Signed)
Pt no showed appt x 2.

## 2021-01-10 ENCOUNTER — Encounter: Payer: Self-pay | Admitting: Neurology

## 2021-03-07 ENCOUNTER — Emergency Department (HOSPITAL_BASED_OUTPATIENT_CLINIC_OR_DEPARTMENT_OTHER): Admission: EM | Admit: 2021-03-07 | Discharge: 2021-03-07 | Discharge status: Against Medical Advice | Payer: 59

## 2021-03-07 ENCOUNTER — Other Ambulatory Visit: Payer: Self-pay

## 2021-03-07 NOTE — ED Notes (Signed)
Pt called 3 times w/o answer.

## 2021-03-11 ENCOUNTER — Telehealth: Payer: Self-pay

## 2021-03-11 NOTE — Telephone Encounter (Signed)
Pt had made an appt for chest pain via her mychart on Friday, 03/08/21.  In reviewing the schedule for tomorrow, I realized the pt had been to the ER on Thursday, 03/07/21, but left without being seen.  Before Sheketia, CMA, left for the evening, she called the pt to triage her and pt stated she had the flu last week and was probably not coming in tomorrow since she was on the way into her job.  Pt stated she did not have chest pain as in heart issues, but pain from coughing.  Pt was asked if she could take a Covid test prior to her appt tomorrow and she stated no due to her job and then she was asked if she would like to be seen virtually for her lingering symptoms. Pt accepted a virtual visit in place of an office visit.

## 2021-03-12 ENCOUNTER — Telehealth: Payer: 59 | Admitting: Family

## 2021-03-12 ENCOUNTER — Other Ambulatory Visit: Payer: Self-pay

## 2021-05-20 ENCOUNTER — Ambulatory Visit (INDEPENDENT_AMBULATORY_CARE_PROVIDER_SITE_OTHER): Payer: 59 | Admitting: Family Medicine

## 2021-05-20 ENCOUNTER — Other Ambulatory Visit (HOSPITAL_BASED_OUTPATIENT_CLINIC_OR_DEPARTMENT_OTHER): Payer: Self-pay

## 2021-05-20 VITALS — BP 120/80 | Ht 62.0 in | Wt 155.0 lb

## 2021-05-20 DIAGNOSIS — M5412 Radiculopathy, cervical region: Secondary | ICD-10-CM

## 2021-05-20 MED ORDER — GABAPENTIN 300 MG PO CAPS
300.0000 mg | ORAL_CAPSULE | Freq: Three times a day (TID) | ORAL | 1 refills | Status: DC
  Filled 2021-05-20: qty 60, 20d supply, fill #0
  Filled 2022-01-29: qty 60, 20d supply, fill #1

## 2021-05-20 NOTE — Assessment & Plan Note (Signed)
Acute on chronic in nature.  We have tried medications and home exercise therapy.  We have tried injection.  Pain is still ongoing and most consistent with radicular pain.   -Counseled on home exercise therapy and supportive care. -Gabapentin. -MRI of the cervical spine to evaluate for nerve impingement and consideration of epidural.

## 2021-05-20 NOTE — Progress Notes (Signed)
°  Christina Valenzuela - 46 y.o. female MRN [id number]  Date of birth: 1975-09-30  SUBJECTIVE:  Including CC & ROS.  No chief complaint on file.   Christina Valenzuela is a 46 y.o. female that is presenting with acute on chronic right arm pain.  She has tried a subacromial injection in the past with no improvement.  The pain extending from the neck down to the hand.  It is intermittent in nature.  Can be severe at times.  Has been ongoing for more than a year.  Has been monitored for over 6 weeks home exercise therapy by a physician.  Has tried medications..  Reviewed emergency room note from 6/18 shows she was provided Tylenol.   Review of Systems See HPI   HISTORY: Past Medical, Surgical, Social, and Family History Reviewed & Updated per EMR.   Pertinent Historical Findings include:  Past Medical History:  Diagnosis Date   Depression     Past Surgical History:  Procedure Laterality Date   ABDOMINAL HYSTERECTOMY  11/28/2016   [facility] - Dr Zigmund Daniel.  UTERINE LEIOMYOMA w/ MENORRHAGIA   CESAREAN SECTION     CESAREAN SECTION     CHOLECYSTECTOMY N/A 01/05/2017   Procedure: LAPAROSCOPIC CHOLECYSTECTOMY WITH INTRAOPERATIVE CHOLANGIOGRAM;  Surgeon: Michael Boston, MD;  Location: [facility];  Service: General;  Laterality: N/A;   EPIGASTRIC HERNIA REPAIR     with mesh.  Dr Excell Seltzer   TUBAL LIGATION       PHYSICAL EXAM:  VS: BP 120/80    Ht 5\' 2"  (1.575 m)    Wt 155 lb (70.3 kg)    LMP 08/31/2016    BMI 28.35 kg/m  Physical Exam Gen: NAD, alert, cooperative with exam, well-appearing MSK:  Neurovascularly intact       ASSESSMENT & PLAN:   Cervical radiculopathy Acute on chronic in nature.  We have tried medications and home exercise therapy.  We have tried injection.  Pain is still ongoing and most consistent with radicular pain.   -Counseled on home exercise therapy and supportive care. -Gabapentin. -MRI of the cervical spine to evaluate for nerve impingement and consideration of  epidural.

## 2021-05-21 ENCOUNTER — Encounter: Payer: Self-pay | Admitting: General Practice

## 2021-05-23 ENCOUNTER — Other Ambulatory Visit (HOSPITAL_BASED_OUTPATIENT_CLINIC_OR_DEPARTMENT_OTHER): Payer: Self-pay

## 2021-05-23 ENCOUNTER — Telehealth: Payer: 59 | Admitting: Physician Assistant

## 2021-05-23 DIAGNOSIS — H6982 Other specified disorders of Eustachian tube, left ear: Secondary | ICD-10-CM | POA: Diagnosis not present

## 2021-05-23 MED ORDER — FLUTICASONE PROPIONATE 50 MCG/ACT NA SUSP
2.0000 | Freq: Every day | NASAL | 0 refills | Status: DC
  Filled 2021-05-23: qty 16, 30d supply, fill #0

## 2021-05-23 NOTE — Progress Notes (Signed)
Virtual Visit Consent   Christina Valenzuela, you are scheduled for a virtual visit with a [facility] provider today.     Just as with appointments in the office, your consent must be obtained to participate.  Your consent will be active for this visit and any virtual visit you may have with one of our providers in the next 365 days.     If you have a MyChart account, a copy of this consent can be sent to you electronically.  All virtual visits are billed to your insurance company just like a traditional visit in the office.    As this is a virtual visit, video technology does not allow for your provider to perform a traditional examination.  This may limit your provider's ability to fully assess your condition.  If your provider identifies any concerns that need to be evaluated in person or the need to arrange testing (such as labs, EKG, etc.), we will make arrangements to do so.     Although advances in technology are sophisticated, we cannot ensure that it will always work on either your end or our end.  If the connection with a video visit is poor, the visit may have to be switched to a telephone visit.  With either a video or telephone visit, we are not always able to ensure that we have a secure connection.     I need to obtain your verbal consent now.   Are you willing to proceed with your visit today?    Christina Valenzuela has provided verbal consent on 05/23/2021 for a virtual visit (video or telephone).   Leeanne Rio, [place]   Date: 05/23/2021 1:50 PM   Virtual Visit via Video Note   I, Leeanne Rio, connected with  Christina Valenzuela  ([id number], 20-Sep-1975) on 05/23/21 at  1:45 PM EST by a video-enabled telemedicine application and verified that I am speaking with the correct person using two identifiers.  Location: Patient: Virtual Visit Location Patient: Home Provider: Virtual Visit Location Provider: Home Office   I discussed the limitations of evaluation and  management by telemedicine and the availability of in person appointments. The patient expressed understanding and agreed to proceed.    History of Present Illness: Christina Valenzuela is a 46 y.o. who identifies as a female who was assigned female at birth, and is being seen today for L ear pressure and muffled hearing -- popped on its own and symptoms improved. Over the rest of the day she noted clogged sensations off and on and pressure. Denies fevers, chills, aches. Denies symptoms of R ear. Denies other URI symptoms.   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   Mixed incontinence 10/24/2016   Chronic anemia 09/08/2015   Pica 09/08/2015   Primary insomnia 08/22/2015   Depression with anxiety 08/22/2015   Alcohol abuse 08/22/2015   Headache 08/22/2015   UTI symptoms 02/16/2014   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    Allergies:  Allergies  Allergen Reactions   Hydrocodone-Acetaminophen Itching   Latex Swelling    Vaginal swelling   Medications:  Current Outpatient Medications:    fluticasone (FLONASE) 50 MCG/ACT nasal spray, Place 2 sprays into both nostrils daily., Disp: 16 g, Rfl: 0   Boric Acid Vaginal 600 MG SUPP, Place 1 suppository vaginally 3 (three) times a week., Disp: 90 suppository, Rfl: 1   diphenhydrAMINE (BENADRYL) 25 MG tablet, Take 50 mg by mouth at bedtime as needed., Disp: , Rfl:    gabapentin (NEURONTIN) 300 MG capsule, Take 1 capsule (300 mg total) by mouth 3 (three) times daily., Disp: 60 capsule, Rfl: 1   hydrocortisone cream 1 %, Apply to affected  area 2 times daily, Disp: 15 g, Rfl: 0  Observations/Objective: Patient is well-developed, well-nourished in no acute distress.  Resting comfortably at home.  Head is normocephalic, atraumatic.  No labored breathing. Speech is clear and coherent with logical content.  Patient is alert and oriented at baseline.   Assessment and Plan: 1. Eustachian tube dysfunction, left - fluticasone (FLONASE) 50 MCG/ACT nasal spray; Place 2 sprays into both nostrils daily.  Dispense: 16 g; Refill: 0  Mild. Unilateral. No symptoms indicative of AOM at present. Continue antihistamine. Can start OTC sudafed. Flonase per orders. Follow-up with PCP if not resolving.   Follow Up Instructions: I discussed the assessment and treatment plan with the patient. The patient was provided an opportunity to ask questions and all were answered. The patient agreed with the plan and demonstrated an understanding of the instructions.  A copy of instructions were sent to the patient via MyChart unless otherwise noted below.   The patient was advised to call back or seek an in-person evaluation if the symptoms worsen or if the condition fails to improve as anticipated.  Time:  I spent 10 minutes with the patient via telehealth technology discussing the above problems/concerns.    Leeanne Rio, PA-C

## 2021-05-23 NOTE — Patient Instructions (Signed)
°  Vonzella Nipple, thank you for joining Leeanne Rio, PA-C for today's virtual visit.  While this provider is not your primary care provider (PCP), if your PCP is located in our provider database this encounter information will be shared with them immediately following your visit.  Consent: (Patient) Vonzella Nipple provided verbal consent for this virtual visit at the beginning of the encounter.  Current Medications:  Current Outpatient Medications:    Boric Acid Vaginal 600 MG SUPP, Place 1 suppository vaginally 3 (three) times a week., Disp: 90 suppository, Rfl: 1   diphenhydrAMINE (BENADRYL) 25 MG tablet, Take 50 mg by mouth at bedtime as needed., Disp: , Rfl:    gabapentin (NEURONTIN) 300 MG capsule, Take 1 capsule (300 mg total) by mouth 3 (three) times daily., Disp: 60 capsule, Rfl: 1   hydrocortisone cream 1 %, Apply to affected area 2 times daily, Disp: 15 g, Rfl: 0   Medications ordered in this encounter:  No orders of the defined types were placed in this encounter.    *If you need refills on other medications prior to your next appointment, please contact your pharmacy*  Follow-Up: Call back or seek an in-person evaluation if the symptoms worsen or if the condition fails to improve as anticipated.  Other Instructions  If you have been instructed to have an in-person evaluation today at a local Urgent Care facility, please use the link below. It will take you to a list of all of our available [facility], including address, phone number and hours of operation. Please do not delay care.  [facility]  If you or a family member do not have a primary care provider, use the link below to schedule a visit and establish care. When you choose a [facility] primary care physician or advanced practice provider, you gain a long-term partner in health. Find a Primary Care Provider  Learn more about [facility]'s in-office and virtual care  options: [facility]

## 2021-06-08 ENCOUNTER — Other Ambulatory Visit: Payer: Self-pay

## 2021-06-08 ENCOUNTER — Ambulatory Visit
Admission: RE | Admit: 2021-06-08 | Discharge: 2021-06-08 | Disposition: A | Discharge status: Home / Self Care | Payer: 59 | Source: Ambulatory Visit | Attending: Family Medicine | Admitting: Family Medicine

## 2021-06-08 DIAGNOSIS — M5412 Radiculopathy, cervical region: Secondary | ICD-10-CM

## 2021-06-08 DIAGNOSIS — M542 Cervicalgia: Secondary | ICD-10-CM | POA: Diagnosis not present

## 2021-06-10 ENCOUNTER — Telehealth (INDEPENDENT_AMBULATORY_CARE_PROVIDER_SITE_OTHER): Payer: 59 | Admitting: Family Medicine

## 2021-06-10 ENCOUNTER — Encounter: Payer: Self-pay | Admitting: Family Medicine

## 2021-06-10 DIAGNOSIS — M5412 Radiculopathy, cervical region: Secondary | ICD-10-CM | POA: Diagnosis not present

## 2021-06-10 NOTE — Progress Notes (Signed)
Virtual Visit via Video Note  I connected with Christina Valenzuela on 06/10/21 at  1:10 PM EST by a video enabled telemedicine application and verified that I am speaking with the correct person using two identifiers.  Location: Patient: office Provider: home   I discussed the limitations of evaluation and management by telemedicine and the availability of in person appointments. The patient expressed understanding and agreed to proceed.  History of Present Illness:  Christina Valenzuela is a 46 year old female that I saw on the MRI of her cervical spine.  This was showing no significant structural pathology.   Observations/Objective:   Assessment and Plan:  Cervical radiculopathy: Has been doing well over the past couple of weeks.  MRI was reassuring for no significant pathology. -Counseled on home exercise therapy and supportive care. -Could consider a diagnostic epidural.  Follow Up Instructions:    I discussed the assessment and treatment plan with the patient. The patient was provided an opportunity to ask questions and all were answered. The patient agreed with the plan and demonstrated an understanding of the instructions.   The patient was advised to call back or seek an in-person evaluation if the symptoms worsen or if the condition fails to improve as anticipated.    Clearance Coots, MD

## 2021-06-10 NOTE — Assessment & Plan Note (Signed)
Has been doing well over the past couple of weeks.  MRI was reassuring for no significant pathology. -Counseled on home exercise therapy and supportive care. -Could consider a diagnostic epidural.

## 2021-07-01 IMAGING — DX PORTABLE CHEST - 1 VIEW
1 series · 1 of 1 positions shown · non-contrast
Comparison: December 30, 2017

CLINICAL DATA: Chest pain for 3 days

EXAM:
PORTABLE CHEST 1 VIEW

[chest ap]
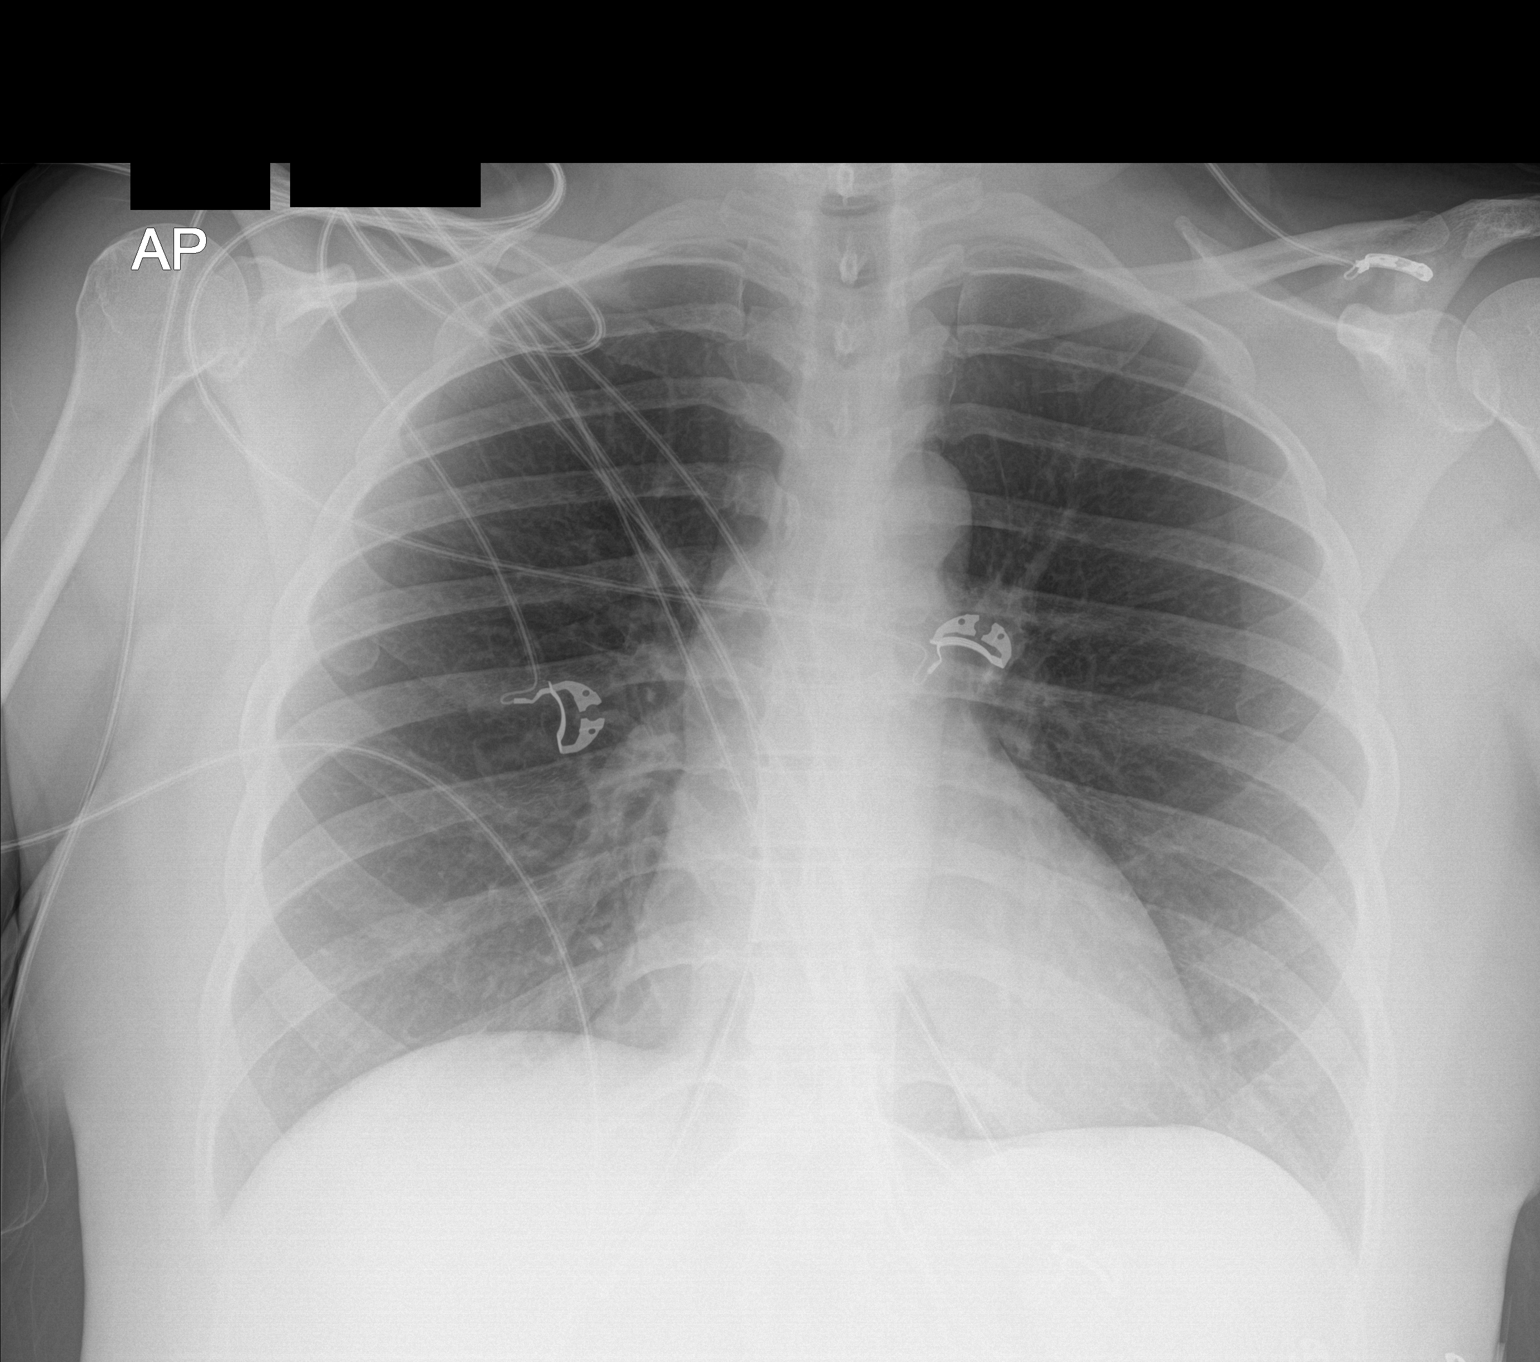

[1 of 1 positions shown; findings below may reference images not displayed]

FINDINGS: The lungs are clear without focal consolidation, edema, effusion or
pneumothorax. Cardiomediastinal silhouette is within normal limits
for size. No acute osseous findings.
IMPRESSION: No acute cardiopulmonary process.

## 2021-07-25 ENCOUNTER — Other Ambulatory Visit: Payer: Self-pay

## 2021-07-25 ENCOUNTER — Encounter: Payer: Self-pay | Admitting: Family Medicine

## 2021-07-25 ENCOUNTER — Other Ambulatory Visit (HOSPITAL_COMMUNITY)
Admission: RE | Admit: 2021-07-25 | Discharge: 2021-07-25 | Disposition: A | Discharge status: Home / Self Care | Payer: 59 | Source: Ambulatory Visit | Attending: Family Medicine | Admitting: Family Medicine

## 2021-07-25 ENCOUNTER — Ambulatory Visit (INDEPENDENT_AMBULATORY_CARE_PROVIDER_SITE_OTHER): Payer: 59 | Admitting: Family Medicine

## 2021-07-25 VITALS — BP 113/94 | HR 98 | Ht 62.0 in | Wt 173.0 lb

## 2021-07-25 DIAGNOSIS — N898 Other specified noninflammatory disorders of vagina: Secondary | ICD-10-CM

## 2021-07-25 DIAGNOSIS — Z7721 Contact with and (suspected) exposure to potentially hazardous body fluids: Secondary | ICD-10-CM | POA: Insufficient documentation

## 2021-07-25 DIAGNOSIS — Z1239 Encounter for other screening for malignant neoplasm of breast: Secondary | ICD-10-CM | POA: Diagnosis not present

## 2021-07-25 DIAGNOSIS — Z01419 Encounter for gynecological examination (general) (routine) without abnormal findings: Secondary | ICD-10-CM

## 2021-07-25 NOTE — Progress Notes (Signed)
Pt states she may have yeast infection.  ?Pt would like all testing today, including STD.  ? ?Last mammo 07/2020 ? ? ?  ? ? ?

## 2021-07-25 NOTE — Progress Notes (Signed)
? ? ?GYNECOLOGY ANNUAL PREVENTATIVE CARE ENCOUNTER NOTE ? ?Subjective:  ? Christina Valenzuela is a 46 y.o. [phone] female here for a routine annual gynecologic exam.  Current complaints: vaginal irritation, which is recurrent. Boric acid helpful. Would like STI testing. Having occasional hot flashes, but having a lot of vaginal dryness, which may be leading to vaginal irritation.   Denies abnormal vaginal bleeding, discharge, pelvic pain, problems with intercourse or other gynecologic concerns.  ?  ?Gynecologic History ?Patient's last menstrual period was 08/31/2016. ?Patient is sexually active  ?Contraception: status post hysterectomy ? ?Last mammogram: 2022. Results were: birads 3 - fat necrosis, recommendation to do bilateral diagnostic mammogram in 10/2020 ? ? ? ?The pregnancy intention screening data noted above was reviewed. Potential methods of contraception were discussed. The patient elected to proceed with No data recorded. ? ? ?Obstetric History ?OB History  ?Gravida Para Term Preterm AB Living  ?'8 4 3 1 4 5  '$ ?SAB IAB Ectopic Multiple Live Births  ?2 2 0 1 5  ?  ?# Outcome Date GA Lbr Len/2nd Weight Sex Delivery Anes PTL Lv  ?8 Term 11/14/06 [redacted]w[redacted]d  F CS-LTranv EPI  LIV  ?7 Term 02/02/04 456w0d6 lb (2.722 kg) M VBAC EPI  LIV  ?6 Term 04/27/92 4060w0dM Vag-Spont EPI  LIV  ?5 IAB         DEC  ?4 IAB         DEC  ?3 SAB         DEC  ?2 SAB         DEC  ?1A Preterm 08/04/96 27w66w0dlb 4 oz (0.567 kg) F CS-LTranv EPI  LIV  ?1B Preterm    1 lb 5 oz (0.595 kg) F CS-LTranv EPI  LIV  ? ? ?Past Medical History:  ?Diagnosis Date  ? Depression   ? ? ?Past Surgical History:  ?Procedure Laterality Date  ? ABDOMINAL HYSTERECTOMY  11/28/2016  ? [facility] MattZigmund DanielTERINE LEIOMYOMA w/ MENORRHAGIA  ? CESAREAN SECTION    ? CESAREAN SECTION    ? CHOLECYSTECTOMY N/A 01/05/2017  ? Procedure: LAPAROSCOPIC CHOLECYSTECTOMY WITH INTRAOPERATIVE CHOLANGIOGRAM;  Surgeon: GrosMichael Boston;  Location: [facility];  Service: General;   Laterality: N/A;  ? EPIGASTRIC HERNIA REPAIR    ? with mesh.  Dr HoxwExcell SeltzerTUBAL LIGATION    ? ? ?Current Outpatient Medications on File Prior to Visit  ?Medication Sig Dispense Refill  ? diphenhydrAMINE (BENADRYL) 25 MG tablet Take 50 mg by mouth at bedtime as needed.    ? gabapentin (NEURONTIN) 300 MG capsule Take 1 capsule (300 mg total) by mouth 3 (three) times daily. 60 capsule 1  ? Boric Acid Vaginal 600 MG SUPP Place 1 suppository vaginally 3 (three) times a week. 90 suppository 1  ? fluticasone (FLONASE) 50 MCG/ACT nasal spray Place 2 sprays into both nostrils daily. 16 g 0  ? hydrocortisone cream 1 % Apply to affected area 2 times daily 15 g 0  ? ?No current facility-administered medications on file prior to visit.  ? ? ?Allergies  ?Allergen Reactions  ? Hydrocodone-Acetaminophen Itching  ? Latex Swelling  ?  Vaginal swelling  ? ? ?Social History  ? ?Socioeconomic History  ? Marital status: Married  ?  Spouse name: Not on file  ? Number of children: 5  ? Years of education: 12  59Highest education level: Not on file  ?Occupational History  ?  Not on file  ?Tobacco Use  ? Smoking status: Every Day  ?  Packs/day: 0.25  ?  Types: Cigarettes  ? Smokeless tobacco: Never  ?Vaping Use  ? Vaping Use: Never used  ?Substance and Sexual Activity  ? Alcohol use: Not Currently  ? Drug use: No  ? Sexual activity: Yes  ?  Partners: Male  ?  Birth control/protection: Surgical  ?Other Topics Concern  ? Not on file  ?Social History Narrative  ? Drinks caffeine   ? ?Social Determinants of Health  ? ?Financial Resource Strain: Not on file  ?Food Insecurity: Not on file  ?Transportation Needs: Not on file  ?Physical Activity: Not on file  ?Stress: Not on file  ?Social Connections: Not on file  ?Intimate Partner Violence: Not on file  ? ? ?Family History  ?Problem Relation Age of Onset  ? Diabetes Maternal Grandfather   ? ? ?The following portions of the patient's history were reviewed and updated as appropriate: allergies,  current medications, past family history, past medical history, past social history, past surgical history and problem list. ? ?Review of Systems ?Pertinent items are noted in HPI. ?  ?Objective:  ?BP (!) 113/94   Pulse 98   Ht '5\' 2"'$  (1.575 m)   Wt 173 lb (78.5 kg)   LMP 08/31/2016   BMI 31.64 kg/m?  ?Wt Readings from Last 3 Encounters:  ?07/25/21 173 lb (78.5 kg)  ?06/10/21 155 lb (70.3 kg)  ?05/20/21 155 lb (70.3 kg)  ?  ? ?Chaperone present during exam ? ?CONSTITUTIONAL: Well-developed, well-nourished female in no acute distress.  ?HENT:  Normocephalic, atraumatic, External right and left ear normal. Oropharynx is clear and moist ?EYES: Conjunctivae and EOM are normal. Pupils are equal, round, and reactive to light. No scleral icterus.  ?NECK: Normal range of motion, supple, no masses.  Normal thyroid.  ? ?CARDIOVASCULAR: Normal heart rate noted, regular rhythm ?RESPIRATORY: Clear to auscultation bilaterally. Effort and breath sounds normal, no problems with respiration noted. ?BREASTS: Symmetric in size. No masses, skin changes, nipple drainage, or lymphadenopathy. ?ABDOMEN: Soft, normal bowel sounds, no distention noted.  No tenderness, rebound or guarding.  ?PELVIC: Normal appearing external genitalia; normal appearing vaginal mucosa and cervix.  No abnormal discharge noted.  Normal uterine size, no other palpable masses, no uterine or adnexal tenderness. ?MUSCULOSKELETAL: Normal range of motion. No tenderness.  No cyanosis, clubbing, or edema.  2+ distal pulses. ?SKIN: Skin is warm and dry. No rash noted. Not diaphoretic. No erythema. No pallor. ?NEUROLOGIC: Alert and oriented to person, place, and time. Normal reflexes, muscle tone coordination. No cranial nerve deficit noted. ?PSYCHIATRIC: Normal mood and affect. Normal behavior. Normal judgment and thought content. ? ?Assessment:  ?Annual gynecologic examination with pap smear ?  ?Plan:  ?1. Well Woman Exam ?Will follow up results of pap smear and  manage accordingly. ?Mammogram scheduled ?STD testing discussed. Patient requested testing ? ?2. Vaginal irritation ?Check vaginal culture ? ?3. Exposure to body fluid ?- Cervicovaginal ancillary only( [place]) ?- Hepatitis B surface antigen ?- RPR ?- HIV Antibody (routine testing w rflx) ?- Hepatitis C antibody ? ? ?Routine preventative health maintenance measures emphasized. ?Please refer to After Visit Summary for other counseling recommendations.  ? ? ?Loma Boston, DO ?[facility] ? ?

## 2021-07-26 ENCOUNTER — Other Ambulatory Visit (HOSPITAL_BASED_OUTPATIENT_CLINIC_OR_DEPARTMENT_OTHER): Payer: Self-pay

## 2021-07-26 LAB — HEPATITIS B SURFACE ANTIGEN: Hepatitis B Surface Ag: NEGATIVE

## 2021-07-26 LAB — CERVICOVAGINAL ANCILLARY ONLY
Bacterial Vaginitis (gardnerella): POSITIVE — AB
Candida Glabrata: NEGATIVE
Candida Vaginitis: NEGATIVE
Chlamydia: NEGATIVE
Comment: NEGATIVE
Comment: NEGATIVE
Comment: NEGATIVE
Comment: NEGATIVE
Comment: NEGATIVE
Comment: NORMAL
Neisseria Gonorrhea: NEGATIVE
Trichomonas: NEGATIVE

## 2021-07-26 LAB — HIV ANTIBODY (ROUTINE TESTING W REFLEX): HIV Screen 4th Generation wRfx: NONREACTIVE

## 2021-07-26 LAB — RPR: RPR Ser Ql: NONREACTIVE

## 2021-07-26 LAB — HEPATITIS C ANTIBODY: Hep C Virus Ab: NONREACTIVE

## 2021-07-26 MED ORDER — METRONIDAZOLE 500 MG PO TABS
500.0000 mg | ORAL_TABLET | Freq: Two times a day (BID) | ORAL | 0 refills | Status: DC
  Filled 2021-07-26: qty 14, 7d supply, fill #0

## 2021-07-26 NOTE — Addendum Note (Signed)
Addended by: Truett Mainland on: 07/26/2021 12:57 PM ? ? Modules accepted: Orders ? ?

## 2021-08-20 ENCOUNTER — Other Ambulatory Visit (HOSPITAL_BASED_OUTPATIENT_CLINIC_OR_DEPARTMENT_OTHER): Payer: Self-pay

## 2021-08-20 ENCOUNTER — Other Ambulatory Visit (HOSPITAL_COMMUNITY)
Admission: RE | Admit: 2021-08-20 | Discharge: 2021-08-20 | Disposition: A | Discharge status: Home / Self Care | Payer: 59 | Source: Ambulatory Visit | Attending: Advanced Practice Midwife | Admitting: Advanced Practice Midwife

## 2021-08-20 ENCOUNTER — Ambulatory Visit: Payer: 59

## 2021-08-20 VITALS — BP 133/87 | HR 97 | Wt 172.0 lb

## 2021-08-20 DIAGNOSIS — Z113 Encounter for screening for infections with a predominantly sexual mode of transmission: Secondary | ICD-10-CM

## 2021-08-20 DIAGNOSIS — B9689 Other specified bacterial agents as the cause of diseases classified elsewhere: Secondary | ICD-10-CM | POA: Insufficient documentation

## 2021-08-20 DIAGNOSIS — R109 Unspecified abdominal pain: Secondary | ICD-10-CM | POA: Insufficient documentation

## 2021-08-20 DIAGNOSIS — B379 Candidiasis, unspecified: Secondary | ICD-10-CM

## 2021-08-20 DIAGNOSIS — N760 Acute vaginitis: Secondary | ICD-10-CM | POA: Diagnosis not present

## 2021-08-20 MED ORDER — FLUCONAZOLE 150 MG PO TABS
150.0000 mg | ORAL_TABLET | Freq: Once | ORAL | 0 refills | Status: AC
  Filled 2021-08-20 – 2021-09-03 (×2): qty 1, 1d supply, fill #0

## 2021-08-20 NOTE — Progress Notes (Addendum)
Pt presents for STI screening. Pt states she has been having abdominal pain and she thinks she has a yeast infection.Pt declines blood STI screening at this time. Diflucan was sent to the pharmacy and self swab was sent to the lab. ?Christina Valenzuela l Reanne Nellums, CMA  ?

## 2021-08-21 LAB — CERVICOVAGINAL ANCILLARY ONLY
Bacterial Vaginitis (gardnerella): POSITIVE — AB
Candida Glabrata: NEGATIVE
Candida Vaginitis: NEGATIVE
Chlamydia: NEGATIVE
Comment: NEGATIVE
Comment: NEGATIVE
Comment: NEGATIVE
Comment: NEGATIVE
Comment: NEGATIVE
Comment: NORMAL
Neisseria Gonorrhea: NEGATIVE
Trichomonas: NEGATIVE

## 2021-08-30 ENCOUNTER — Other Ambulatory Visit (HOSPITAL_BASED_OUTPATIENT_CLINIC_OR_DEPARTMENT_OTHER): Payer: Self-pay

## 2021-09-03 ENCOUNTER — Other Ambulatory Visit (HOSPITAL_COMMUNITY): Payer: Self-pay

## 2021-09-03 ENCOUNTER — Ambulatory Visit (INDEPENDENT_AMBULATORY_CARE_PROVIDER_SITE_OTHER): Payer: 59

## 2021-09-03 ENCOUNTER — Other Ambulatory Visit (HOSPITAL_BASED_OUTPATIENT_CLINIC_OR_DEPARTMENT_OTHER): Payer: Self-pay

## 2021-09-03 VITALS — BP 136/94 | HR 86 | Wt 172.0 lb

## 2021-09-03 DIAGNOSIS — R399 Unspecified symptoms and signs involving the genitourinary system: Secondary | ICD-10-CM | POA: Diagnosis not present

## 2021-09-03 LAB — POCT URINALYSIS DIPSTICK
Bilirubin, UA: NEGATIVE
Blood, UA: POSITIVE
Glucose, UA: NEGATIVE
Ketones, UA: NEGATIVE
Protein, UA: NEGATIVE
Spec Grav, UA: 1.025 (ref 1.010–1.025)
Urobilinogen, UA: 0.2 E.U./dL
pH, UA: 6 (ref 5.0–8.0)

## 2021-09-03 MED ORDER — NITROFURANTOIN MONOHYD MACRO 100 MG PO CAPS
100.0000 mg | ORAL_CAPSULE | Freq: Two times a day (BID) | ORAL | 0 refills | Status: DC
  Filled 2021-09-03: qty 14, 7d supply, fill #0

## 2021-09-03 MED ORDER — NITROFURANTOIN MONOHYD MACRO 100 MG PO CAPS
100.0000 mg | ORAL_CAPSULE | Freq: Two times a day (BID) | ORAL | 0 refills | Status: DC
  Filled 2021-09-03: qty 10, 5d supply, fill #0

## 2021-09-03 NOTE — Progress Notes (Addendum)
SUBJECTIVE: Christina Valenzuela is a 46 y.o. female who complains of urinary frequency, urgency and dysuria x 1 days, without flank pain, fever, chills, or abnormal vaginal discharge or bleeding.  ? ?OBJECTIVE: Appears well, in no apparent distress.  Vital signs are normal. Urine dipstick shows positive for nitrates, leukocytes, and RBCs.   ? ?ASSESSMENT: Dysuria ? ?PLAN: Treatment per orders.  Call or return to clinic prn if these symptoms worsen or fail to improve as anticipated.  ? ? ?Patient was assessed and managed by nursing staff during this encounter. I have reviewed the chart and agree with the documentation and plan. I have also made any necessary editorial changes. ? ?Hansel Feinstein, CNM ?09/03/2021 10:47 PM  ? ?

## 2021-09-03 NOTE — Addendum Note (Signed)
Addended by: Valentina Lucks on: 09/03/2021 02:01 PM ? ? Modules accepted: Orders ? ?

## 2021-09-07 LAB — URINE CULTURE

## 2021-10-22 ENCOUNTER — Other Ambulatory Visit (HOSPITAL_BASED_OUTPATIENT_CLINIC_OR_DEPARTMENT_OTHER): Payer: Self-pay

## 2021-10-22 ENCOUNTER — Telehealth: Payer: 59 | Admitting: Physician Assistant

## 2021-10-22 DIAGNOSIS — T3695XA Adverse effect of unspecified systemic antibiotic, initial encounter: Secondary | ICD-10-CM | POA: Diagnosis not present

## 2021-10-22 DIAGNOSIS — B379 Candidiasis, unspecified: Secondary | ICD-10-CM

## 2021-10-22 NOTE — Progress Notes (Signed)
Virtual Visit Consent   Christina Valenzuela, you are scheduled for a virtual visit with a [facility] provider today. Just as with appointments in the office, your consent must be obtained to participate. Your consent will be active for this visit and any virtual visit you may have with one of our providers in the next 365 days. If you have a MyChart account, a copy of this consent can be sent to you electronically.  As this is a virtual visit, video technology does not allow for your provider to perform a traditional examination. This may limit your provider's ability to fully assess your condition. If your provider identifies any concerns that need to be evaluated in person or the need to arrange testing (such as labs, EKG, etc.), we will make arrangements to do so. Although advances in technology are sophisticated, we cannot ensure that it will always work on either your end or our end. If the connection with a video visit is poor, the visit may have to be switched to a telephone visit. With either a video or telephone visit, we are not always able to ensure that we have a secure connection.  By engaging in this virtual visit, you consent to the provision of healthcare and authorize for your insurance to be billed (if applicable) for the services provided during this visit. Depending on your insurance coverage, you may receive a charge related to this service.  I need to obtain your verbal consent now. Are you willing to proceed with your visit today? Christina Valenzuela has provided verbal consent on 10/22/2021 for a virtual visit (video or telephone). Leeanne Rio, [place]  Date: 10/22/2021 4:00 PM  Virtual Visit via Video Note   I, Leeanne Rio, connected with  Christina Valenzuela  ([id number], [id number]) on 10/22/21 at  4:00 PM EDT by a video-enabled telemedicine application and verified that I am speaking with the correct person using two identifiers.  Location: Patient: Virtual  Visit Location Patient: Other: Work Provider: Ecologist: Home Office   I discussed the limitations of evaluation and management by telemedicine and the availability of in person appointments. The patient expressed understanding and agreed to proceed.    History of Present Illness: Christina Valenzuela is a 46 y.o. who identifies as a female who was assigned female at birth, and is being seen today for possible yeast infection after use of Metronidazole for BV by her GYN. Noted symptoms starting last week after completing her metronidazole. Notes some itch and vaginal discharge that is thick and white. Denies fever, chills. Denies urinary symptoms. Is s/p abdominal hysterectomy.   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   Mixed incontinence 10/24/2016   Chronic anemia 09/08/2015   Pica 09/08/2015   Primary insomnia 08/22/2015   Depression with anxiety 08/22/2015   Alcohol abuse 08/22/2015   Headache 08/22/2015   UTI symptoms 02/16/2014   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  Resting comfortably at home.  Head is normocephalic, atraumatic.  No labored breathing. Speech is  clear and coherent with logical content.  Patient is alert and oriented at baseline.   Assessment and Plan: 1. Antibiotic-induced yeast infection  Diflucan x 1. Supportive measures and OTC medications reviewed. Vaginal health probiotic recommended. Follow-up with PCP if not fully resolving or for any new/worsening symptoms despite treatment.   Follow Up Instructions: I discussed the assessment and treatment plan with the patient. The patient was provided an opportunity to ask questions and all were answered. The patient agreed with the plan and demonstrated an understanding of the instructions.  A copy of instructions were sent to the patient via MyChart unless otherwise noted below.   The patient was advised to call back or seek an in-person evaluation if the symptoms worsen or if the condition fails to improve as anticipated.  Time:  I spent 10 minutes with the patient via telehealth technology discussing the above problems/concerns.    Leeanne Rio, PA-C

## 2021-10-22 NOTE — Patient Instructions (Addendum)
Christina Valenzuela, thank you for joining Leeanne Rio, PA-C for today's virtual visit.  While this provider is not your primary care provider (PCP), if your PCP is located in our provider database this encounter information will be shared with them immediately following your visit.  Consent: (Patient) Christina Valenzuela provided verbal consent for this virtual visit at the beginning of the encounter.  Current Medications:  Current Outpatient Medications:    Boric Acid Vaginal 600 MG SUPP, Place 1 suppository vaginally 3 (three) times a week. (Patient not taking: Reported on 09/03/2021), Disp: 90 suppository, Rfl: 1   diphenhydrAMINE (BENADRYL) 25 MG tablet, Take 50 mg by mouth at bedtime as needed. (Patient not taking: Reported on 09/03/2021), Disp: , Rfl:    fluticasone (FLONASE) 50 MCG/ACT nasal spray, Place 2 sprays into both nostrils daily. (Patient not taking: Reported on 08/20/2021), Disp: 16 g, Rfl: 0   gabapentin (NEURONTIN) 300 MG capsule, Take 1 capsule (300 mg total) by mouth 3 (three) times daily., Disp: 60 capsule, Rfl: 1   hydrocortisone cream 1 %, Apply to affected area 2 times daily (Patient not taking: Reported on 09/03/2021), Disp: 15 g, Rfl: 0   metroNIDAZOLE (FLAGYL) 500 MG tablet, Take 1 tablet (500 mg total) by mouth 2 (two) times daily. (Patient not taking: Reported on 08/20/2021), Disp: 14 tablet, Rfl: 0   nitrofurantoin, macrocrystal-monohydrate, (MACROBID) 100 MG capsule, Take 1 capsule (100 mg total) by mouth 2 (two) times daily., Disp: 10 capsule, Rfl: 0   Medications ordered in this encounter:  No orders of the defined types were placed in this encounter.    *If you need refills on other medications prior to your next appointment, please contact your pharmacy*  Follow-Up: Call back or seek an in-person evaluation if the symptoms worsen or if the condition fails to improve as anticipated.  Other Instructions Take the Diflucan as directed. Start OTC Women's  probiotic for vaginal health -- AZO or Women's One a Day are some brand options.   Vaginal Yeast Infection, Adult  Vaginal yeast infection is a condition that causes vaginal discharge as well as soreness, swelling, and redness (inflammation) of the vagina. This is a common condition. Some women get this infection frequently. What are the causes? This condition is caused by a change in the normal balance of the yeast (Candida) and normal bacteria that live in the vagina. This change causes an overgrowth of yeast, which causes the inflammation. What increases the risk? The condition is more likely to develop in women who: Take antibiotic medicines. Have diabetes. Take birth control pills. Are pregnant. Douche often. Have a weak body defense system (immune system). Have been taking steroid medicines for a long time. Frequently wear tight clothing. What are the signs or symptoms? Symptoms of this condition include: White, thick, creamy vaginal discharge. Swelling, itching, redness, and irritation of the vagina. The lips of the vagina (labia) may be affected as well. Pain or a burning feeling while urinating. Pain during sex. How is this diagnosed? This condition is diagnosed based on: Your medical history. A physical exam. A pelvic exam. Your health care provider will examine a sample of your vaginal discharge under a microscope. Your health care provider may send this sample for testing to confirm the diagnosis. How is this treated? This condition is treated with medicine. Medicines may be over-the-counter or prescription. You may be told to use one or more of the following: Medicine that is taken by mouth (orally). Medicine that is  applied as a cream (topically). Medicine that is inserted directly into the vagina (suppository). Follow these instructions at home: Take or apply over-the-counter and prescription medicines only as told by your health care provider. Do not use tampons  until your health care provider approves. Do not have sex until your infection has cleared. Sex can prolong or worsen your symptoms of infection. Ask your health care provider when it is safe to resume sexual activity. Keep all follow-up visits. This is important. How is this prevented?  Do not wear tight clothes, such as pantyhose or tight pants. Wear breathable cotton underwear. Do not use douches, perfumed soap, creams, or powders. Wipe from front to back after using the toilet. If you have diabetes, keep your blood sugar levels under control. Ask your health care provider for other ways to prevent yeast infections. Contact a health care provider if: You have a fever. Your symptoms go away and then return. Your symptoms do not get better with treatment. Your symptoms get worse. You have new symptoms. You develop blisters in or around your vagina. You have blood coming from your vagina and it is not your menstrual period. You develop pain in your abdomen. Summary Vaginal yeast infection is a condition that causes discharge as well as soreness, swelling, and redness (inflammation) of the vagina. This condition is treated with medicine. Medicines may be over-the-counter or prescription. Take or apply over-the-counter and prescription medicines only as told by your health care provider. Do not douche. Resume sexual activity or use of tampons as instructed by your health care provider. Contact a health care provider if your symptoms do not get better with treatment or your symptoms go away and then return. This information is not intended to replace advice given to you by your health care provider. Make sure you discuss any questions you have with your health care provider. Document Revised: 07/09/2020 Document Reviewed: 07/09/2020 Elsevier Patient Education  [place].    If you have been instructed to have an in-person evaluation today at a local Urgent Care facility, please  use the link below. It will take you to a list of all of our available [facility], including address, phone number and hours of operation. Please do not delay care.  [facility]  If you or a family member do not have a primary care provider, use the link below to schedule a visit and establish care. When you choose a [facility] primary care physician or advanced practice provider, you gain a long-term partner in health. Find a Primary Care Provider  Learn more about [facility] in-office and virtual care options: [facility]

## 2021-10-24 ENCOUNTER — Encounter: Payer: Self-pay | Admitting: Family Medicine

## 2021-10-24 ENCOUNTER — Other Ambulatory Visit: Payer: Self-pay

## 2021-10-24 ENCOUNTER — Other Ambulatory Visit (HOSPITAL_BASED_OUTPATIENT_CLINIC_OR_DEPARTMENT_OTHER): Payer: Self-pay

## 2021-10-24 MED ORDER — FLUCONAZOLE 150 MG PO TABS
150.0000 mg | ORAL_TABLET | Freq: Once | ORAL | 0 refills | Status: AC
  Filled 2021-10-24: qty 1, 1d supply, fill #0

## 2021-12-02 ENCOUNTER — Other Ambulatory Visit (HOSPITAL_BASED_OUTPATIENT_CLINIC_OR_DEPARTMENT_OTHER): Payer: Self-pay

## 2021-12-02 ENCOUNTER — Encounter: Payer: Self-pay | Admitting: Family Medicine

## 2021-12-02 MED ORDER — METRONIDAZOLE 0.75 % VA GEL
1.0000 | Freq: Every day | VAGINAL | 1 refills | Status: DC
  Filled 2021-12-02: qty 70, 5d supply, fill #0
  Filled 2022-01-29: qty 70, 5d supply, fill #1

## 2021-12-02 MED ORDER — FLUCONAZOLE 150 MG PO TABS
150.0000 mg | ORAL_TABLET | Freq: Once | ORAL | 3 refills | Status: AC
  Filled 2021-12-02: qty 1, 1d supply, fill #0

## 2022-01-29 ENCOUNTER — Encounter: Payer: Self-pay | Admitting: Family Medicine

## 2022-01-29 ENCOUNTER — Other Ambulatory Visit (HOSPITAL_BASED_OUTPATIENT_CLINIC_OR_DEPARTMENT_OTHER): Payer: Self-pay

## 2022-01-29 MED ORDER — FLUCONAZOLE 150 MG PO TABS
150.0000 mg | ORAL_TABLET | Freq: Once | ORAL | 1 refills | Status: AC
  Filled 2022-01-29: qty 3, 6d supply, fill #0

## 2022-02-08 IMAGING — DX DG KNEE COMPLETE 4+V*R*
4 series · 4 of 4 positions shown · non-contrast
Comparison: None.

CLINICAL DATA: Bilateral knee pain for several months.

EXAM:
RIGHT KNEE - COMPLETE 4+ VIEW

[knee ap]
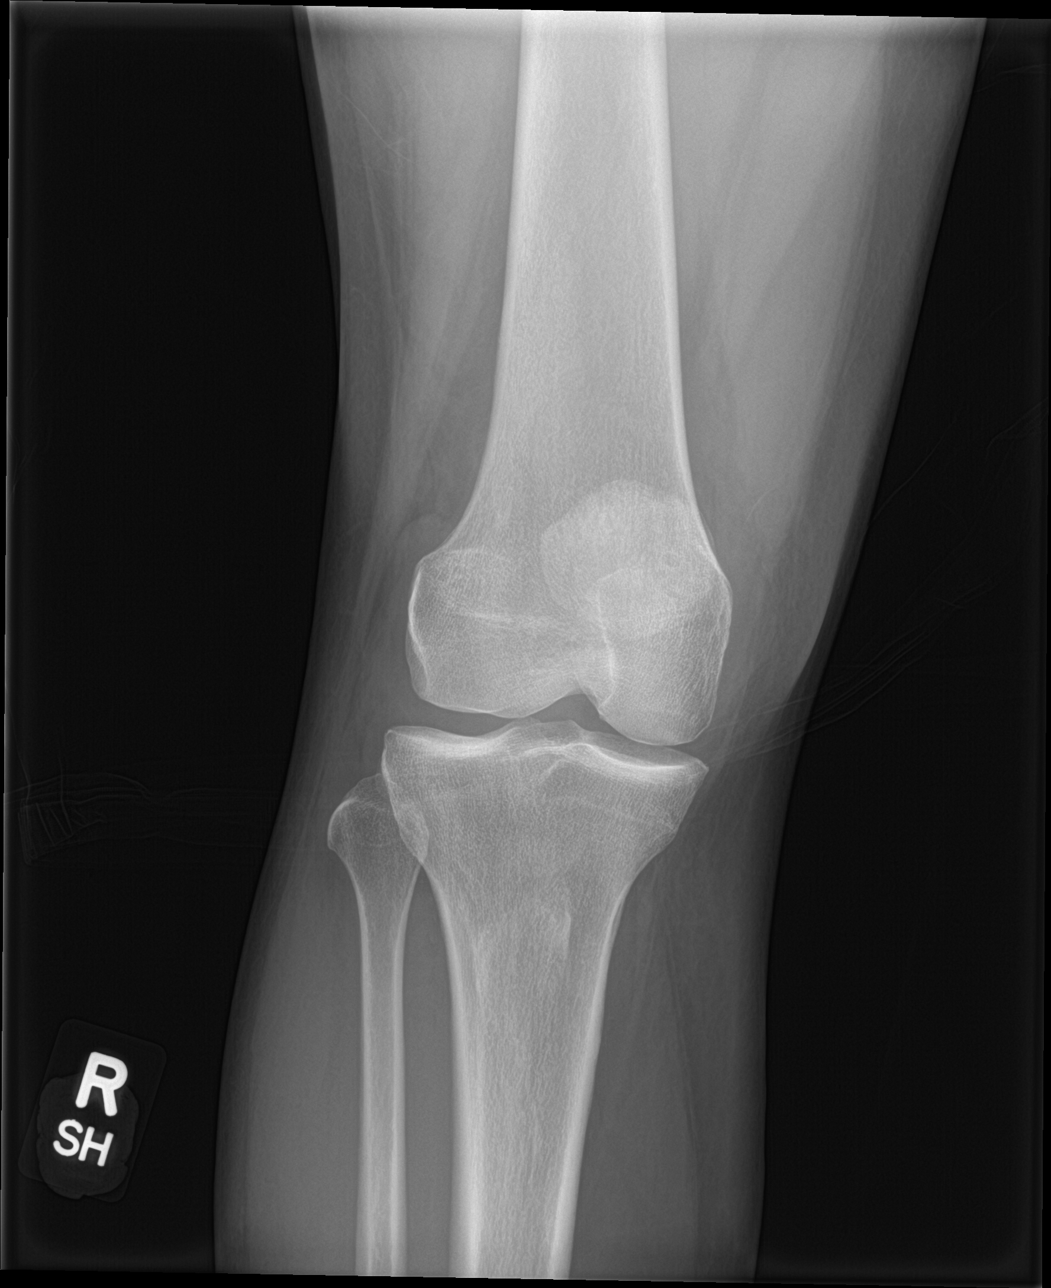

[knee lat]
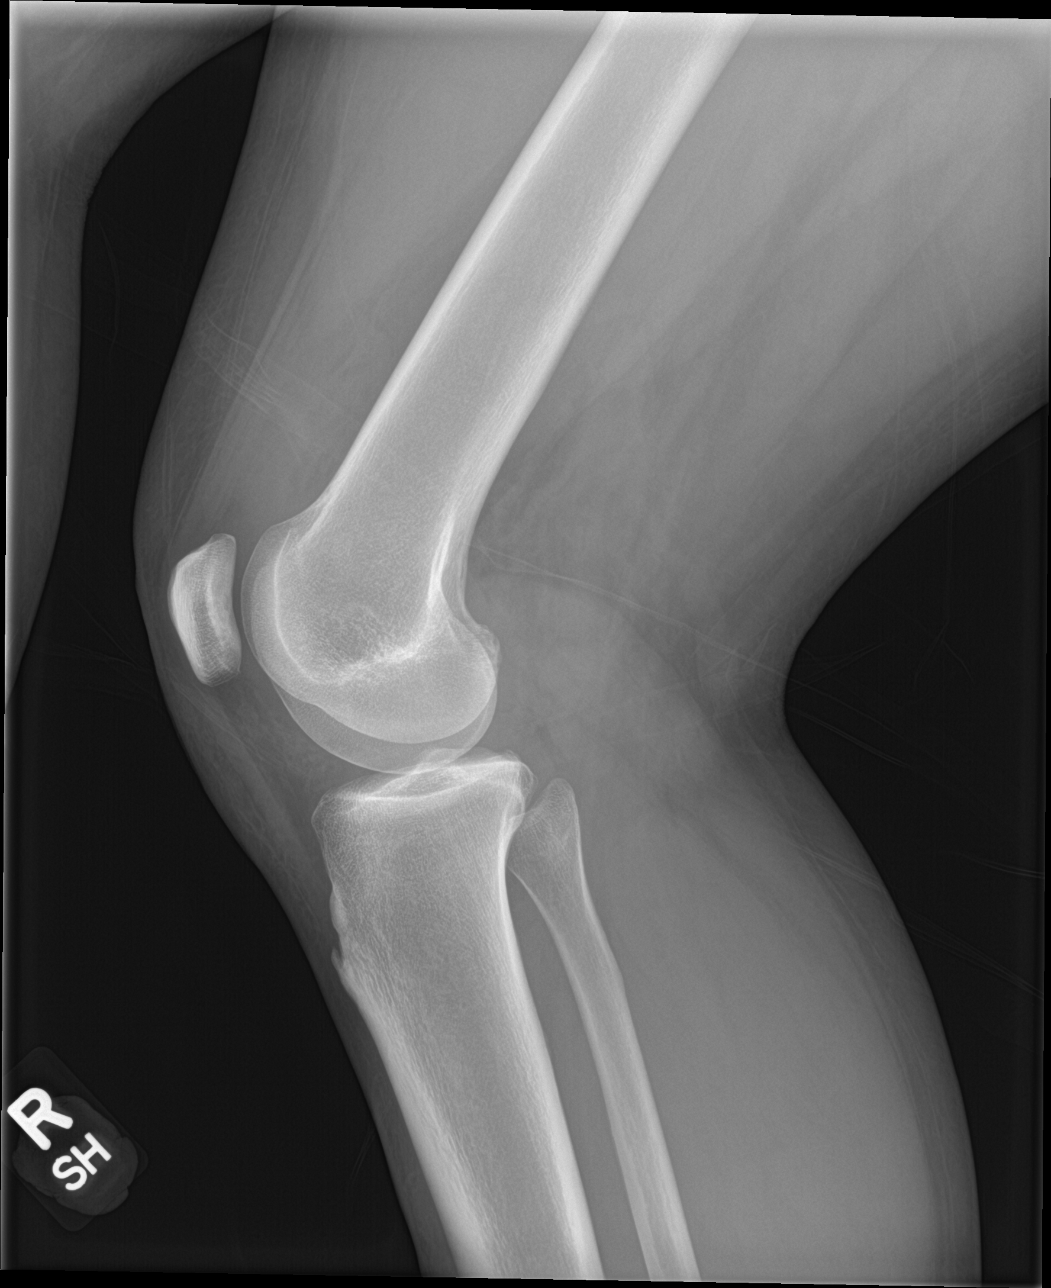

[knee obl (1 of 2)]
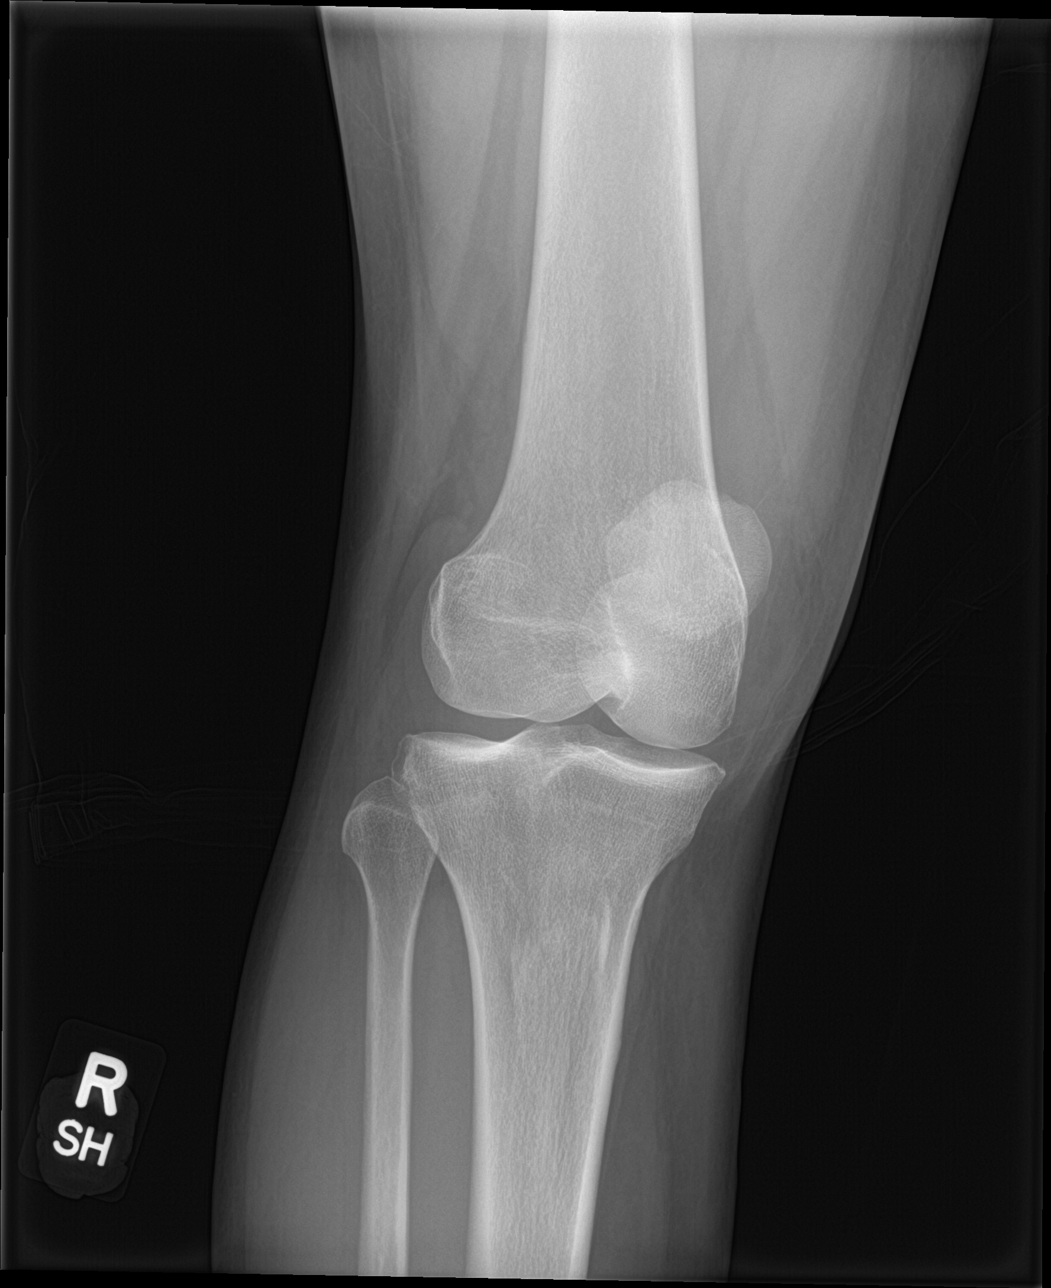

[knee obl (2 of 2)]
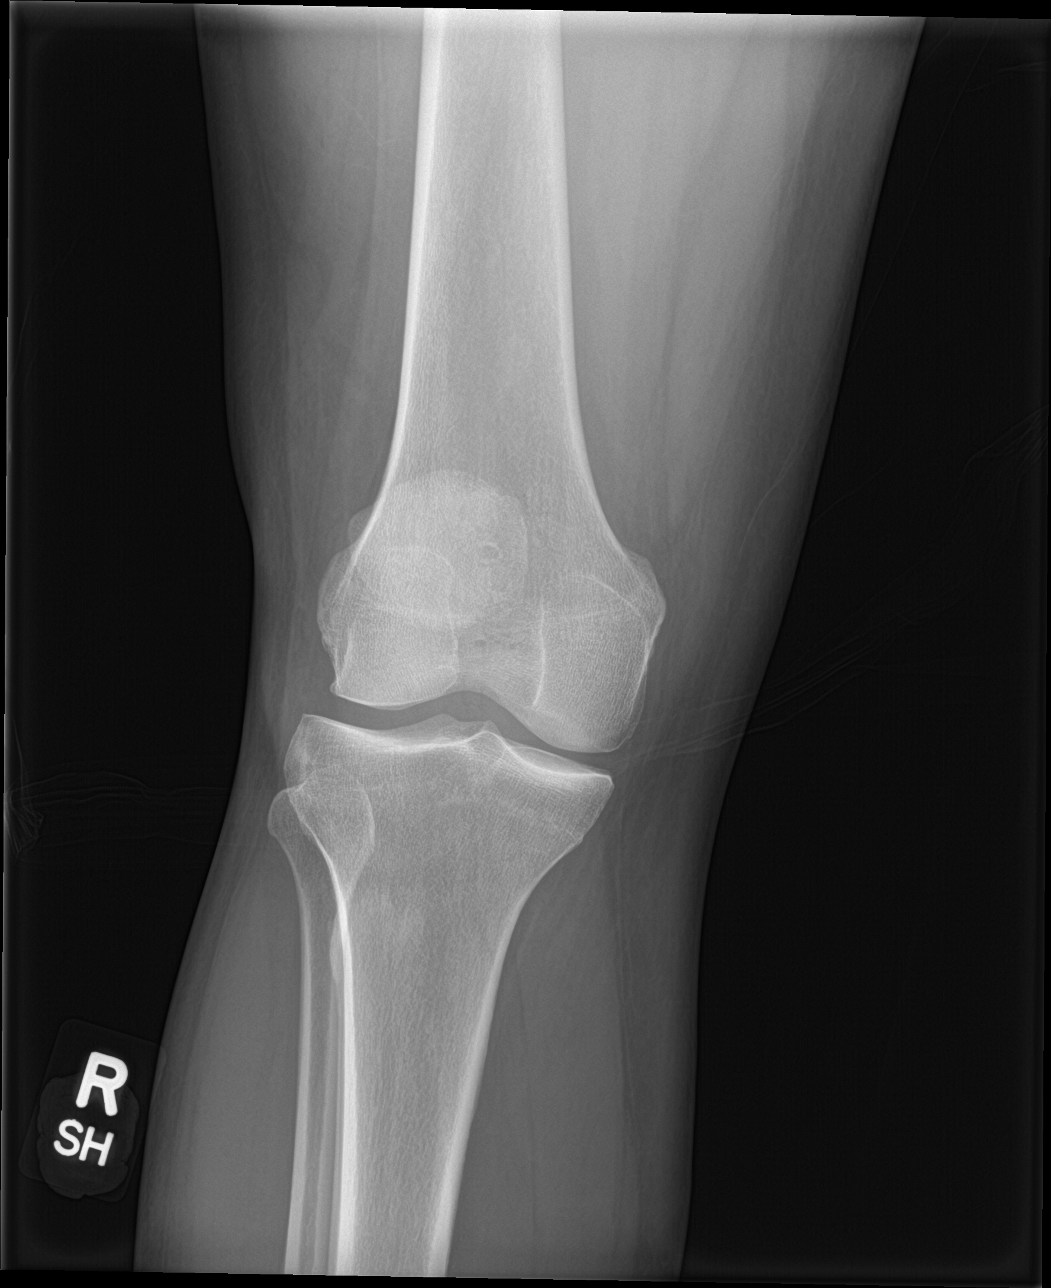

[4 of 4 positions shown; findings below may reference images not displayed]

FINDINGS: No evidence of fracture, dislocation, or joint effusion. No evidence
of arthropathy or other focal bone abnormality. Soft tissues are
unremarkable.
IMPRESSION: Negative.

## 2022-02-08 IMAGING — DX DG KNEE COMPLETE 4+V*L*
4 series · 4 of 4 positions shown · non-contrast
Comparison: October 15, 2015.

CLINICAL DATA: Bilateral knee pain for several months.

EXAM:
LEFT KNEE - COMPLETE 4+ VIEW

[knee ap]
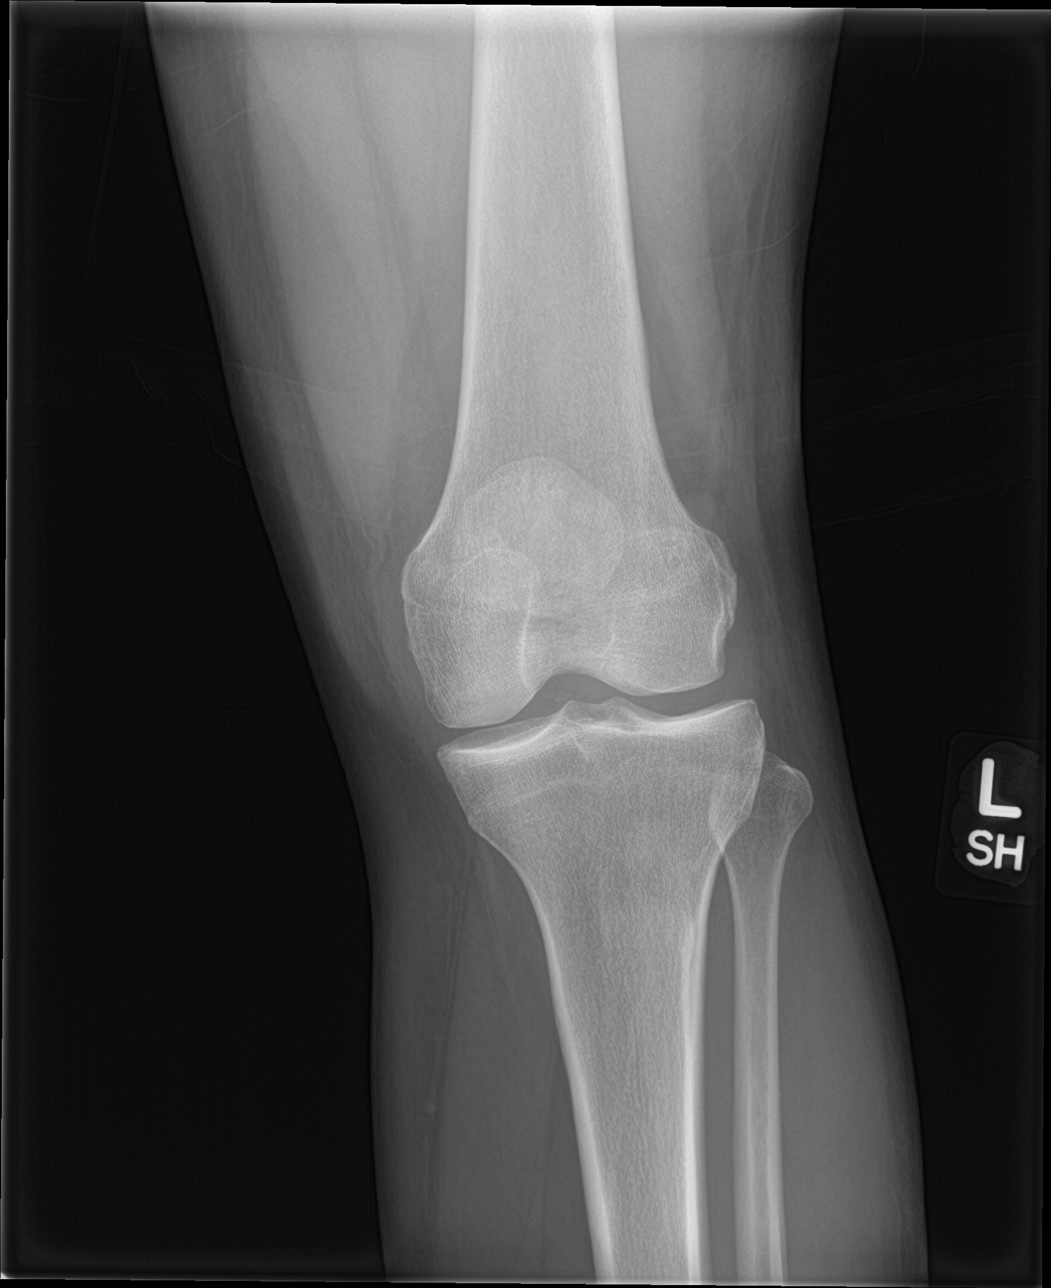

[knee lat]
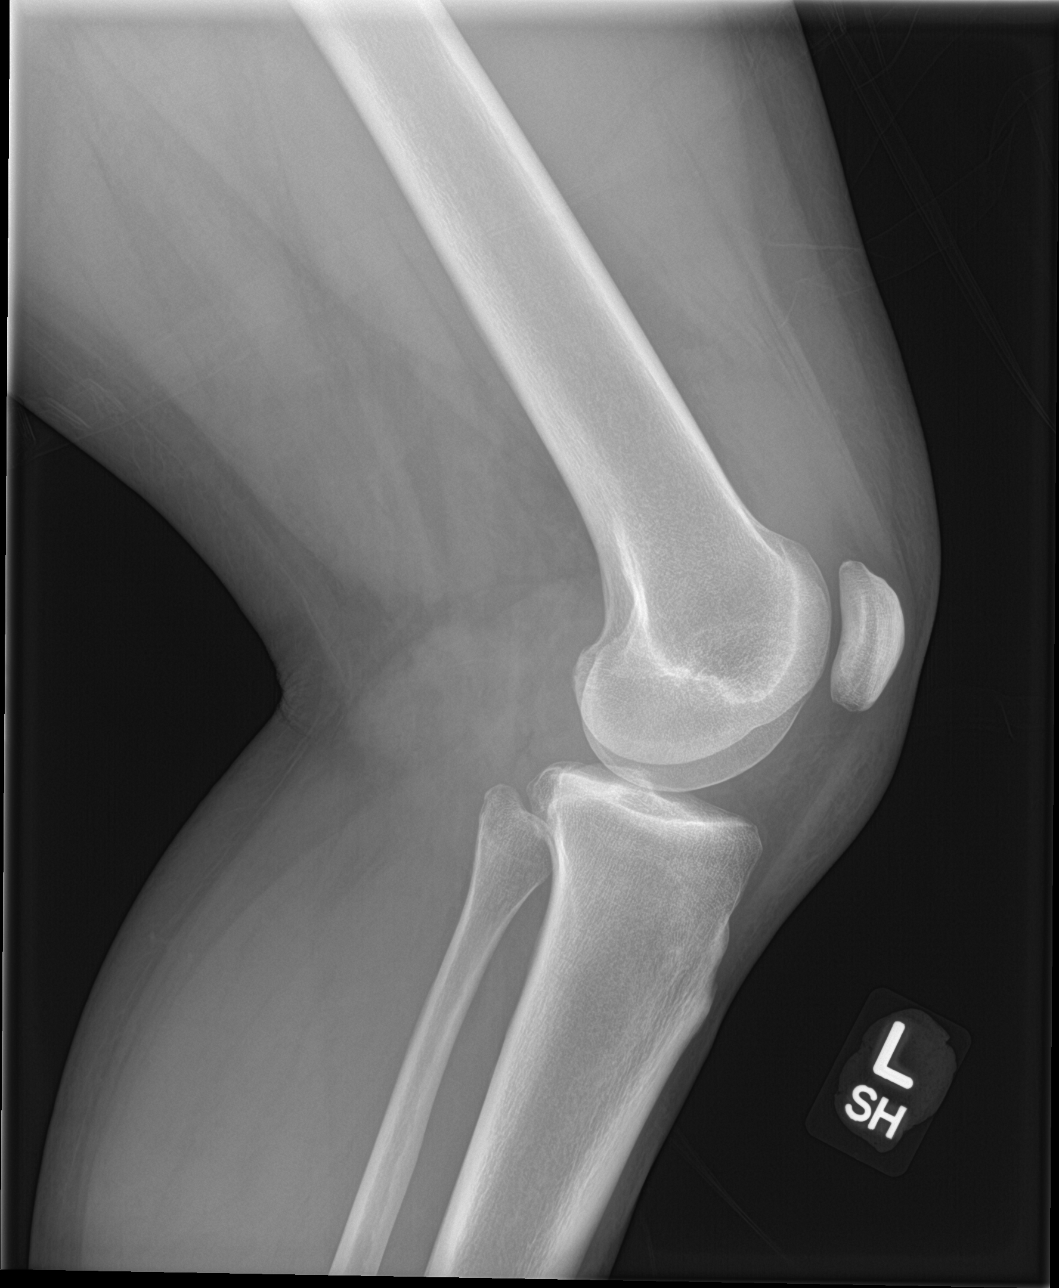

[knee obl (1 of 2)]
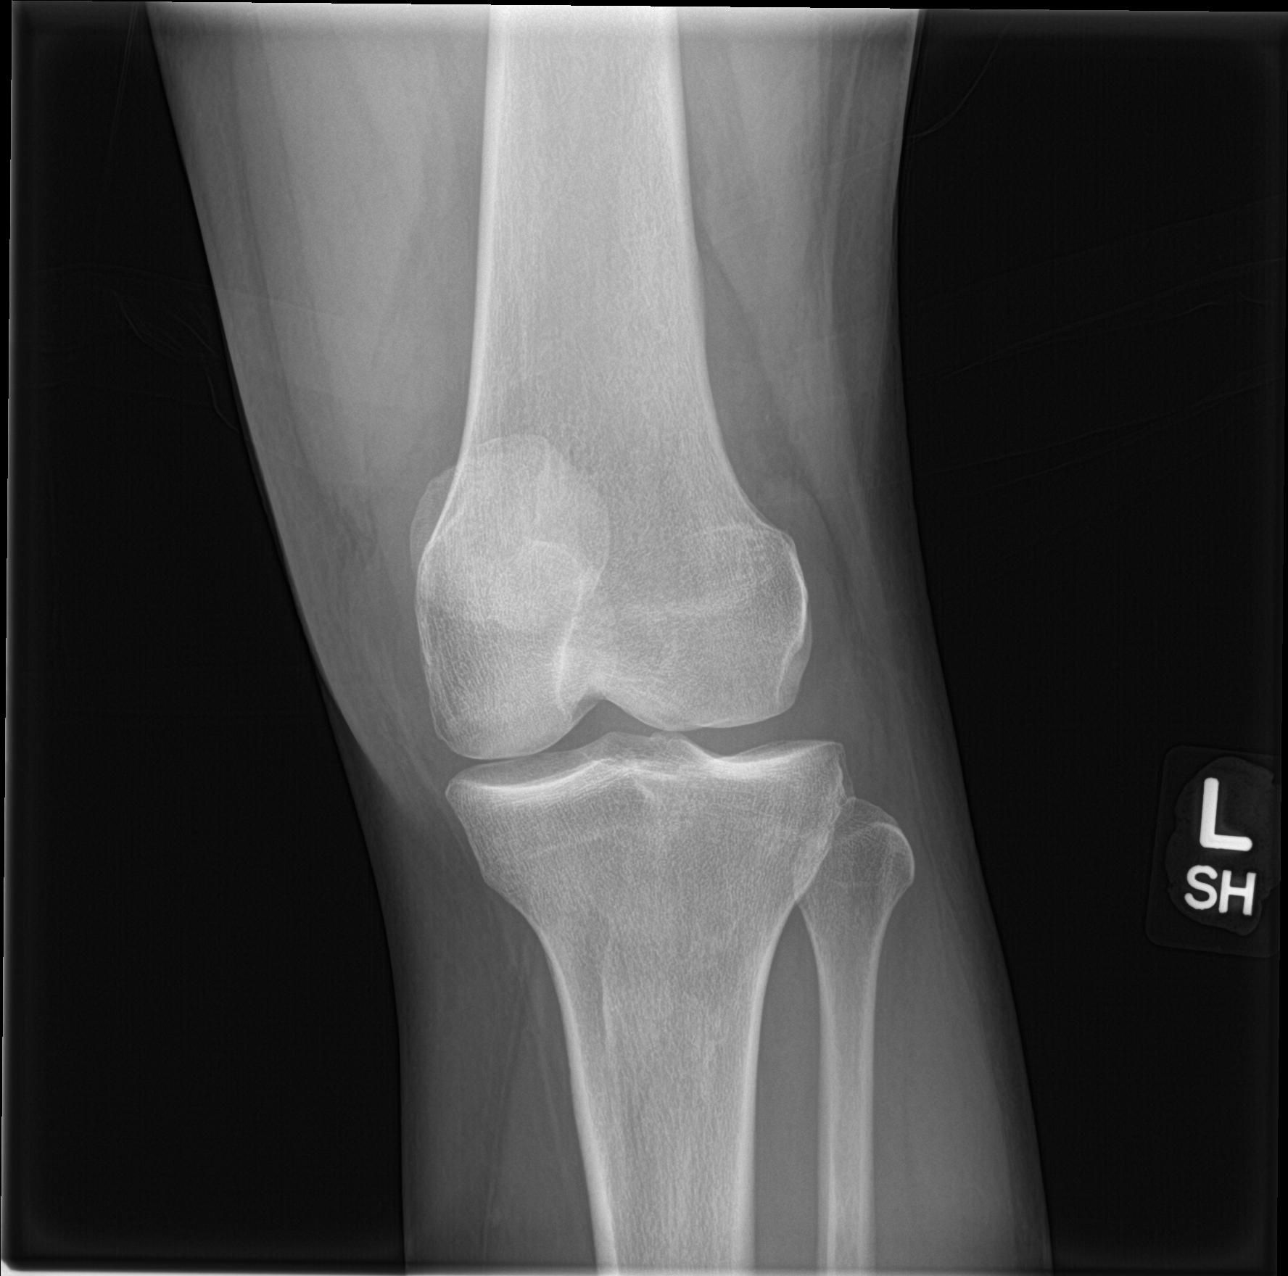

[knee obl (2 of 2)]
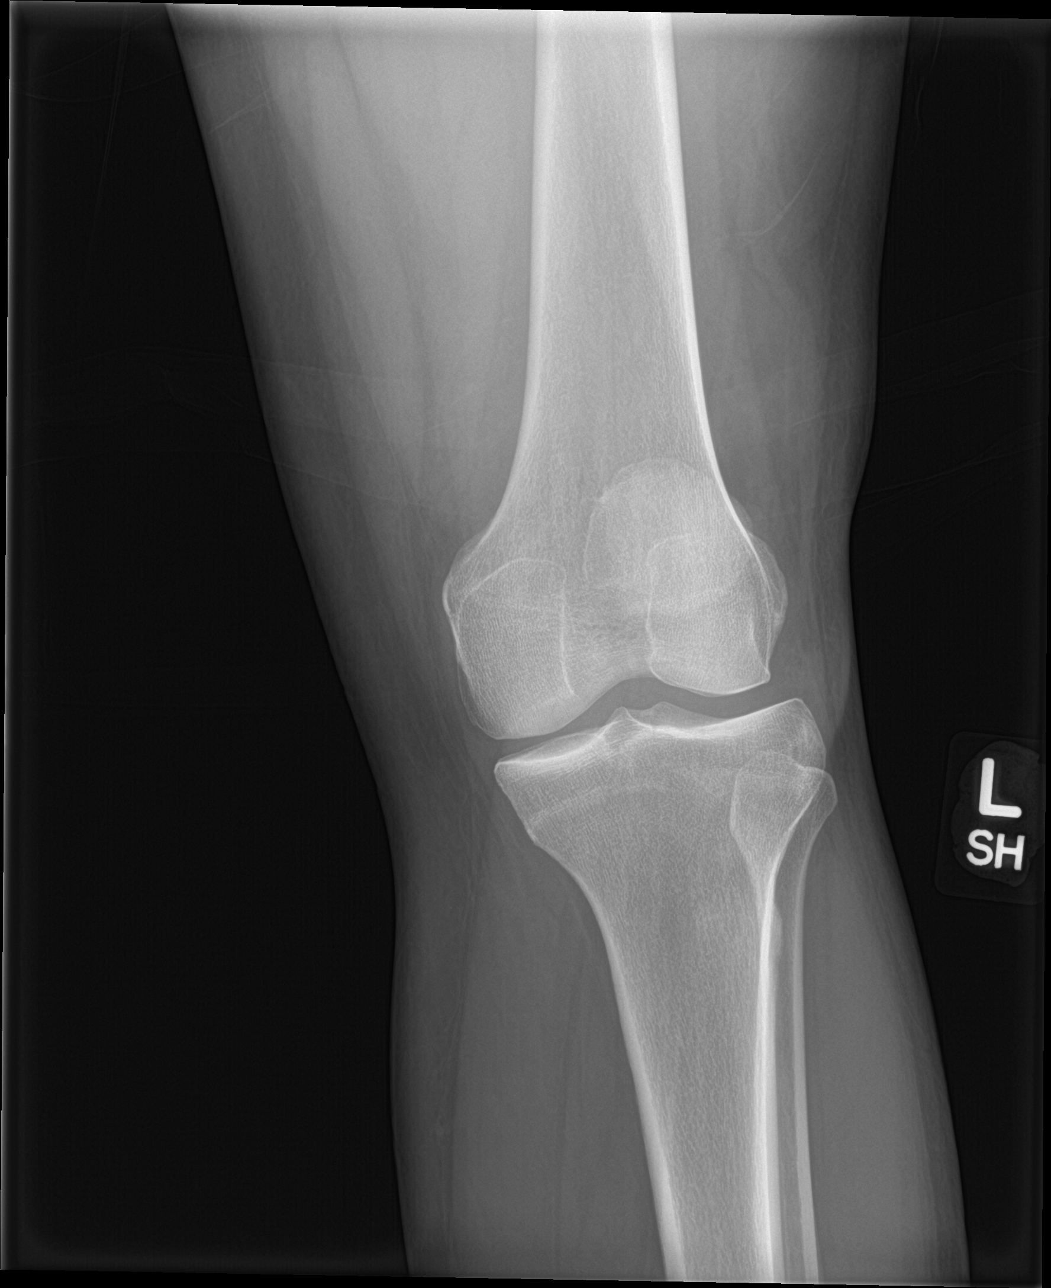

[4 of 4 positions shown; findings below may reference images not displayed]

FINDINGS: No evidence of fracture or dislocation. Mild suprapatellar joint
effusion is noted. No evidence of arthropathy or other focal bone
abnormality. Soft tissues are unremarkable.
IMPRESSION: Mild suprapatellar joint effusion. No other abnormality seen in the
left knee.

## 2022-02-25 ENCOUNTER — Ambulatory Visit (INDEPENDENT_AMBULATORY_CARE_PROVIDER_SITE_OTHER): Payer: 59 | Admitting: Family

## 2022-02-25 ENCOUNTER — Other Ambulatory Visit: Payer: Self-pay | Admitting: Family

## 2022-02-25 ENCOUNTER — Encounter: Payer: Self-pay | Admitting: Family

## 2022-02-25 ENCOUNTER — Other Ambulatory Visit (HOSPITAL_BASED_OUTPATIENT_CLINIC_OR_DEPARTMENT_OTHER): Payer: Self-pay

## 2022-02-25 VITALS — BP 118/64 | HR 99 | Temp 98.2°F | Ht 62.0 in | Wt 174.0 lb

## 2022-02-25 DIAGNOSIS — J029 Acute pharyngitis, unspecified: Secondary | ICD-10-CM | POA: Diagnosis not present

## 2022-02-25 DIAGNOSIS — J020 Streptococcal pharyngitis: Secondary | ICD-10-CM | POA: Diagnosis not present

## 2022-02-25 LAB — POCT RAPID STREP A (OFFICE): Rapid Strep A Screen: POSITIVE — AB

## 2022-02-25 MED ORDER — LIDOCAINE VISCOUS HCL 2 % MT SOLN
15.0000 mL | Freq: Four times a day (QID) | OROMUCOSAL | 0 refills | Status: DC | PRN
  Filled 2022-02-25: qty 100, 2d supply, fill #0

## 2022-02-25 MED ORDER — FLUCONAZOLE 150 MG PO TABS
ORAL_TABLET | ORAL | 0 refills | Status: DC
  Filled 2022-02-25: qty 2, 3d supply, fill #0

## 2022-02-25 MED ORDER — AMOXICILLIN-POT CLAVULANATE 875-125 MG PO TABS
1.0000 | ORAL_TABLET | Freq: Two times a day (BID) | ORAL | 0 refills | Status: AC
  Filled 2022-02-25: qty 20, 10d supply, fill #0

## 2022-02-25 NOTE — Progress Notes (Signed)
Christina Valenzuela is a 46 y.o. 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 20 tablet 0   fluconazole (DIFLUCAN) 150 MG tablet Take 1 tablet by mouth as directed; repeat after 72 hours. 2 tablet 0   gabapentin (NEURONTIN) 300 MG capsule Take 1 capsule (300 mg total) by mouth 3 (three) times daily. 60 capsule 1   lidocaine (XYLOCAINE) 2 % solution Use as directed 15 mLs in the mouth or throat every 6 (six) hours as needed for mouth pain. Gargle and spit 100 mL 0   metroNIDAZOLE (METROGEL) 0.75 % vaginal gel Place 1 Applicatorful vaginally at bedtime. Apply one applicatorful to vagina at bedtime for 5 days 70 g 1   No current facility-administered medications for this visit.    Allergies: Hydrocodone-acetaminophen and Latex  Past Medical History:  Diagnosis Date   Depression     Past Surgical History:  Procedure Laterality Date   ABDOMINAL HYSTERECTOMY  11/28/2016   [facility] - Dr Zigmund Daniel.  UTERINE LEIOMYOMA w/ MENORRHAGIA   CESAREAN SECTION     CESAREAN SECTION     CHOLECYSTECTOMY N/A 01/05/2017   Procedure:  LAPAROSCOPIC CHOLECYSTECTOMY WITH INTRAOPERATIVE CHOLANGIOGRAM;  Surgeon: Michael Boston, MD;  Location: [facility];  Service: General;  Laterality: N/A;   EPIGASTRIC HERNIA REPAIR     with mesh.  Dr Excell Seltzer   TUBAL LIGATION      Family History  Problem Relation Age of Onset   Diabetes Maternal Grandfather     Social History   Tobacco Use   Smoking status: Every Day    Packs/day: 0.25    Types: Cigarettes   Smokeless tobacco: Never  Substance Use Topics   Alcohol use: Not Currently    Subjective:   Sore throat x 2 days; no fever; no sick contacts; "feels swollen."     Objective:  Vitals:   02/25/22 0829  BP: 118/64  Pulse: 99  Temp: 98.2 F (36.8 C)  TempSrc: Oral  SpO2: 99%  Weight: 174 lb (78.9 kg)  Height: '5\' 2"'$  (1.575 m)    General: Well developed, well nourished, in no acute distress  Skin : Warm and dry.  Head: Normocephalic and atraumatic  Eyes: Sclera and conjunctiva clear; pupils round and reactive to light; extraocular movements intact  Ears: External normal; canals clear; tympanic membranes normal  Oropharynx: Pink, supple. + swollen, erythematous tonsils with exudate;  Neck: Supple without thyromegaly, adenopathy  Lungs: Respirations unlabored; clear to auscultation bilaterally without wheeze, rales, rhonchi  Neurologic: Alert and oriented; speech intact; face symmetrical; moves all extremities well; CNII-XII intact without focal deficit   Assessment:  1. Acute sore throat   2.  Strep throat     Plan:  + strep throat; Rx for Augmentin 875 mg bid x 10 days; Rx for Viscous Lidocaine- use as directed for throat pain; work note given as requested; follow up worse, no better.     No follow-ups on file.  No orders of the defined types were placed in this encounter.   Requested Prescriptions   Signed Prescriptions Disp Refills   amoxicillin-clavulanate (AUGMENTIN) 875-125 MG tablet 20 tablet 0    Sig: Take 1 tablet by mouth 2 (two) times daily for 10  days.   lidocaine (XYLOCAINE) 2 % solution 100 mL 0    Sig: Use as directed 15 mLs in the mouth or throat every 6 (six) hours as needed for mouth pain. Gargle and spit   fluconazole (DIFLUCAN) 150 MG tablet 2 tablet 0    Sig: Take 1 tablet by mouth as directed; repeat after 72 hours.

## 2022-02-25 NOTE — Progress Notes (Signed)
lw 

## 2022-02-25 NOTE — Addendum Note (Signed)
Addended by: Kittie Plater, Alayah Knouff HUA on: 02/25/2022 09:00 AM   Modules accepted: Orders

## 2022-03-11 ENCOUNTER — Other Ambulatory Visit: Payer: Self-pay | Admitting: Family

## 2022-03-11 ENCOUNTER — Telehealth: Payer: Self-pay

## 2022-03-11 ENCOUNTER — Ambulatory Visit: Payer: 59 | Admitting: Family

## 2022-03-11 ENCOUNTER — Other Ambulatory Visit (HOSPITAL_BASED_OUTPATIENT_CLINIC_OR_DEPARTMENT_OTHER): Payer: Self-pay

## 2022-03-11 ENCOUNTER — Encounter: Payer: Self-pay | Admitting: Family

## 2022-03-11 VITALS — BP 118/68 | HR 93 | Temp 98.7°F | Ht 62.0 in | Wt 174.4 lb

## 2022-03-11 DIAGNOSIS — G43809 Other migraine, not intractable, without status migrainosus: Secondary | ICD-10-CM | POA: Diagnosis not present

## 2022-03-11 DIAGNOSIS — Z720 Tobacco use: Secondary | ICD-10-CM

## 2022-03-11 MED ORDER — VARENICLINE TARTRATE (STARTER) 0.5 MG X 11 & 1 MG X 42 PO TBPK
ORAL_TABLET | ORAL | 0 refills | Status: DC
  Filled 2022-03-11: qty 53, 28d supply, fill #0

## 2022-03-11 MED ORDER — UBRELVY 100 MG PO TABS
ORAL_TABLET | ORAL | 0 refills | Status: DC
  Filled 2022-03-11: qty 10, 15d supply, fill #0

## 2022-03-11 NOTE — Telephone Encounter (Signed)
I have called the pt and relayed the message from the provider. Since the Christina Valenzuela is not covered then she just wants to try the alterative if that is okay. She feels like the migraine may still be lingering and wants to have something on hand for if and when it comes back. I have informed her that I will relay the message to the provider.

## 2022-03-11 NOTE — Telephone Encounter (Signed)
Christina Valenzuela not covered by insurance, requires failure of 2 triptans and also being treated w/ one of the following:  currently being treated with ONE of the following: (1) Elavil (amitriptyline) or Effexor (venlafaxine) unless there is a contraindication or intolerance to these medications, (2) Depakote/Depakote ER (divalproex sodium) or Topamax (topiramate) unless there is a contraindication or intolerance to these medications, (3) A beta-blocker (in other words: atenolol, propranolol, nadolol, timolol, or metoprolol) unless there is a contraindication or intolerance to these medications, (4) Atacand (candesartan) unless there is a contraindication or intolerance to this medication, (5) Generic lisinopril unless there is a contraindication or intolerance to this medication

## 2022-03-11 NOTE — Patient Instructions (Signed)
Migraine Headache  A migraine headache is a very strong throbbing pain on one side or both sides of your head. This type of headache can also cause other symptoms. It can last from 4 hours to 3 days. Talk with your doctor about what things may bring on (trigger) this condition.  What are the causes?  The exact cause of this condition is not known. This condition may be triggered or caused by:  Drinking alcohol.  Smoking.  Taking medicines, such as:  Medicine used to treat chest pain (nitroglycerin).  Birth control pills.  Estrogen.  Some blood pressure medicines.  Eating or drinking certain products.  Doing physical activity.  Other things that may trigger a migraine headache include:  Having a menstrual period.  Pregnancy.  Hunger.  Stress.  Not getting enough sleep or getting too much sleep.  Weather changes.  Tiredness (fatigue).  What increases the risk?  Being 25-55 years old.  Being female.  Having a family history of migraine headaches.  Being Caucasian.  Having depression or anxiety.  Being very overweight.  What are the signs or symptoms?  A throbbing pain. This pain may:  Happen in any area of the head, such as on one side or both sides.  Make it hard to do daily activities.  Get worse with physical activity.  Get worse around bright lights or loud noises.  Other symptoms may include:  Feeling sick to your stomach (nauseous).  Vomiting.  Dizziness.  Being sensitive to bright lights, loud noises, or smells.  Before you get a migraine headache, you may get warning signs (an aura). An aura may include:  Seeing flashing lights or having blind spots.  Seeing bright spots, halos, or zigzag lines.  Having tunnel vision or blurred vision.  Having numbness or a tingling feeling.  Having trouble talking.  Having weak muscles.  Some people have symptoms after a migraine headache (postdromal phase), such as:  Tiredness.  Trouble thinking (concentrating).  How is this treated?  Taking medicines that:  Relieve  pain.  Relieve the feeling of being sick to your stomach.  Prevent migraine headaches.  Treatment may also include:  Having acupuncture.  Avoiding foods that bring on migraine headaches.  Learning ways to control your body functions (biofeedback).  Therapy to help you know and deal with negative thoughts (cognitive behavioral therapy).  Follow these instructions at home:  Medicines  Take over-the-counter and prescription medicines only as told by your doctor.  Ask your doctor if the medicine prescribed to you:  Requires you to avoid driving or using heavy machinery.  Can cause trouble pooping (constipation). You may need to take these steps to prevent or treat trouble pooping:  Drink enough fluid to keep your pee (urine) pale yellow.  Take over-the-counter or prescription medicines.  Eat foods that are high in fiber. These include beans, whole grains, and fresh fruits and vegetables.  Limit foods that are high in fat and sugar. These include fried or sweet foods.  Lifestyle  Do not drink alcohol.  Do not use any products that contain nicotine or tobacco, such as cigarettes, e-cigarettes, and chewing tobacco. If you need help quitting, ask your doctor.  Get at least 8 hours of sleep every night.  Limit and deal with stress.  General instructions  Keep a journal to find out what may bring on your migraine headaches. For example, write down:  What you eat and drink.  How much sleep you get.  Any change in   what you eat or drink.  Any change in your medicines.  If you have a migraine headache:  Avoid things that make your symptoms worse, such as bright lights.  It may help to lie down in a dark, quiet room.  Do not drive or use heavy machinery.  Ask your doctor what activities are safe for you.  Keep all follow-up visits as told by your doctor. This is important.  Contact a doctor if:  You get a migraine headache that is different or worse than others you have had.  You have more than 15 headache days in one month.  Get  help right away if:  Your migraine headache gets very bad.  Your migraine headache lasts longer than 72 hours.  You have a fever.  You have a stiff neck.  You have trouble seeing.  Your muscles feel weak or like you cannot control them.  You start to lose your balance a lot.  You start to have trouble walking.  You pass out (faint).  You have a seizure.  Summary  A migraine headache is a very strong throbbing pain on one side or both sides of your head. These headaches can also cause other symptoms.  This condition may be treated with medicines and changes to your lifestyle.  Keep a journal to find out what may bring on your migraine headaches.  Contact a doctor if you get a migraine headache that is different or worse than others you have had.  Contact your doctor if you have more than 15 headache days in a month.  This information is not intended to replace advice given to you by your health care provider. Make sure you discuss any questions you have with your health care provider.  Document Revised: 10/03/2021 Document Reviewed: 06/03/2018  Elsevier Patient Education  2023 [employer] Inc[employer]

## 2022-03-11 NOTE — Progress Notes (Signed)
Christina Valenzuela is a 46 y.o. 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 60 capsule 1   Ubrogepant (UBRELVY) 100 MG TABS Take 1 tablet at onset of headache; repeat after 2 hours as needed; max 2 tablets/24 hours. 10 tablet 0   Varenicline Tartrate, Starter, (CHANTIX STARTING MONTH PAK) 0.5 MG X 11 & 1 MG X 42 TBPK Take as directed to quit smoking 53 each 0   No current facility-administered medications for this visit.    Allergies: Hydrocodone-acetaminophen and Latex  Past Medical History:  Diagnosis Date   Depression     Past Surgical History:  Procedure Laterality Date   ABDOMINAL HYSTERECTOMY  11/28/2016   [facility] - Dr Zigmund Daniel.  UTERINE LEIOMYOMA w/ MENORRHAGIA   CESAREAN SECTION     CESAREAN SECTION     CHOLECYSTECTOMY N/A 01/05/2017   Procedure: LAPAROSCOPIC CHOLECYSTECTOMY WITH INTRAOPERATIVE CHOLANGIOGRAM;  Surgeon: Michael Boston, MD;  Location: [facility];  Service: General;  Laterality: N/A;   EPIGASTRIC HERNIA REPAIR     with mesh.  Dr Excell Seltzer   TUBAL LIGATION      Family History  Problem Relation Age of  Onset   Diabetes Maternal Grandfather     Social History   Tobacco Use   Smoking status: Every Day    Packs/day: 0.25    Types: Cigarettes   Smokeless tobacco: Never  Substance Use Topics   Alcohol use: Not Currently    Subjective:   History of migraine headaches; feels that headaches are getting worse- more frequent and intense; notes that this last headache started on Thursday and ended yesterday;  Notes that headache woke her up out of sleep yesterday;  [place] yesterday- "worked for about an hour and then came back."  Notes that symptoms are typically localized over left side of head;    Would also like to discuss trial of Chantix to help her quit smoking; "definitely ready and motivated to quit."   Objective:  Vitals:   03/11/22 1126  BP: 118/68  Pulse: 93  Temp: 98.7 F (37.1 C)  TempSrc: Oral  SpO2: 97%  Weight: 174 lb 6.4 oz (79.1 kg)  Height: '5\' 2"'$  (1.575 m)    General: Well developed, well nourished, in no acute distress  Skin : Warm and dry.  Head: Normocephalic and atraumatic  Eyes: Sclera and conjunctiva clear; pupils round and reactive to light; extraocular movements intact  Ears: External normal; canals clear; tympanic membranes normal  Oropharynx: Pink, supple. No suspicious lesions  Neck: Supple without thyromegaly, adenopathy  Lungs: Respirations unlabored;  Neurologic: Alert and oriented; speech intact; face symmetrical; moves all extremities well; CNII-XII intact  without focal deficit   Assessment:  1. Other migraine without status migrainosus, not intractable   2. Tobacco abuse     Plan:  Encouraged to start keeping headache journal; trial of Roselyn Meier- use as directed; follow up in 3 months; Congratulated patient on commitment to quit smoking; Rx for Chantix- use as directed;  Return in about 3 months (around 06/11/2022) for fasting CPE.  No orders of the defined types were placed in this encounter.   Requested Prescriptions    Signed Prescriptions Disp Refills   Ubrogepant (UBRELVY) 100 MG TABS 10 tablet 0    Sig: Take 1 tablet at onset of headache; repeat after 2 hours as needed; max 2 tablets/24 hours.   Varenicline Tartrate, Starter, (CHANTIX STARTING MONTH PAK) 0.5 MG X 11 & 1 MG X 42 TBPK 53 each 0    Sig: Take as directed to quit smoking

## 2022-03-12 ENCOUNTER — Other Ambulatory Visit (HOSPITAL_BASED_OUTPATIENT_CLINIC_OR_DEPARTMENT_OTHER): Payer: Self-pay

## 2022-03-13 ENCOUNTER — Other Ambulatory Visit (HOSPITAL_BASED_OUTPATIENT_CLINIC_OR_DEPARTMENT_OTHER): Payer: Self-pay

## 2022-03-13 ENCOUNTER — Other Ambulatory Visit: Payer: Self-pay | Admitting: Family

## 2022-03-13 MED ORDER — SUMATRIPTAN SUCCINATE 100 MG PO TABS
100.0000 mg | ORAL_TABLET | ORAL | 0 refills | Status: DC | PRN
  Filled 2022-03-13: qty 9, 30d supply, fill #0
  Filled 2022-03-21: qty 10, 1d supply, fill #0

## 2022-03-13 NOTE — Telephone Encounter (Signed)
Noted  

## 2022-03-21 ENCOUNTER — Other Ambulatory Visit (HOSPITAL_BASED_OUTPATIENT_CLINIC_OR_DEPARTMENT_OTHER): Payer: Self-pay

## 2022-03-21 ENCOUNTER — Encounter: Payer: Self-pay | Admitting: Family

## 2022-03-21 MED ORDER — SUMATRIPTAN SUCCINATE 100 MG PO TABS
100.0000 mg | ORAL_TABLET | ORAL | 0 refills | Status: DC | PRN
  Filled 2022-03-21: qty 9, 30d supply, fill #0
  Filled 2022-07-25: qty 9, 30d supply, fill #1

## 2022-04-21 ENCOUNTER — Ambulatory Visit: Payer: 59

## 2022-04-21 ENCOUNTER — Other Ambulatory Visit (HOSPITAL_COMMUNITY)
Admission: RE | Admit: 2022-04-21 | Discharge: 2022-04-21 | Disposition: A | Discharge status: Home / Self Care | Payer: 59 | Source: Ambulatory Visit | Attending: Obstetrics and Gynecology | Admitting: Obstetrics and Gynecology

## 2022-04-21 VITALS — BP 126/83 | HR 87 | Wt 175.0 lb

## 2022-04-21 DIAGNOSIS — N898 Other specified noninflammatory disorders of vagina: Secondary | ICD-10-CM

## 2022-04-21 DIAGNOSIS — Z113 Encounter for screening for infections with a predominantly sexual mode of transmission: Secondary | ICD-10-CM | POA: Diagnosis present

## 2022-04-22 ENCOUNTER — Encounter: Payer: Self-pay | Admitting: Family Medicine

## 2022-04-22 LAB — HEPATITIS B SURFACE ANTIGEN: Hepatitis B Surface Ag: NEGATIVE

## 2022-04-22 LAB — RPR: RPR Ser Ql: NONREACTIVE

## 2022-04-22 LAB — HIV ANTIBODY (ROUTINE TESTING W REFLEX): HIV Screen 4th Generation wRfx: NONREACTIVE

## 2022-04-22 LAB — HEPATITIS C ANTIBODY: Hep C Virus Ab: NONREACTIVE

## 2022-04-23 LAB — CERVICOVAGINAL ANCILLARY ONLY
Bacterial Vaginitis (gardnerella): POSITIVE — AB
Candida Glabrata: NEGATIVE
Candida Vaginitis: NEGATIVE
Chlamydia: NEGATIVE
Comment: NEGATIVE
Comment: NEGATIVE
Comment: NEGATIVE
Comment: NEGATIVE
Comment: NEGATIVE
Comment: NORMAL
Neisseria Gonorrhea: NEGATIVE
Trichomonas: NEGATIVE

## 2022-05-20 ENCOUNTER — Other Ambulatory Visit (HOSPITAL_BASED_OUTPATIENT_CLINIC_OR_DEPARTMENT_OTHER): Payer: Self-pay

## 2022-05-20 ENCOUNTER — Other Ambulatory Visit: Payer: Self-pay | Admitting: Family

## 2022-05-20 ENCOUNTER — Encounter: Payer: Self-pay | Admitting: Family

## 2022-05-20 MED ORDER — VARENICLINE TARTRATE (STARTER) 0.5 MG X 11 & 1 MG X 42 PO TBPK
ORAL_TABLET | ORAL | 0 refills | Status: DC
  Filled 2022-05-20: qty 53, 28d supply, fill #0

## 2022-05-27 ENCOUNTER — Telehealth: Payer: 59

## 2022-05-27 ENCOUNTER — Encounter: Payer: Self-pay | Admitting: Family

## 2022-05-27 ENCOUNTER — Telehealth: Payer: 59 | Admitting: Physician Assistant

## 2022-05-27 ENCOUNTER — Other Ambulatory Visit (HOSPITAL_BASED_OUTPATIENT_CLINIC_OR_DEPARTMENT_OTHER): Payer: Self-pay

## 2022-05-27 DIAGNOSIS — J019 Acute sinusitis, unspecified: Secondary | ICD-10-CM

## 2022-05-27 DIAGNOSIS — B9789 Other viral agents as the cause of diseases classified elsewhere: Secondary | ICD-10-CM | POA: Diagnosis not present

## 2022-05-27 MED ORDER — FLUTICASONE PROPIONATE 50 MCG/ACT NA SUSP
2.0000 | Freq: Every day | NASAL | 0 refills | Status: DC
  Filled 2022-05-27: qty 16, 30d supply, fill #0

## 2022-05-27 MED ORDER — PREDNISONE 20 MG PO TABS
40.0000 mg | ORAL_TABLET | Freq: Every day | ORAL | 0 refills | Status: DC
  Filled 2022-05-27: qty 6, 3d supply, fill #0

## 2022-05-27 NOTE — Patient Instructions (Signed)
Christina Valenzuela, thank you for joining Leeanne Rio, PA-C for today's virtual visit.  While this provider is not your primary care provider (PCP), if your PCP is located in our provider database this encounter information will be shared with them immediately following your visit.   [facility] account gives you access to today's visit and all your visits, tests, and labs performed at [facility] " click here if you don't have a [facility] account or go to [email or web address]  Consent: (Patient) Christina Valenzuela provided verbal consent for this virtual visit at the beginning of the encounter.  Current Medications:  Current Outpatient Medications:    gabapentin (NEURONTIN) 300 MG capsule, Take 1 capsule (300 mg total) by mouth 3 (three) times daily. (Patient not taking: Reported on 04/21/2022), Disp: 60 capsule, Rfl: 1   SUMAtriptan (IMITREX) 100 MG tablet, Take 1 tablet (100 mg total) by mouth every 2 (two) hours as needed for migraine. May repeat in 2 hours if headache persists or recurs. (Patient not taking: Reported on 04/21/2022), Disp: 10 tablet, Rfl: 0   Varenicline Tartrate, Starter, (CHANTIX STARTING MONTH PAK) 0.5 MG X 11 & 1 MG X 42 TBPK, Take as directed to quit smoking, Disp: 53 each, Rfl: 0   Medications ordered in this encounter:  No orders of the defined types were placed in this encounter.    *If you need refills on other medications prior to your next appointment, please contact your pharmacy*  Follow-Up: Call back or seek an in-person evaluation if the symptoms worsen or if the condition fails to improve as anticipated.  [place] [phone]  Other Instructions E-Visit for Sinus Problems  We are sorry that you are not feeling well.  Here is how we plan to help!  Please COVID test to be cautious as discussed.  Based on what you have shared with me it looks like you have sinusitis.  Sinusitis is  inflammation and infection in the sinus cavities of the head.  Based on your presentation I believe you most likely have Acute Viral Sinusitis.This is an infection most likely caused by a virus. There is not specific treatment for viral sinusitis other than to help you with the symptoms until the infection runs its course.  You may use an oral decongestant such as Mucinex D or if you have glaucoma or high blood pressure use plain Mucinex. Saline nasal spray help and can safely be used as often as needed for congestion, I have prescribed: Fluticasone nasal spray two sprays in each nostril once a day  I have also prescribed a short course of prednisone to take as directed.  Some authorities believe that zinc sprays or the use of Echinacea may shorten the course of your symptoms.  Sinus infections are not as easily transmitted as other respiratory infection, however we still recommend that you avoid close contact with loved ones, especially the very young and elderly.  Remember to wash your hands thoroughly throughout the day as this is the number one way to prevent the spread of infection!  Home Care: Only take medications as instructed by your medical team. Do not take these medications with alcohol. A steam or ultrasonic humidifier can help congestion.  You can place a towel over your head and breathe in the steam from hot water coming from a faucet. Avoid close contacts especially the very young and the elderly. Cover your mouth when you cough or sneeze. Always remember to  wash your hands.  Get Help Right Away If: You develop worsening fever or sinus pain. You develop a severe head ache or visual changes. Your symptoms persist after you have completed your treatment plan.  Make sure you Understand these instructions. Will watch your condition. Will get help right away if you are not doing well or get worse.    If you have been instructed to have an in-person evaluation today at a local  Urgent Care facility, please use the link below. It will take you to a list of all of our available [facility], including address, phone number and hours of operation. Please do not delay care.  [facility]  If you or a family member do not have a primary care provider, use the link below to schedule a visit and establish care. When you choose a [facility] primary care physician or advanced practice provider, you gain a long-term partner in health. Find a Primary Care Provider  Learn more about [facility]'s in-office and virtual care options: [facility]

## 2022-05-27 NOTE — Progress Notes (Signed)
Virtual Visit Consent   Christina Valenzuela, you are scheduled for a virtual visit with a [facility] provider today. Just as with appointments in the office, your consent must be obtained to participate. Your consent will be active for this visit and any virtual visit you may have with one of our providers in the next 365 days. If you have a MyChart account, a copy of this consent can be sent to you electronically.  As this is a virtual visit, video technology does not allow for your provider to perform a traditional examination. This may limit your provider's ability to fully assess your condition. If your provider identifies any concerns that need to be evaluated in person or the need to arrange testing (such as labs, EKG, etc.), we will make arrangements to do so. Although advances in technology are sophisticated, we cannot ensure that it will always work on either your end or our end. If the connection with a video visit is poor, the visit may have to be switched to a telephone visit. With either a video or telephone visit, we are not always able to ensure that we have a secure connection.  By engaging in this virtual visit, you consent to the provision of healthcare and authorize for your insurance to be billed (if applicable) for the services provided during this visit. Depending on your insurance coverage, you may receive a charge related to this service.  I need to obtain your verbal consent now. Are you willing to proceed with your visit today? Christina Valenzuela has provided verbal consent on 05/27/2022 for a virtual visit (video or telephone). Leeanne Rio, [place]  Date: 05/27/2022 8:51 AM  Virtual Visit via Video Note   I, Leeanne Rio, connected with  Christina Valenzuela  ([id number], May 23, 1975) on 05/27/22 at  8:45 AM EST by a video-enabled telemedicine application and verified that I am speaking with the correct person using two identifiers.  Location: Patient: Virtual  Visit Location Patient: Mobile Provider: Virtual Visit Location Provider: Home Office   I discussed the limitations of evaluation and management by telemedicine and the availability of in person appointments. The patient expressed understanding and agreed to proceed.    History of Present Illness: Christina Valenzuela is a 47 y.o. who identifies as a female who was assigned female at birth, and is being seen today for URI symptoms starting 2-3 days ago with rhinorrhea, nasal congestion and headache. Denies chest congestion or SOB. Nasal drainage is clear. Denies fever. Some very mild aches but has issue with her joints/knees. Has not taken   Flu and COVID in her work office.   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   Mixed incontinence 10/24/2016   Chronic anemia 09/08/2015   Pica 09/08/2015   Primary insomnia 08/22/2015   Depression with anxiety 08/22/2015   Alcohol abuse 08/22/2015   Headache 08/22/2015   UTI symptoms 02/16/2014   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 (Patient not  taking: Reported on 04/21/2022), Disp: 60 capsule, Rfl: 1   SUMAtriptan (IMITREX) 100 MG tablet, Take 1 tablet (100 mg total) by mouth every 2 (two) hours as needed for migraine. May repeat in 2 hours if headache persists or recurs. (Patient not taking: Reported on 04/21/2022), Disp: 10 tablet, Rfl: 0   Varenicline Tartrate, Starter, (CHANTIX STARTING MONTH PAK) 0.5 MG X 11 & 1 MG X 42 TBPK, Take as directed to quit smoking, Disp: 53 each, Rfl: 0  Observations/Objective: Patient is well-developed, well-nourished in no acute distress.  Resting comfortably at home.  Head is normocephalic, atraumatic.  No labored breathing. Speech is clear and coherent with logical content.  Patient is alert and oriented at baseline.   Assessment and Plan: 1. Acute viral sinusitis - fluticasone (FLONASE) 50 MCG/ACT nasal spray; Place 2 sprays into both nostrils daily.  Dispense: 16 g; Refill: 0 - predniSONE (DELTASONE) 20 MG tablet; Take 2 tablets (40 mg total) by mouth daily with breakfast.  Dispense: 6 tablet; Refill: 0  Will have her take home COVID test to be cautious. She is to let us know if positive. Otherwise will treat this as a viral sinusitis. Supportive measures and OTC medications reviewed. Flonase and prednisone per orders.   Follow Up Instructions: I discussed the assessment and treatment plan with the patient. The patient was provided an opportunity to ask questions and all were answered. The patient agreed with the plan and demonstrated an understanding of the instructions.  A copy of instructions were sent to the patient via MyChart unless otherwise noted below.   The patient was advised to call back or seek an in-person evaluation if the symptoms worsen or if the condition fails to improve as anticipated.  Time:  I spent 10 minutes with the patient via telehealth technology discussing the above problems/concerns.    Leeanne Rio, PA-C

## 2022-05-28 ENCOUNTER — Encounter: Payer: Self-pay | Admitting: Physician Assistant

## 2022-05-28 DIAGNOSIS — U071 COVID-19: Secondary | ICD-10-CM

## 2022-05-28 MED ORDER — MOLNUPIRAVIR EUA 200MG CAPSULE: 4.0000 | ORAL_CAPSULE | Freq: Two times a day (BID) | ORAL | 0 refills | Status: AC

## 2022-06-13 ENCOUNTER — Ambulatory Visit (INDEPENDENT_AMBULATORY_CARE_PROVIDER_SITE_OTHER): Payer: 59 | Admitting: Family

## 2022-06-13 ENCOUNTER — Encounter: Payer: Self-pay | Admitting: Family

## 2022-06-13 ENCOUNTER — Other Ambulatory Visit (HOSPITAL_BASED_OUTPATIENT_CLINIC_OR_DEPARTMENT_OTHER): Payer: Self-pay

## 2022-06-13 VITALS — BP 116/74 | HR 97 | Resp 18 | Ht 62.0 in | Wt 175.0 lb

## 2022-06-13 DIAGNOSIS — R0683 Snoring: Secondary | ICD-10-CM

## 2022-06-13 DIAGNOSIS — Z0000 Encounter for general adult medical examination without abnormal findings: Secondary | ICD-10-CM | POA: Diagnosis not present

## 2022-06-13 DIAGNOSIS — R635 Abnormal weight gain: Secondary | ICD-10-CM

## 2022-06-13 DIAGNOSIS — Z13220 Encounter for screening for lipoid disorders: Secondary | ICD-10-CM

## 2022-06-13 DIAGNOSIS — R928 Other abnormal and inconclusive findings on diagnostic imaging of breast: Secondary | ICD-10-CM

## 2022-06-13 DIAGNOSIS — G4719 Other hypersomnia: Secondary | ICD-10-CM | POA: Diagnosis not present

## 2022-06-13 LAB — COMPREHENSIVE METABOLIC PANEL
ALT: 15 U/L (ref 0–35)
AST: 12 U/L (ref 0–37)
Albumin: 3.9 g/dL (ref 3.5–5.2)
Alkaline Phosphatase: 56 U/L (ref 39–117)
BUN: 14 mg/dL (ref 6–23)
CO2: 27 mEq/L (ref 19–32)
Calcium: 9.1 mg/dL (ref 8.4–10.5)
Chloride: 105 mEq/L (ref 96–112)
Creatinine, Ser: 0.79 mg/dL (ref 0.40–1.20)
GFR: 89.26 mL/min (ref >60.00)
Glucose, Bld: 87 mg/dL (ref 70–99)
Potassium: 4.7 mEq/L (ref 3.5–5.1)
Sodium: 138 mEq/L (ref 135–145)
Total Bilirubin: 0.3 mg/dL (ref 0.2–1.2)
Total Protein: 6.7 g/dL (ref 6.0–8.3)

## 2022-06-13 LAB — CBC WITH DIFFERENTIAL/PLATELET
Basophils Absolute: 0.1 10*3/uL (ref 0.0–0.1)
Basophils Relative: 1 % (ref 0.0–3.0)
Eosinophils Absolute: 0.2 10*3/uL (ref 0.0–0.7)
Eosinophils Relative: 2.1 % (ref 0.0–5.0)
HCT: 40.8 % (ref 36.0–46.0)
Hemoglobin: 13 g/dL (ref 12.0–15.0)
Lymphocytes Relative: 33.1 % (ref 12.0–46.0)
Lymphs Abs: 2.6 10*3/uL (ref 0.7–4.0)
MCHC: 31.8 g/dL (ref 30.0–36.0)
MCV: 83.7 fl (ref 78.0–100.0)
Monocytes Absolute: 0.4 10*3/uL (ref 0.1–1.0)
Monocytes Relative: 5.2 % (ref 3.0–12.0)
Neutro Abs: 4.6 10*3/uL (ref 1.4–7.7)
Neutrophils Relative %: 58.6 % (ref 43.0–77.0)
Platelets: 312 10*3/uL (ref 150.0–400.0)
RBC: 4.87 Mil/uL (ref 3.87–5.11)
RDW: 13.9 % (ref 11.5–15.5)
WBC: 7.9 10*3/uL (ref 4.0–10.5)

## 2022-06-13 LAB — LIPID PANEL
Cholesterol: 200 mg/dL (ref 0–200)
HDL: 51.3 mg/dL (ref >39.00)
LDL Cholesterol: 134 mg/dL — ABNORMAL HIGH (ref 0–99)
NonHDL: 148.53
Total CHOL/HDL Ratio: 4
Triglycerides: 75 mg/dL (ref 0.0–149.0)
VLDL: 15 mg/dL (ref 0.0–40.0)

## 2022-06-13 LAB — HEMOGLOBIN A1C: Hgb A1c MFr Bld: 5.9 % (ref 4.6–6.5)

## 2022-06-13 LAB — TSH: TSH: 0.84 u[IU]/mL (ref 0.35–5.50)

## 2022-06-13 MED ORDER — TRAZODONE HCL 50 MG PO TABS
25.0000 mg | ORAL_TABLET | Freq: Every evening | ORAL | 3 refills | Status: DC | PRN
  Filled 2022-06-13: qty 30, 30d supply, fill #0
  Filled 2022-07-25: qty 30, 30d supply, fill #1
  Filled 2022-10-07: qty 30, 30d supply, fill #2

## 2022-06-13 NOTE — Progress Notes (Signed)
Christina Valenzuela is a 47 y.o. female with the following history as recorded in EpicCare:  Patient Active Problem List   Diagnosis Date Noted   Enlarged lymph node 01/17/2020   Capsulitis of right shoulder 08/24/2019   Trigger ring finger of right hand 08/24/2019   Effusion of both knee joints 07/13/2019   Cervical radiculopathy 07/13/2019   Pelvic pain 07/29/2018   Acute on chronic cholecystitis s/p lap cholecystectomy 01/05/2017 01/05/2017   MRSA (methicillin resistant Staphylococcus aureus) colonization 01/05/2017   Mixed incontinence 10/24/2016   Chronic anemia 09/08/2015   Pica 09/08/2015   Primary insomnia 08/22/2015   Depression with anxiety 08/22/2015   Alcohol abuse 08/22/2015   Headache 08/22/2015   UTI symptoms 02/16/2014   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    Current Outpatient Medications  Medication Sig Dispense Refill   SUMAtriptan (IMITREX) 100 MG tablet Take 1 tablet (100 mg total) by mouth every 2 (two) hours as needed for migraine. May repeat in 2 hours if headache persists or recurs. 10 tablet 0   traZODone (DESYREL) 50 MG tablet Take 0.5-1 tablets (25-50 mg total) by mouth at bedtime as needed for sleep. 30 tablet 3   Varenicline Tartrate, Starter, (CHANTIX STARTING MONTH PAK) 0.5 MG X 11 & 1 MG X 42 TBPK Take as directed to quit smoking (Patient not taking: Reported on 06/13/2022) 53 each 0   No current facility-administered medications for this visit.    Allergies: Hydrocodone-acetaminophen and Latex  Past Medical History:  Diagnosis Date   Depression     Past Surgical History:  Procedure Laterality Date   ABDOMINAL HYSTERECTOMY  11/28/2016   [facility] - Dr Zigmund Daniel.  UTERINE LEIOMYOMA w/ MENORRHAGIA   CESAREAN SECTION     CESAREAN SECTION     CHOLECYSTECTOMY N/A 01/05/2017   Procedure: LAPAROSCOPIC CHOLECYSTECTOMY WITH INTRAOPERATIVE CHOLANGIOGRAM;  Surgeon: Michael Boston, MD;  Location: [facility];  Service: General;  Laterality: N/A;   EPIGASTRIC HERNIA REPAIR      with mesh.  Dr Excell Seltzer   TUBAL LIGATION      Family History  Problem Relation Age of Onset   Diabetes Maternal Grandfather     Social History   Tobacco Use   Smoking status: Every Day    Packs/day: 0.25    Types: Cigarettes   Smokeless tobacco: Never  Substance Use Topics   Alcohol use: Not Currently    Subjective:  Presents for yearly CPE; does see GYN regularly; had colonoscopy in 2018- thinks done through [facility];  Having increased problems with sleeping- does snore/ wakes herself up;  Has been able to quit smoking;   Review of Systems  Constitutional: Negative.   HENT: Negative.    Eyes: Negative.   Respiratory: Negative.    Cardiovascular: Negative.   Gastrointestinal: Negative.   Genitourinary: Negative.   Musculoskeletal: Negative.   Skin: Negative.   Neurological: Negative.   Endo/Heme/Allergies: Negative.   Psychiatric/Behavioral: Negative.         Objective:  Vitals:   06/13/22 0853  BP: 116/74  Pulse: 97  Resp: 18  SpO2: 100%  Weight: 175 lb (79.4 kg)  Height: 5' 2"$  (1.575 m)    General: Well developed, well nourished, in no acute distress  Skin : Warm and dry.  Head: Normocephalic and atraumatic  Eyes: Sclera and conjunctiva clear; pupils round and reactive to light; extraocular movements intact  Ears: External normal; canals clear; tympanic membranes normal  Oropharynx: Pink, supple. No suspicious lesions  Neck: Supple without thyromegaly, adenopathy  Lungs: Respirations unlabored; clear to auscultation bilaterally without wheeze, rales, rhonchi  CVS exam: normal rate and regular rhythm.  Abdomen: Soft; nontender; nondistended; normoactive bowel sounds; no masses or hepatosplenomegaly  Musculoskeletal: No deformities; no active joint inflammation  Extremities: No edema, cyanosis, clubbing  Vessels: Symmetric bilaterally  Neurologic: Alert and oriented; speech intact; face symmetrical; moves all extremities well; CNII-XII intact  without focal deficit  Assessment:  1. PE (physical exam), annual   2. Abnormal screening mammogram   3. Snoring   4. Excessive daytime sleepiness   5. Lipid screening   6. Weight gain     Plan:  Age appropriate preventive healthcare needs addressed; encouraged regular eye doctor and dental exams; encouraged regular exercise; will update labs and refills as needed today; follow-up to be determined; Sleep study scheduled; stressed need to get her mammogram updated;  Found colonoscopy report from 2018- plan for 10 year follow up;  No follow-ups on file.  Orders Placed This Encounter  Procedures   MM Digital Diagnostic Bilat    Standing Status:   Future    Standing Expiration Date:   06/14/2023    Order Specific Question:   Reason for Exam (SYMPTOM  OR DIAGNOSIS REQUIRED)    Answer:   history of abnormal screening mammogram/ was due in June 2022    Order Specific Question:   Is the patient pregnant?    Answer:   No    Order Specific Question:   Preferred imaging location?    Answer:   [facility]   CBC with Differential/Platelet   Comp Met (CMET)   Lipid panel   TSH   Hemoglobin A1c   Ambulatory referral to Neurology    Referral Priority:   Routine    Referral Type:   Consultation    Referral Reason:   Specialty Services Required    Requested Specialty:   Neurology    Number of Visits Requested:   1    Requested Prescriptions   Signed Prescriptions Disp Refills   traZODone (DESYREL) 50 MG tablet 30 tablet 3    Sig: Take 0.5-1 tablets (25-50 mg total) by mouth at bedtime as needed for sleep.

## 2022-06-19 ENCOUNTER — Other Ambulatory Visit: Payer: Self-pay | Admitting: Family

## 2022-06-19 DIAGNOSIS — G4730 Sleep apnea, unspecified: Secondary | ICD-10-CM

## 2022-07-04 ENCOUNTER — Institutional Professional Consult (permissible substitution): Payer: 59 | Admitting: Pulmonary Disease

## 2022-07-23 ENCOUNTER — Ambulatory Visit (INDEPENDENT_AMBULATORY_CARE_PROVIDER_SITE_OTHER): Payer: 59 | Admitting: Pulmonary Disease

## 2022-07-23 ENCOUNTER — Encounter: Payer: Self-pay | Admitting: Pulmonary Disease

## 2022-07-23 VITALS — BP 112/84 | HR 96 | Ht 62.0 in | Wt 188.2 lb

## 2022-07-23 DIAGNOSIS — R0683 Snoring: Secondary | ICD-10-CM | POA: Diagnosis not present

## 2022-07-23 DIAGNOSIS — R5382 Chronic fatigue, unspecified: Secondary | ICD-10-CM

## 2022-07-23 NOTE — Progress Notes (Signed)
Christina Valenzuela    [id number]    [birth year]  Primary Care Physician:Murray, Marvis Repress, [place]  Referring Physician: Marrian Salvage, [address] [place],  [place] [address]  Chief complaint:   Patient being seen for snoring, daytime fatigue Choking episodes at night  HPI:  Has had difficulty sleeping without taking medications for a long time Difficult to fall asleep Multiple awakenings Waking up choking  Usually goes to bed between 930 and 10 Without taking any meds may take up to 2 to 3 hours to fall asleep 4-5 awakenings Final wake up time between 5 and 5:30 AM  Does not wake up feeling restored Has been having memory issues  Choking episodes at night Has been told about snoring Has been told about erratic breathing at night  Has gained some weight recently since quitting smoking in December  She does have hypertension A sibling does have sleep apnea and dad did snore  Last meal of the evening is about 7:30 PM  Does admit to some dryness of the mouth in the mornings Occasional migraine headaches  Outpatient Encounter Medications as of 07/23/2022  Medication Sig   SUMAtriptan (IMITREX) 100 MG tablet Take 1 tablet (100 mg total) by mouth every 2 (two) hours as needed for migraine. May repeat in 2 hours if headache persists or recurs.   traZODone (DESYREL) 50 MG tablet Take 0.5-1 tablets (25-50 mg total) by mouth at bedtime as needed for sleep.   valACYclovir (VALTREX) 500 MG tablet Take by mouth.   [DISCONTINUED] Varenicline Tartrate, Starter, (CHANTIX STARTING MONTH PAK) 0.5 MG X 11 & 1 MG X 42 TBPK Take as directed to quit smoking (Patient not taking: Reported on 06/13/2022)   No facility-administered encounter medications on file as of 07/23/2022.    Allergies as of 07/23/2022 - Review Complete 07/23/2022  Allergen Reaction Noted   Hydrocodone-acetaminophen Itching 11/28/2016   Latex Swelling 07/22/2012    Past  Medical History:  Diagnosis Date   Depression     Past Surgical History:  Procedure Laterality Date   ABDOMINAL HYSTERECTOMY  11/28/2016   [facility] - Dr Zigmund Daniel.  UTERINE LEIOMYOMA w/ MENORRHAGIA   CESAREAN SECTION     CESAREAN SECTION     CHOLECYSTECTOMY N/A 01/05/2017   Procedure: LAPAROSCOPIC CHOLECYSTECTOMY WITH INTRAOPERATIVE CHOLANGIOGRAM;  Surgeon: Michael Boston, MD;  Location: [facility];  Service: General;  Laterality: N/A;   EPIGASTRIC HERNIA REPAIR     with mesh.  Dr Excell Seltzer   TUBAL LIGATION      Family History  Problem Relation Age of Onset   Diabetes Maternal Grandfather     Social History   Socioeconomic History   Marital status: Single    Spouse name: Not on file   Number of children: 5   Years of education: 12   Highest education level: Not on file  Occupational History   Not on file  Tobacco Use   Smoking status: Former    Packs/day: .25    Types: Cigarettes    Quit date: 04/21/2022    Years since quitting: 0.2   Smokeless tobacco: Never  Vaping Use   Vaping Use: Never used  Substance and Sexual Activity   Alcohol use: Not Currently   Drug use: No   Sexual activity: Yes    Partners: Male    Birth control/protection: Surgical  Other Topics Concern   Not on file  Social History Narrative  Drinks caffeine    Social Determinants of Radio broadcast assistant Strain: Not on file  Food Insecurity: Not on file  Transportation Needs: Not on file  Physical Activity: Not on file  Stress: Not on file  Social Connections: Not on file  Intimate Partner Violence: Not on file    Review of Systems  Constitutional:  Positive for fatigue.  Psychiatric/Behavioral:  Positive for sleep disturbance.     Vitals:   07/23/22 1539  BP: 112/84  Pulse: 96  SpO2: 96%     Physical Exam Constitutional:      Appearance: She is obese.  HENT:     Head: Normocephalic.     Nose: Nose normal.     Mouth/Throat:     Mouth: Mucous membranes are moist.      Comments: Mallampati 4, crowded oropharynx, relative macroglossia Cardiovascular:     Rate and Rhythm: Normal rate and regular rhythm.     Heart sounds: No murmur heard.    No friction rub.  Pulmonary:     Effort: No respiratory distress.     Breath sounds: No stridor. No wheezing or rhonchi.  Neurological:     Mental Status: She is alert.  Psychiatric:        Mood and Affect: Mood normal.       07/23/2022    3:00 PM  Results of the Epworth flowsheet  Sitting and reading 0  Watching TV 1  Sitting, inactive in a public place (e.g. a theatre or a meeting) 1  As a passenger in a car for an hour without a break 0  Lying down to rest in the afternoon when circumstances permit 0  Sitting and talking to someone 0  Sitting quietly after a lunch without alcohol 0  In a car, while stopped for a few minutes in traffic 0  Total score 2     Data Reviewed: Has no previous sleep study on record  Assessment:  Moderate probability of significant obstructive sleep apnea  Sleep onset and sleep maintenance insomnia  Class I obesity  Physiology of sleep disordered breathing was discussed with the patient Treatment options discussed with the patient   Plan/Recommendations: Because of patient's history of insomnia, likelihood of a falsely negative home sleep study is high  Patient will benefit from having an in lab split-night study  Weight loss efforts encouraged  Continue trazodone as this has been helping at 100 mg  Tentative follow-up in about 3 months  Encouraged to call with significant concerns   Sherrilyn Rist MD [facility] 07/23/2022, 3:46 PM  CC: Marrian Salvage,*

## 2022-07-23 NOTE — Patient Instructions (Signed)
Moderate progressive significant obstructive sleep apnea  Insomnia  We will schedule you for an in lab sleep study -Schedule split-night study  Continue using trazodone to help you get an adequate number of hours of sleep  Continue weight loss efforts  Call us with significant concerns

## 2022-07-23 NOTE — Addendum Note (Signed)
Addended by: June Leap on: 07/23/2022 04:32 PM   Modules accepted: Orders

## 2022-07-25 ENCOUNTER — Other Ambulatory Visit: Payer: Self-pay | Admitting: Family

## 2022-07-25 ENCOUNTER — Other Ambulatory Visit (HOSPITAL_BASED_OUTPATIENT_CLINIC_OR_DEPARTMENT_OTHER): Payer: Self-pay

## 2022-07-25 ENCOUNTER — Other Ambulatory Visit: Payer: Self-pay

## 2022-07-25 MED ORDER — SUMATRIPTAN SUCCINATE 100 MG PO TABS
100.0000 mg | ORAL_TABLET | ORAL | 0 refills | Status: DC | PRN
  Filled 2022-07-25: qty 9, 30d supply, fill #0

## 2022-08-04 ENCOUNTER — Other Ambulatory Visit (HOSPITAL_BASED_OUTPATIENT_CLINIC_OR_DEPARTMENT_OTHER): Payer: Self-pay

## 2022-08-04 ENCOUNTER — Other Ambulatory Visit: Payer: Self-pay

## 2022-08-04 ENCOUNTER — Other Ambulatory Visit: Payer: Self-pay | Admitting: Family

## 2022-08-04 ENCOUNTER — Encounter: Payer: Self-pay | Admitting: Family Medicine

## 2022-08-04 DIAGNOSIS — B009 Herpesviral infection, unspecified: Secondary | ICD-10-CM

## 2022-08-04 MED ORDER — VALACYCLOVIR HCL 500 MG PO TABS
500.0000 mg | ORAL_TABLET | Freq: Two times a day (BID) | ORAL | 11 refills | Status: DC
  Filled 2022-08-04: qty 6, 3d supply, fill #0

## 2022-08-04 NOTE — Progress Notes (Signed)
Patient sent Mychart message requesting a refill of Valtrex. Valtrex 500 mg 1 tablet PO BID x 3 days was sent to her pharmacy. Lollie Gunner l Akane Tessier, CMA

## 2022-08-14 ENCOUNTER — Ambulatory Visit (INDEPENDENT_AMBULATORY_CARE_PROVIDER_SITE_OTHER): Payer: 59

## 2022-08-14 ENCOUNTER — Other Ambulatory Visit (HOSPITAL_COMMUNITY)
Admission: RE | Admit: 2022-08-14 | Discharge: 2022-08-14 | Disposition: A | Discharge status: Home / Self Care | Payer: 59 | Source: Ambulatory Visit | Attending: Family Medicine | Admitting: Family Medicine

## 2022-08-14 VITALS — BP 139/77 | HR 81 | Wt 185.0 lb

## 2022-08-14 DIAGNOSIS — N898 Other specified noninflammatory disorders of vagina: Secondary | ICD-10-CM | POA: Diagnosis present

## 2022-08-14 NOTE — Progress Notes (Signed)
Patient presents with vaginal irritation x 2 days. Patient requests to screen for STIs. Self swab was sent to the lab.  Lacye Mccarn l Laureano Hetzer, CMA

## 2022-08-15 LAB — CERVICOVAGINAL ANCILLARY ONLY
Bacterial Vaginitis (gardnerella): POSITIVE — AB
Candida Glabrata: NEGATIVE
Candida Vaginitis: NEGATIVE
Chlamydia: NEGATIVE
Comment: NEGATIVE
Comment: NEGATIVE
Comment: NEGATIVE
Comment: NEGATIVE
Comment: NEGATIVE
Comment: NORMAL
Neisseria Gonorrhea: NEGATIVE
Trichomonas: NEGATIVE

## 2022-08-18 ENCOUNTER — Other Ambulatory Visit: Payer: Self-pay

## 2022-08-18 ENCOUNTER — Other Ambulatory Visit (HOSPITAL_BASED_OUTPATIENT_CLINIC_OR_DEPARTMENT_OTHER): Payer: Self-pay

## 2022-08-18 ENCOUNTER — Encounter: Payer: Self-pay | Admitting: *Deleted

## 2022-08-18 DIAGNOSIS — N760 Acute vaginitis: Secondary | ICD-10-CM

## 2022-08-18 MED ORDER — METRONIDAZOLE 500 MG PO TABS
500.0000 mg | ORAL_TABLET | Freq: Two times a day (BID) | ORAL | 0 refills | Status: DC
Start: 2022-08-18 — End: 2022-09-18
  Filled 2022-08-18: qty 14, 7d supply, fill #0

## 2022-08-18 NOTE — Progress Notes (Signed)
Patient tested positive for BV. Flagyl 500 mg 1 tab PO bid x 7 days was sent to her pharmacy. Mychart message will be sent to patient. Jakarie Pember l Tanner Vigna, CMA

## 2022-09-05 ENCOUNTER — Other Ambulatory Visit (HOSPITAL_COMMUNITY): Payer: Self-pay

## 2022-09-15 ENCOUNTER — Encounter: Payer: Self-pay | Admitting: Family

## 2022-09-18 ENCOUNTER — Other Ambulatory Visit (HOSPITAL_BASED_OUTPATIENT_CLINIC_OR_DEPARTMENT_OTHER): Payer: Self-pay

## 2022-09-18 ENCOUNTER — Ambulatory Visit (INDEPENDENT_AMBULATORY_CARE_PROVIDER_SITE_OTHER): Payer: 59 | Admitting: Family Medicine

## 2022-09-18 ENCOUNTER — Encounter: Payer: Self-pay | Admitting: Family Medicine

## 2022-09-18 VITALS — BP 144/90 | HR 79 | Ht 62.0 in | Wt 179.0 lb

## 2022-09-18 DIAGNOSIS — Z01419 Encounter for gynecological examination (general) (routine) without abnormal findings: Secondary | ICD-10-CM | POA: Diagnosis not present

## 2022-09-18 DIAGNOSIS — Z1339 Encounter for screening examination for other mental health and behavioral disorders: Secondary | ICD-10-CM | POA: Diagnosis not present

## 2022-09-18 DIAGNOSIS — N760 Acute vaginitis: Secondary | ICD-10-CM

## 2022-09-18 MED ORDER — NUVESSA 1.3 % VA GEL
1.0000 | Freq: Every day | VAGINAL | 3 refills | Status: DC
  Filled 2022-09-18: qty 5, 30d supply, fill #0
  Filled 2022-10-01: qty 5, 5d supply, fill #0
  Filled 2022-10-01: qty 5, 30d supply, fill #0
  Filled 2023-01-20: qty 5, 5d supply, fill #1
  Filled 2023-05-20: qty 5, 5d supply, fill #2
  Filled 2023-07-15: qty 5, 5d supply, fill #3

## 2022-09-18 NOTE — Progress Notes (Signed)
ANNUAL EXAM Patient name: Christina Valenzuela MRN [id number]  Date of birth: 1975/10/25 Chief Complaint:   Annual Exam  History of Present Illness:   Christina Valenzuela is a 47 y.o.  [phone]  female  being seen today for a routine annual exam.  Current complaints: recurrent BV. Used boric acid in the past, which was helpful. Has female partner - no condom use, but using mild soaps, white wash cloths, etc.  Patient's last menstrual period was 08/31/2016.    Last pap n/a. Last mammogram: 2 years ago. Results were: normal.     09/18/2022    3:24 PM 06/13/2022    9:02 AM 03/11/2022   11:27 AM 02/25/2022    8:29 AM 08/30/2020   12:57 PM  Depression screen PHQ 2/9  Decreased Interest 0 0 0 0 2  Down, Depressed, Hopeless 0 0 0 0 3  PHQ - 2 Score 0 0 0 0 5  Altered sleeping 3    3  Tired, decreased energy 3    3  Change in appetite 1    0  Feeling bad or failure about yourself  1    2  Trouble concentrating 0    1  Moving slowly or fidgety/restless 0    0  Suicidal thoughts 0    0  PHQ-9 Score 8    14  Difficult doing work/chores     Very difficult        09/18/2022    3:25 PM  GAD 7 : Generalized Anxiety Score  Nervous, Anxious, on Edge 0  Control/stop worrying 0  Worry too much - different things 0  Trouble relaxing 0  Restless 0  Easily annoyed or irritable 0  Afraid - awful might happen 0  Total GAD 7 Score 0     Review of Systems:   Pertinent items are noted in HPI Denies any headaches, blurred vision, fatigue, shortness of breath, chest pain, abdominal pain, abnormal vaginal discharge/itching/odor/irritation, problems with periods, bowel movements, urination, or intercourse unless otherwise stated above. Pertinent History Reviewed:  Reviewed past medical,surgical, social and family history.  Reviewed problem list, medications and allergies. Physical Assessment:   Vitals:   09/18/22 1448 09/18/22 1455  BP: (!) 151/103 (!) 144/90  Pulse: 83 79  Weight: 179 lb  (81.2 kg)   Height: 5\' 2"  (1.575 m)   Body mass index is 32.74 kg/m.        Physical Examination:   General appearance - well appearing, and in no distress  Mental status - alert, oriented to person, place, and time  Psych:  She has a normal mood and affect  Skin - warm and dry, normal color, no suspicious lesions noted  Chest - effort normal, all lung fields clear to auscultation bilaterally  Heart - normal rate and regular rhythm  Neck:  midline trachea, no thyromegaly or nodules  Breasts - breasts appear normal, no suspicious masses, no skin or nipple changes or axillary nodes  Abdomen - soft, nontender, nondistended, no masses or organomegaly  Pelvic - VULVA: normal appearing vulva with no masses, tenderness or lesions  VAGINA: normal appearing vagina with normal color and discharge, no lesions  CERVIX: surgically absent  Extremities:  No swelling or varicosities noted  Chaperone present for exam  Assessment & Plan:  1. Well woman exam with routine gynecological exam - MM 3D SCREENING MAMMOGRAM BILATERAL BREAST; Future  2. Recurrent vaginitis Boric acid as needed for reacidification. Dolores Frame sent to pharmacy  No orders of the defined types were placed in this encounter.   Meds: No orders of the defined types were placed in this encounter.   Follow-up: No follow-ups on file.  Levie Heritage, DO 09/18/2022 3:36 PM

## 2022-09-22 ENCOUNTER — Other Ambulatory Visit (HOSPITAL_BASED_OUTPATIENT_CLINIC_OR_DEPARTMENT_OTHER): Payer: Self-pay

## 2022-10-01 ENCOUNTER — Other Ambulatory Visit (HOSPITAL_COMMUNITY): Payer: Self-pay

## 2022-10-01 ENCOUNTER — Other Ambulatory Visit (HOSPITAL_BASED_OUTPATIENT_CLINIC_OR_DEPARTMENT_OTHER): Payer: Self-pay

## 2022-10-07 ENCOUNTER — Other Ambulatory Visit (HOSPITAL_BASED_OUTPATIENT_CLINIC_OR_DEPARTMENT_OTHER): Payer: Self-pay

## 2022-10-15 ENCOUNTER — Encounter (HOSPITAL_BASED_OUTPATIENT_CLINIC_OR_DEPARTMENT_OTHER): Payer: 59 | Admitting: Pulmonary Disease

## 2022-10-21 ENCOUNTER — Ambulatory Visit (HOSPITAL_BASED_OUTPATIENT_CLINIC_OR_DEPARTMENT_OTHER): Payer: 59 | Attending: Pulmonary Disease | Admitting: Pulmonary Disease

## 2022-10-21 VITALS — Ht 62.0 in | Wt 178.0 lb

## 2022-10-21 DIAGNOSIS — G4736 Sleep related hypoventilation in conditions classified elsewhere: Secondary | ICD-10-CM | POA: Diagnosis not present

## 2022-10-21 DIAGNOSIS — R5382 Chronic fatigue, unspecified: Secondary | ICD-10-CM | POA: Insufficient documentation

## 2022-10-21 DIAGNOSIS — E669 Obesity, unspecified: Secondary | ICD-10-CM | POA: Insufficient documentation

## 2022-10-21 DIAGNOSIS — I493 Ventricular premature depolarization: Secondary | ICD-10-CM | POA: Diagnosis not present

## 2022-10-21 DIAGNOSIS — G4733 Obstructive sleep apnea (adult) (pediatric): Secondary | ICD-10-CM | POA: Diagnosis not present

## 2022-10-21 DIAGNOSIS — R519 Headache, unspecified: Secondary | ICD-10-CM | POA: Insufficient documentation

## 2022-10-21 DIAGNOSIS — R0683 Snoring: Secondary | ICD-10-CM | POA: Insufficient documentation

## 2022-10-26 ENCOUNTER — Telehealth: Payer: Self-pay | Admitting: Pulmonary Disease

## 2022-10-26 DIAGNOSIS — R0683 Snoring: Secondary | ICD-10-CM | POA: Diagnosis not present

## 2022-10-26 NOTE — Telephone Encounter (Signed)
Call patient  Sleep study result  Date of study: 10/21/2022  Impression: Mild obstructive sleep apnea Mild oxygen desaturations  Recommendation: Options of treatment for mild obstructive sleep apnea will include  1.  CPAP therapy if there is significant daytime sleepiness or other comorbidities including history of CVA or cardiac disease  -If CPAP is chosen as an option of treatment auto titrating CPAP with a pressure setting of 5-15 will be appropriate,SMALL [employer]KEL SIMPLUS FULL FACE MASK   2.  Watchful waiting with emphasis on weight loss measures, sleep position modification to optimize lateral sleep, elevating the head of the bed by about 30 degrees may also help.  3.  An oral device may be fashioned for the treatment of mild sleep disordered breathing, will involve referral to dentist.  Follow-up as previously scheduled

## 2022-10-26 NOTE — Procedures (Signed)
POLYSOMNOGRAPHY  Last, First: Christina Valenzuela, Christina Valenzuela MRN: [id number] Gender: Female Age (years): 46 Weight (lbs): 178 DOB: [birth year] BMI: 33 Primary Care: No PCP Epworth Score: 2 Referring: Tomma Lightning MD Technician: Shelah Lewandowsky Interpreting: Tomma Lightning MD Study Type: NPSG Ordered Study Type: Split Night CPAP Study date: 10/21/2022 Location: [place] CLINICAL INFORMATION Christina Valenzuela is a 47 year old Female and was referred to the sleep center for evaluation of G47.30 Sleep Apnea, Unspecified (780.57). Indications include Excessive Daytime Sleepiness, Fatigue, Morning Headaches, Obesity, Snoring, Witnessed Apneas.  MEDICATIONS Patient self administered medications include: BENADRYL. Medications administered during study include No sleep medicine administered.  SLEEP STUDY TECHNIQUE A multi-channel overnight Polysomnography study was performed. The channels recorded and monitored were central and occipital EEG, electrooculogram (EOG), submentalis EMG (chin), nasal and oral airflow, thoracic and abdominal wall motion, anterior tibialis EMG, snore microphone, electrocardiogram, and a pulse oximetry. TECHNICIAN COMMENTS Comments added by Technician: NONE Comments added by Scorer: N/A SLEEP ARCHITECTURE The study was initiated at 10:38:10 PM and terminated at 5:07:05 AM. The total recorded time was 388.9 minutes. EEG confirmed total sleep time was 336.5 minutes yielding a sleep efficiency of 86.5%. Sleep onset after lights out was 20.7 minutes with a REM latency of 94.5 minutes. The patient spent 13.8% of the night in stage N1 sleep, 63.7% in stage N2 sleep, 0.0% in stage N3 and 22.4% in REM. Wake after sleep onset (WASO) was 31.8 minutes. The Arousal Index was 21.9/hour. RESPIRATORY PARAMETERS There were a total of 55 respiratory disturbances out of which 0 were apneas ( 0 obstructive, 0 mixed, 0 central) and 55 hypopneas. The apnea/hypopnea index (AHI) was 9.8  events/hour. The central sleep apnea index was 0 events/hour. The REM AHI was 14.3 events/hour and NREM AHI was 8.5 events/hour. The supine AHI was 43.2 events/hour and the non supine AHI was 5.6 supine during 11.14% of sleep. Respiratory disturbances were associated with oxygen desaturation down to a nadir of 86.0% during sleep. The mean oxygen saturation during the study was 92.3%.   LEG MOVEMENT DATA The total leg movements were 0 with a resulting leg movement index of 0.0/hr .Associated arousal with leg movement index was 0.0/hr.  CARDIAC DATA The underlying cardiac rhythm was most consistent with sinus rhythm. Mean heart rate during sleep was 79.2 bpm. Additional rhythm abnormalities include PVCs.  IMPRESSIONS - Mild Obstructive Sleep apnea(OSA) - Electrocardiographic data showed presence of PVCs. - Moderate Oxygen Desaturation - The patient snored with moderate snoring volume. - No significant periodic leg movements(PLMs) during sleep. However, no significant associated arousals.  DIAGNOSIS - Obstructive Sleep Apnea (G47.33) - Nocturnal Hypoxemia (G47.36)  RECOMMENDATIONS - Therapeutic CPAP titration to determine optimal pressure required to alleviate sleep disordered breathing. - Auto CPAP with settings of 5-15 with heated humidification may be considered as an option of treatment of mild sleep disordered breathing - An oral device may be considered as an option for treatment and will require referral to a dentist. - Positional therapy avoiding supine position during sleep. - Avoid alcohol, sedatives and other CNS depressants that may worsen sleep apnea and disrupt normal sleep architecture. - Sleep hygiene should be reviewed to assess factors that may improve sleep quality. - Weight management and regular exercise should be initiated or continued.  [Electronically signed] 10/26/2022 05:05 PM  Virl Diamond MD NPI: [id number]

## 2022-10-28 NOTE — Telephone Encounter (Signed)
ATC x 1 LVM for patient to call our office back for sleep study result's.  

## 2022-10-30 ENCOUNTER — Other Ambulatory Visit (HOSPITAL_BASED_OUTPATIENT_CLINIC_OR_DEPARTMENT_OTHER): Payer: Self-pay

## 2022-10-30 ENCOUNTER — Other Ambulatory Visit: Payer: Self-pay

## 2022-10-30 ENCOUNTER — Encounter: Payer: Self-pay | Admitting: Family Medicine

## 2022-10-30 MED ORDER — FLUCONAZOLE 150 MG PO TABS
150.0000 mg | ORAL_TABLET | Freq: Once | ORAL | 1 refills | Status: AC
  Filled 2022-10-30: qty 1, 1d supply, fill #0

## 2022-11-21 NOTE — Telephone Encounter (Signed)
Patient was scheduled with Katie on 11/28/22 to go over sleeps study result's.   Nothing else further needed at this time.

## 2022-11-28 ENCOUNTER — Encounter: Payer: Self-pay | Admitting: Nurse Practitioner

## 2022-11-28 ENCOUNTER — Other Ambulatory Visit (HOSPITAL_BASED_OUTPATIENT_CLINIC_OR_DEPARTMENT_OTHER): Payer: Self-pay

## 2022-11-28 ENCOUNTER — Ambulatory Visit: Payer: 59 | Admitting: Nurse Practitioner

## 2022-11-28 VITALS — BP 120/86 | HR 115 | Ht 62.0 in | Wt 172.0 lb

## 2022-11-28 DIAGNOSIS — F5104 Psychophysiologic insomnia: Secondary | ICD-10-CM | POA: Diagnosis not present

## 2022-11-28 DIAGNOSIS — G4733 Obstructive sleep apnea (adult) (pediatric): Secondary | ICD-10-CM

## 2022-11-28 DIAGNOSIS — F5101 Primary insomnia: Secondary | ICD-10-CM

## 2022-11-28 MED ORDER — RAMELTEON 8 MG PO TABS
8.0000 mg | ORAL_TABLET | Freq: Every evening | ORAL | 0 refills | Status: DC | PRN
Start: 2022-11-28 — End: 2023-02-27
  Filled 2022-11-28: qty 10, 10d supply, fill #0

## 2022-11-28 NOTE — Patient Instructions (Addendum)
Your sleep study showed mild sleep apnea. You have opted to continue working on weight loss measures and sleep in a side lying position for treatment.   -Stop trazodone and benadryl -Start Ramelteon 8 mg At bedtime as needed for sleep. Take within 30 minutes of intended sleep onset with plans for 7-8 hours of sleep. Do not drive after taking. May cause AM grogginess. Do not take any over the counter sleep medications or other sedating medicines with this. It is possible that this may worsen your sleep apnea so please let me know if you have increased snoring, witnessed apneas, morning headaches or other new symptoms. If this works well for you, please call me so I can send in a full prescription  Follow up in 6 weeks with Dr. Wynona Neat or Christina Addison Roy Snuffer,NP. If symptoms do not improve or worsen, please contact office for sooner follow up or seek emergency care.

## 2022-11-28 NOTE — Assessment & Plan Note (Signed)
She has difficulties with sleep latency and maintenance. Onset seems to be her primary concern. No success with trazodone. We will trial her on ramelteon. Medication education and side effect profile reviewed. Driving precautions discussed. She understands that this could make her sleep apnea worse and we reviewed symptoms to monitor for. Sleep hygiene measures reviewed.

## 2022-11-28 NOTE — Progress Notes (Signed)
@Patient  ID: Christina Valenzuela, female    DOB: 22-Jan-1976, 47 y.o.   MRN: [id number]  Chief Complaint  Patient presents with   Follow-up    Hst f/u     Referring provider: Olive Bass,*  HPI: 47 year old female, former smoker referred for sleep consult March 2024. Past medical history significant for cervical radiculopathy, depression with anxiety, ETOH abuse.  TEST/EVENTS:  10/21/2022 NPSG: AHI 9.8/h, SpO2 low 86%  07/23/2022: OV with Dr. Wynona Neat. Difficulties falling asleep. Multiple night awakenings. If she doesn't take any medications, it can take up to 2-3 hours for her to fall asleep. Doesn't feel restored. Choking episodes at night. Has bee told she snores. She has gained weight since she quit smoking. Occasional migraine headaches. On trazodone 100 mg. In lab split night sleep study.  11/28/2022: Today - follow up Patient presents today for follow up to discuss home sleep study results, which revealed mild sleep apnea with AHI 9.8/h. She wants to discuss her treatment options today. She feels unchanged compared to her last visit. Her main concern is her inability to fall asleep at night. She feels tired the next day. Trazodone isn't working well for her anymore. She denies drowsy driving or sleep parasomnias/paralysis.  She also is concerned about her left ear feeling full. Sounds are muffled. She denies any pain, drainage, fevers, sinus symptoms. Right ear feels fine and no hearing changes   Allergies  Allergen Reactions   Hydrocodone-Acetaminophen Itching   Latex Swelling    Vaginal swelling    Immunization History  Administered Date(s) Administered   Influenza,inj,Quad PF,6+ Mos 04/09/2016   PFIZER(Purple Top)SARS-COV-2 Vaccination 11/07/2019   Pneumococcal Polysaccharide-23 08/22/2015    Past Medical History:  Diagnosis Date   Depression     Tobacco History: Social History   Tobacco Use  Smoking Status Former   Current packs/day: 0.00   Types:  Cigarettes   Quit date: 04/21/2022   Years since quitting: 0.6  Smokeless Tobacco Never   Counseling given: Not Answered   Outpatient Medications Prior to Visit  Medication Sig Dispense Refill   metroNIDAZOLE (NUVESSA) 1.3 % GEL Place 1 applicator vaginally at bedtime. 5 g 3   SUMAtriptan (IMITREX) 100 MG tablet Take 1 tablet (100 mg total) by mouth every 2 (two) hours as needed for migraine. May repeat in 2 hours if headache persists or recurs. 10 tablet 0   valACYclovir (VALTREX) 500 MG tablet Take by mouth.     valACYclovir (VALTREX) 500 MG tablet Take 1 tablet (500 mg total) by mouth 2 (two) times daily. 6 tablet 11   traZODone (DESYREL) 50 MG tablet Take 0.5-1 tablets (25-50 mg total) by mouth at bedtime as needed for sleep. 30 tablet 3   No facility-administered medications prior to visit.     Review of Systems:   Constitutional: No weight loss or gain, night sweats, fevers, chills, or lassitude. +fatigue  HEENT: No difficulty swallowing, tooth/dental problems, or sore throat. No sneezing, itching, ear ache, nasal congestion, or post nasal drip. +headaches CV:  No chest pain, orthopnea, PND, swelling in lower extremities, anasarca, dizziness, palpitations, syncope Resp: +snoring. No shortness of breath with exertion or at rest. No excess mucus or change in color of mucus. No productive or non-productive. No hemoptysis. No wheezing.  No chest wall deformity GI:  No heartburn, indigestion GU: No nocturia Skin: No rash, lesions, ulcerations MSK:  No joint pain or swelling. Neuro: No dizziness or lightheadedness.  Psych: No depression or  anxiety. Mood stable. +sleep disturbance    Physical Exam:  BP 120/86   Pulse (!) 115   Ht 5\' 2"  (1.575 m)   Wt 172 lb (78 kg)   LMP 08/31/2016   SpO2 97%   BMI 31.46 kg/m   GEN: Pleasant, interactive, well-appearing; obese; in no acute distress HEENT:  Normocephalic and atraumatic. L EAC cerumen impaction. R EAC patent bilaterally. R  TM pearly gray with present light reflex bilaterally. PERRLA. Sclera white. Nasal turbinates pink, moist and patent bilaterally. No rhinorrhea present. Oropharynx pink and moist, without exudate or edema. No lesions, ulcerations, or postnasal drip.  NECK:  Supple w/ fair ROM. No JVD present. Normal carotid impulses w/o bruits. Thyroid symmetrical with no goiter or nodules palpated. No lymphadenopathy.   CV: RRR, no m/r/g, no peripheral edema. Pulses intact, +2 bilaterally. No cyanosis, pallor or clubbing. PULMONARY:  Unlabored, regular breathing. Clear bilaterally A&P w/o wheezes/rales/rhonchi. No accessory muscle use.  GI: BS present and normoactive. Soft, non-tender to palpation. No organomegaly or masses detected.  MSK: No erythema, warmth or tenderness. Cap refil <2 sec all extrem. No deformities or joint swelling noted.  Neuro: A/Ox3. No focal deficits noted.   Skin: Warm, no lesions or rashe Psych: Normal affect and behavior. Judgement and thought content appropriate.     Lab Results:  CBC    Component Value Date/Time   WBC 7.9 06/13/2022 0936   RBC 4.87 06/13/2022 0936   HGB 13.0 06/13/2022 0936   HCT 40.8 06/13/2022 0936   PLT 312.0 06/13/2022 0936   MCV 83.7 06/13/2022 0936   MCH 22.9 (L) 12/01/2018 1528   MCHC 31.8 06/13/2022 0936   RDW 13.9 06/13/2022 0936   LYMPHSABS 2.6 06/13/2022 0936   MONOABS 0.4 06/13/2022 0936   EOSABS 0.2 06/13/2022 0936   BASOSABS 0.1 06/13/2022 0936    BMET    Component Value Date/Time   NA 138 06/13/2022 0936   K 4.7 06/13/2022 0936   CL 105 06/13/2022 0936   CO2 27 06/13/2022 0936   GLUCOSE 87 06/13/2022 0936   BUN 14 06/13/2022 0936   CREATININE 0.79 06/13/2022 0936   CREATININE 0.73 05/30/2015 1600   CALCIUM 9.1 06/13/2022 0936   GFRNONAA >60 12/01/2018 1528   GFRAA >60 12/01/2018 1528    BNP No results found for: "BNP"   Imaging:  No results found.  Administration History     None           No data to display           No results found for: "NITRICOXIDE"      Assessment & Plan:   Mild obstructive sleep apnea Mild OSA with minimal oxygen desaturations. Reviewed risks of untreated mild OSA and potential treatment options. She would like to continue working on weight loss measures and utilize positional sleeping. Aware of safe driving practices. Will notify if symptoms do not improve or worsen.  Patient Instructions  Your sleep study showed mild sleep apnea. You have opted to continue working on weight loss measures and sleep in a side lying position for treatment.   -Stop trazodone and benadryl -Start Ramelteon 8 mg At bedtime as needed for sleep. Take within 30 minutes of intended sleep onset with plans for 7-8 hours of sleep. Do not drive after taking. May cause AM grogginess. Do not take any over the counter sleep medications or other sedating medicines with this. It is possible that this may worsen your sleep apnea so please let  me know if you have increased snoring, witnessed apneas, morning headaches or other new symptoms. If this works well for you, please call me so I can send in a full prescription  Follow up in 6 weeks with Dr. Wynona Neat or Florentina Addison Dajai Wahlert,NP. If symptoms do not improve or worsen, please contact office for sooner follow up or seek emergency care.    Primary insomnia She has difficulties with sleep latency and maintenance. Onset seems to be her primary concern. No success with trazodone. We will trial her on ramelteon. Medication education and side effect profile reviewed. Driving precautions discussed. She understands that this could make her sleep apnea worse and we reviewed symptoms to monitor for. Sleep hygiene measures reviewed.    I spent 35 minutes of dedicated to the care of this patient on the date of this encounter to include pre-visit review of records, face-to-face time with the patient discussing conditions above, post visit ordering of testing, clinical  documentation with the electronic health record, making appropriate referrals as documented, and communicating necessary findings to members of the patients care team.  Noemi Chapel, NP 11/28/2022  Pt aware and understands NP's role.

## 2022-11-28 NOTE — Assessment & Plan Note (Signed)
Mild OSA with minimal oxygen desaturations. Reviewed risks of untreated mild OSA and potential treatment options. She would like to continue working on weight loss measures and utilize positional sleeping. Aware of safe driving practices. Will notify if symptoms do not improve or worsen.  Patient Instructions  Your sleep study showed mild sleep apnea. You have opted to continue working on weight loss measures and sleep in a side lying position for treatment.   -Stop trazodone and benadryl -Start Ramelteon 8 mg At bedtime as needed for sleep. Take within 30 minutes of intended sleep onset with plans for 7-8 hours of sleep. Do not drive after taking. May cause AM grogginess. Do not take any over the counter sleep medications or other sedating medicines with this. It is possible that this may worsen your sleep apnea so please let me know if you have increased snoring, witnessed apneas, morning headaches or other new symptoms. If this works well for you, please call me so I can send in a full prescription  Follow up in 6 weeks with Dr. Wynona Neat or Christina Addison Bernie Fobes,NP. If symptoms do not improve or worsen, please contact office for sooner follow up or seek emergency care.

## 2023-01-13 ENCOUNTER — Ambulatory Visit: Payer: 59 | Admitting: Nurse Practitioner

## 2023-01-20 ENCOUNTER — Other Ambulatory Visit: Payer: Self-pay

## 2023-01-21 ENCOUNTER — Other Ambulatory Visit (HOSPITAL_BASED_OUTPATIENT_CLINIC_OR_DEPARTMENT_OTHER): Payer: Self-pay

## 2023-01-26 ENCOUNTER — Other Ambulatory Visit (HOSPITAL_COMMUNITY)
Admission: RE | Admit: 2023-01-26 | Discharge: 2023-01-26 | Disposition: A | Discharge status: Home / Self Care | Payer: 59 | Source: Ambulatory Visit | Attending: Family Medicine | Admitting: Family Medicine

## 2023-01-26 ENCOUNTER — Ambulatory Visit (INDEPENDENT_AMBULATORY_CARE_PROVIDER_SITE_OTHER): Payer: 59

## 2023-01-26 VITALS — BP 128/101 | HR 89 | Wt 169.0 lb

## 2023-01-26 DIAGNOSIS — Z113 Encounter for screening for infections with a predominantly sexual mode of transmission: Secondary | ICD-10-CM | POA: Diagnosis present

## 2023-01-26 NOTE — Progress Notes (Signed)
Patient presents for routine STD screening. Patient was to the lab to have labs drawn. Self swab was sent to the lab.  Hager Compston l Hutch Rhett, CMA

## 2023-01-27 LAB — CERVICOVAGINAL ANCILLARY ONLY
Bacterial Vaginitis (gardnerella): POSITIVE — AB
Candida Glabrata: NEGATIVE
Candida Vaginitis: NEGATIVE
Chlamydia: NEGATIVE
Comment: NEGATIVE
Comment: NEGATIVE
Comment: NEGATIVE
Comment: NEGATIVE
Comment: NEGATIVE
Comment: NORMAL
Neisseria Gonorrhea: NEGATIVE
Trichomonas: NEGATIVE

## 2023-01-27 LAB — HIV ANTIBODY (ROUTINE TESTING W REFLEX): HIV Screen 4th Generation wRfx: NONREACTIVE

## 2023-01-27 LAB — HEPATITIS B SURFACE ANTIGEN: Hepatitis B Surface Ag: NEGATIVE

## 2023-01-27 LAB — RPR: RPR Ser Ql: NONREACTIVE

## 2023-01-27 LAB — HEPATITIS C ANTIBODY: Hep C Virus Ab: NONREACTIVE

## 2023-01-28 ENCOUNTER — Other Ambulatory Visit: Payer: Self-pay | Admitting: Obstetrics and Gynecology

## 2023-01-28 DIAGNOSIS — B9689 Other specified bacterial agents as the cause of diseases classified elsewhere: Secondary | ICD-10-CM

## 2023-01-28 MED ORDER — METRONIDAZOLE 500 MG PO TABS
500.0000 mg | ORAL_TABLET | Freq: Two times a day (BID) | ORAL | 0 refills | Status: DC
Start: 2023-01-28 — End: 2023-06-04
  Filled 2023-01-28: qty 14, 7d supply, fill #0

## 2023-01-29 ENCOUNTER — Other Ambulatory Visit (HOSPITAL_BASED_OUTPATIENT_CLINIC_OR_DEPARTMENT_OTHER): Payer: Self-pay

## 2023-02-27 ENCOUNTER — Encounter: Payer: Self-pay | Admitting: Nurse Practitioner

## 2023-02-27 ENCOUNTER — Other Ambulatory Visit: Payer: Self-pay

## 2023-02-27 ENCOUNTER — Ambulatory Visit (INDEPENDENT_AMBULATORY_CARE_PROVIDER_SITE_OTHER): Payer: 59 | Admitting: Nurse Practitioner

## 2023-02-27 ENCOUNTER — Other Ambulatory Visit: Payer: Self-pay | Admitting: Family

## 2023-02-27 ENCOUNTER — Other Ambulatory Visit (HOSPITAL_BASED_OUTPATIENT_CLINIC_OR_DEPARTMENT_OTHER): Payer: Self-pay

## 2023-02-27 ENCOUNTER — Encounter: Payer: Self-pay | Admitting: Family

## 2023-02-27 VITALS — BP 147/104 | HR 90 | Temp 97.4°F | Ht 62.0 in | Wt 172.0 lb

## 2023-02-27 DIAGNOSIS — R3589 Other polyuria: Secondary | ICD-10-CM | POA: Insufficient documentation

## 2023-02-27 DIAGNOSIS — F5104 Psychophysiologic insomnia: Secondary | ICD-10-CM

## 2023-02-27 DIAGNOSIS — F5101 Primary insomnia: Secondary | ICD-10-CM | POA: Diagnosis not present

## 2023-02-27 DIAGNOSIS — G4733 Obstructive sleep apnea (adult) (pediatric): Secondary | ICD-10-CM | POA: Diagnosis not present

## 2023-02-27 DIAGNOSIS — I10 Essential (primary) hypertension: Secondary | ICD-10-CM | POA: Insufficient documentation

## 2023-02-27 DIAGNOSIS — E66811 Obesity, class 1: Secondary | ICD-10-CM | POA: Insufficient documentation

## 2023-02-27 LAB — HEMOGLOBIN A1C: Hgb A1c MFr Bld: 5.7 % (ref 4.6–6.5)

## 2023-02-27 MED ORDER — AMLODIPINE BESYLATE 5 MG PO TABS
5.0000 mg | ORAL_TABLET | Freq: Every day | ORAL | 0 refills | Status: DC
  Filled 2023-02-27: qty 30, 30d supply, fill #0

## 2023-02-27 MED ORDER — RAMELTEON 8 MG PO TABS
8.0000 mg | ORAL_TABLET | Freq: Every evening | ORAL | 5 refills | Status: AC | PRN
  Filled 2023-02-27: qty 30, 30d supply, fill #0

## 2023-02-27 NOTE — Assessment & Plan Note (Signed)
BMI 31. Healthy weight loss encouraged.  

## 2023-02-27 NOTE — Assessment & Plan Note (Signed)
Advised to follow up with PCP regarding elevated BP readings. ED precautions reviewed.

## 2023-02-27 NOTE — Telephone Encounter (Signed)
Called pt and advised pt Rx has been sent in. Also advised pt PCP would like pt to come in for a follow up, asked pt to call the office back to go ahead and get an appointment scheduled.

## 2023-02-27 NOTE — Assessment & Plan Note (Signed)
Improved with ramelteon use. Will refill rx today. Advised to not drive or operate heavy machinery after taking. Side effect profile reviewed. Understands to monitor for s/s of worsening sleep apnea while taking this. Sleep hygiene reviewed.  Patient Instructions  Your sleep study showed mild sleep apnea. You have opted to continue working on weight loss measures and sleep in a side lying position for treatment.    Continue Ramelteon 8 mg At bedtime as needed for sleep. Take within 30 minutes of intended sleep onset with plans for 7-8 hours of sleep. Do not drive after taking. May cause AM grogginess. Do not take any over the counter sleep medications or other sedating medicines with this. It is possible that this may worsen your sleep apnea so please let me know if you have increased snoring, witnessed apneas, morning headaches or other new symptoms.   We'll check you hemoglobin A1c   Follow up in 6 months with Dr. Wynona Neat or Florentina Addison [place],NP. If symptoms do not improve or worsen, please contact office for sooner follow up or seek emergency care.

## 2023-02-27 NOTE — Assessment & Plan Note (Signed)
Mild OSA. Working on healthy weight loss measure and utilizing side lying sleeping position.

## 2023-02-27 NOTE — Progress Notes (Signed)
@Patient  ID: Christina Valenzuela, female    DOB: Nov 09, 1975, 47 y.o.   MRN: [id number]  Chief Complaint  Patient presents with   Follow-up    Referring provider: Olive Bass,*  HPI: 47 year old female, former smoker followed for chronic insomnia and mild OSA. Past medical history significant for cervical radiculopathy, depression with anxiety, ETOH abuse.  TEST/EVENTS:  10/21/2022 NPSG: AHI 9.8/h, SpO2 low 86%  07/23/2022: OV with Dr. Wynona Neat. Difficulties falling asleep. Multiple night awakenings. If she doesn't take any medications, it can take up to 2-3 hours for her to fall asleep. Doesn't feel restored. Choking episodes at night. Has bee told she snores. She has gained weight since she quit smoking. Occasional migraine headaches. On trazodone 100 mg. In lab split night sleep study.  11/28/2022: Ov with Etrulia Zarr NP for follow up to discuss home sleep study results, which revealed mild sleep apnea with AHI 9.8/h. She wants to discuss her treatment options today. She feels unchanged compared to her last visit. Her main concern is her inability to fall asleep at night. She feels tired the next day. Trazodone isn't working well for her anymore. She denies drowsy driving or sleep parasomnias/paralysis.  She also is concerned about her left ear feeling full. Sounds are muffled. She denies any pain, drainage, fevers, sinus symptoms. Right ear feels fine and no hearing changes   02/27/2023: Today - follow up Patient presents today for follow up. After our last visit, she started the ramelteon. This worked great for her. She felt like she hadn't slept that restfully in years. Woke up actually feeling refreshed. It did not make her groggy in the morning. She ran out of this so she hasn't been using it recently. She didn't realize to call for a refill. She would like to go back on it. She denies any drowsy driving, morning headaches, sleep parasomnias/paralysis.  She's been having some issues  with her blood pressure recently. She noticed it was high at her GYN appt last month. It was elevated today. She thinks it might be related to stress at her job. She has been having some problems with migraine headaches, and is now concerned it's related to her BP. She's not been checking it at home. Denies chest pain, vision changes, palpitations, leg swelling, orthopnea, PND.  She's also been having issues with frequent urination at night. She does tend to urinate a lot during the day as well. She tries to avoid drinking much before bed. She is thirsty a lot of the time. She has been told she has prediabetes before but has not had any recent testing. No unintentional weight loss, recent infections, vision changes, dizziness.   Allergies  Allergen Reactions   Hydrocodone-Acetaminophen Itching   Latex Swelling    Vaginal swelling    Immunization History  Administered Date(s) Administered   Influenza,inj,Quad PF,6+ Mos 04/09/2016   PFIZER(Purple Top)SARS-COV-2 Vaccination 11/07/2019   Pneumococcal Polysaccharide-23 08/22/2015    Past Medical History:  Diagnosis Date   Depression     Tobacco History: Social History   Tobacco Use  Smoking Status Former   Current packs/day: 0.00   Types: Cigarettes   Quit date: 04/21/2022   Years since quitting: 0.8  Smokeless Tobacco Never   Counseling given: Not Answered   Outpatient Medications Prior to Visit  Medication Sig Dispense Refill   metroNIDAZOLE (FLAGYL) 500 MG tablet Take 1 tablet (500 mg total) by mouth 2 (two) times daily. 14 tablet 0   metroNIDAZOLE (NUVESSA)  1.3 % GEL Place 1 applicator vaginally at bedtime. 5 g 3   valACYclovir (VALTREX) 500 MG tablet Take by mouth.     valACYclovir (VALTREX) 500 MG tablet Take 1 tablet (500 mg total) by mouth 2 (two) times daily. 6 tablet 11   ramelteon (ROZEREM) 8 MG tablet Take 1 tablet (8 mg total) by mouth at bedtime as needed for sleep. 10 tablet 0   SUMAtriptan (IMITREX) 100 MG tablet  Take 1 tablet (100 mg total) by mouth every 2 (two) hours as needed for migraine. May repeat in 2 hours if headache persists or recurs. 10 tablet 0   No facility-administered medications prior to visit.     Review of Systems:   Constitutional: No weight loss or gain, night sweats, fevers, chills, or lassitude. +fatigue (improved with ramelteon) HEENT: No difficulty swallowing, tooth/dental problems, or sore throat. No sneezing, itching, ear ache, nasal congestion, or post nasal drip. +headaches CV:  No chest pain, orthopnea, PND, swelling in lower extremities, anasarca, dizziness, palpitations, syncope Resp: No shortness of breath with exertion or at rest. No excess mucus or change in color of mucus. No productive or non-productive. No hemoptysis. No wheezing.  No chest wall deformity GI:  No heartburn, indigestion GU: No dysuria  Skin: No rash, lesions, ulcerations MSK:  No joint pain or swelling. Endocrine: +polyuria, polydipsia  Neuro: No dizziness or lightheadedness.  Psych: No depression or anxiety. Mood stable. +sleep disturbance (improved with ramelteon)    Physical Exam:  BP (!) 147/104   Pulse 90   Temp (!) 97.4 F (36.3 C) (Oral)   Ht 5\' 2"  (1.575 m)   Wt 172 lb (78 kg)   LMP 08/31/2016   SpO2 96%   BMI 31.46 kg/m   GEN: Pleasant, interactive, well-appearing; obese; in no acute distress HEENT:  Normocephalic and atraumatic. PERRLA. Sclera white. Nasal turbinates pink, moist and patent bilaterally. No rhinorrhea present. Oropharynx pink and moist, without exudate or edema. No lesions, ulcerations, or postnasal drip.  NECK:  Supple w/ fair ROM. No JVD present. Normal carotid impulses w/o bruits. Thyroid symmetrical with no goiter or nodules palpated. No lymphadenopathy.   CV: RRR, no m/r/g, no peripheral edema. Pulses intact, +2 bilaterally. No cyanosis, pallor or clubbing. PULMONARY:  Unlabored, regular breathing. Clear bilaterally A&P w/o wheezes/rales/rhonchi. No  accessory muscle use.  GI: BS present and normoactive. Soft, non-tender to palpation. No organomegaly or masses detected.  MSK: No erythema, warmth or tenderness. Cap refil <2 sec all extrem. No deformities or joint swelling noted.  Neuro: A/Ox3. No focal deficits noted.   Skin: Warm, no lesions or rashe Psych: Normal affect and behavior. Judgement and thought content appropriate.     Lab Results:  CBC    Component Value Date/Time   WBC 7.9 06/13/2022 0936   RBC 4.87 06/13/2022 0936   HGB 13.0 06/13/2022 0936   HCT 40.8 06/13/2022 0936   PLT 312.0 06/13/2022 0936   MCV 83.7 06/13/2022 0936   MCH 22.9 (L) 12/01/2018 1528   MCHC 31.8 06/13/2022 0936   RDW 13.9 06/13/2022 0936   LYMPHSABS 2.6 06/13/2022 0936   MONOABS 0.4 06/13/2022 0936   EOSABS 0.2 06/13/2022 0936   BASOSABS 0.1 06/13/2022 0936    BMET    Component Value Date/Time   NA 138 06/13/2022 0936   K 4.7 06/13/2022 0936   CL 105 06/13/2022 0936   CO2 27 06/13/2022 0936   GLUCOSE 87 06/13/2022 0936   BUN 14 06/13/2022 0936  CREATININE 0.79 06/13/2022 0936   CREATININE 0.73 05/30/2015 1600   CALCIUM 9.1 06/13/2022 0936   GFRNONAA >60 12/01/2018 1528   GFRAA >60 12/01/2018 1528    BNP No results found for: "BNP"   Imaging:  No results found.  Administration History     None           No data to display          No results found for: "NITRICOXIDE"      Assessment & Plan:   Primary insomnia Improved with ramelteon use. Will refill rx today. Advised to not drive or operate heavy machinery after taking. Side effect profile reviewed. Understands to monitor for s/s of worsening sleep apnea while taking this. Sleep hygiene reviewed.  Patient Instructions  Your sleep study showed mild sleep apnea. You have opted to continue working on weight loss measures and sleep in a side lying position for treatment.    Continue Ramelteon 8 mg At bedtime as needed for sleep. Take within 30 minutes of  intended sleep onset with plans for 7-8 hours of sleep. Do not drive after taking. May cause AM grogginess. Do not take any over the counter sleep medications or other sedating medicines with this. It is possible that this may worsen your sleep apnea so please let me know if you have increased snoring, witnessed apneas, morning headaches or other new symptoms.   We'll check you hemoglobin A1c   Follow up in 6 months with Dr. Wynona Neat or Florentina Addison Lion Fernandez,NP. If symptoms do not improve or worsen, please contact office for sooner follow up or seek emergency care.    Mild obstructive sleep apnea Mild OSA. Working on healthy weight loss measure and utilizing side lying sleeping position.   Obesity (BMI 30.0-34.9) BMI 31. Healthy weight loss encouraged  Hypertension Advised to follow up with PCP regarding elevated BP readings. ED precautions reviewed.   Polyuria Polyuria and polydipsia. Unclear etiology. Check A1c today. Follow up with PCP if symptoms persist    I spent 35 minutes of dedicated to the care of this patient on the date of this encounter to include pre-visit review of records, face-to-face time with the patient discussing conditions above, post visit ordering of testing, clinical documentation with the electronic health record, making appropriate referrals as documented, and communicating necessary findings to members of the patients care team.  Noemi Chapel, NP 02/27/2023  Pt aware and understands NP's role.

## 2023-02-27 NOTE — Patient Instructions (Addendum)
Your sleep study showed mild sleep apnea. You have opted to continue working on weight loss measures and sleep in a side lying position for treatment.    Continue Ramelteon 8 mg At bedtime as needed for sleep. Take within 30 minutes of intended sleep onset with plans for 7-8 hours of sleep. Do not drive after taking. May cause AM grogginess. Do not take any over the counter sleep medications or other sedating medicines with this. It is possible that this may worsen your sleep apnea so please let me know if you have increased snoring, witnessed apneas, morning headaches or other new symptoms.   We'll check you hemoglobin A1c   Follow up in 6 months with Christina Valenzuela or Christina Addison Dot Splinter,NP. If symptoms do not improve or worsen, please contact office for sooner follow up or seek emergency care.

## 2023-02-27 NOTE — Assessment & Plan Note (Signed)
Polyuria and polydipsia. Unclear etiology. Check A1c today. Follow up with PCP if symptoms persist

## 2023-03-02 NOTE — Telephone Encounter (Signed)
Pt has follow up appointment with PCP 03/03/2023.

## 2023-03-03 ENCOUNTER — Ambulatory Visit (INDEPENDENT_AMBULATORY_CARE_PROVIDER_SITE_OTHER): Payer: 59 | Admitting: Family

## 2023-03-03 ENCOUNTER — Other Ambulatory Visit (HOSPITAL_BASED_OUTPATIENT_CLINIC_OR_DEPARTMENT_OTHER): Payer: Self-pay

## 2023-03-03 VITALS — BP 138/88 | HR 82 | Resp 18 | Ht 62.0 in | Wt 170.4 lb

## 2023-03-03 DIAGNOSIS — G43809 Other migraine, not intractable, without status migrainosus: Secondary | ICD-10-CM

## 2023-03-03 DIAGNOSIS — R030 Elevated blood-pressure reading, without diagnosis of hypertension: Secondary | ICD-10-CM | POA: Diagnosis not present

## 2023-03-03 LAB — COMPREHENSIVE METABOLIC PANEL
ALT: 11 U/L (ref 0–35)
AST: 13 U/L (ref 0–37)
Albumin: 4.2 g/dL (ref 3.5–5.2)
Alkaline Phosphatase: 53 U/L (ref 39–117)
BUN: 11 mg/dL (ref 6–23)
CO2: 27 meq/L (ref 19–32)
Calcium: 9.7 mg/dL (ref 8.4–10.5)
Chloride: 106 meq/L (ref 96–112)
Creatinine, Ser: 0.79 mg/dL (ref 0.40–1.20)
GFR: 88.81 mL/min (ref >60.00)
Glucose, Bld: 82 mg/dL (ref 70–99)
Potassium: 4.3 meq/L (ref 3.5–5.1)
Sodium: 139 meq/L (ref 135–145)
Total Bilirubin: 0.4 mg/dL (ref 0.2–1.2)
Total Protein: 7.1 g/dL (ref 6.0–8.3)

## 2023-03-03 LAB — CBC WITH DIFFERENTIAL/PLATELET
Basophils Absolute: 0.1 10*3/uL (ref 0.0–0.1)
Basophils Relative: 0.9 % (ref 0.0–3.0)
Eosinophils Absolute: 0.2 10*3/uL (ref 0.0–0.7)
Eosinophils Relative: 2.6 % (ref 0.0–5.0)
HCT: 43.1 % (ref 36.0–46.0)
Hemoglobin: 13.5 g/dL (ref 12.0–15.0)
Lymphocytes Relative: 38.5 % (ref 12.0–46.0)
Lymphs Abs: 3.2 10*3/uL (ref 0.7–4.0)
MCHC: 31.4 g/dL (ref 30.0–36.0)
MCV: 84.2 fL (ref 78.0–100.0)
Monocytes Absolute: 0.5 10*3/uL (ref 0.1–1.0)
Monocytes Relative: 6.4 % (ref 3.0–12.0)
Neutro Abs: 4.2 10*3/uL (ref 1.4–7.7)
Neutrophils Relative %: 51.6 % (ref 43.0–77.0)
Platelets: 318 10*3/uL (ref 150.0–400.0)
RBC: 5.12 Mil/uL — ABNORMAL HIGH (ref 3.87–5.11)
RDW: 14.3 % (ref 11.5–15.5)
WBC: 8.2 10*3/uL (ref 4.0–10.5)

## 2023-03-03 MED ORDER — AMLODIPINE BESYLATE 5 MG PO TABS
5.0000 mg | ORAL_TABLET | Freq: Every day | ORAL | 0 refills | Status: DC
  Filled 2023-03-03 – 2023-04-01 (×2): qty 90, 90d supply, fill #0

## 2023-03-03 MED ORDER — IBUPROFEN 800 MG PO TABS
800.0000 mg | ORAL_TABLET | Freq: Three times a day (TID) | ORAL | 0 refills | Status: DC | PRN
  Filled 2023-03-03: qty 30, 10d supply, fill #0

## 2023-03-03 NOTE — Progress Notes (Signed)
Christina Valenzuela is a 47 y.o. female with the following history as recorded in Epic[employer]:  Patient Active Problem List   Diagnosis Date Noted   Obesity (BMI 30.0-34.9) 02/27/2023   Hypertension 02/27/2023   Polyuria 02/27/2023   Mild obstructive sleep apnea 11/28/2022   Snoring 10/21/2022   Enlarged lymph node 01/17/2020   Capsulitis of right shoulder 08/24/2019   Trigger ring finger of right hand 08/24/2019   Effusion of both knee joints 07/13/2019   Cervical radiculopathy 07/13/2019   Pelvic pain 07/29/2018   Acute on chronic cholecystitis s/p lap cholecystectomy 01/05/2017 01/05/2017   MRSA (methicillin resistant Staphylococcus aureus) colonization 01/05/2017   Mixed incontinence 10/24/2016   Chronic anemia 09/08/2015   Pica 09/08/2015   Primary insomnia 08/22/2015   Depression with anxiety 08/22/2015   Alcohol abuse 08/22/2015   Headache 08/22/2015   UTI symptoms 02/16/2014   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    Current Outpatient Medications  Medication Sig Dispense Refill   ibuprofen (ADVIL) 800 MG tablet Take 1 tablet (800 mg total) by mouth every 8 (eight) hours as needed. 30 tablet 0   ramelteon (ROZEREM) 8 MG tablet Take 1 tablet (8 mg total) by mouth at bedtime as needed for sleep. 30 tablet 5   valACYclovir (VALTREX) 500 MG tablet Take by mouth.     valACYclovir (VALTREX) 500 MG tablet Take 1 tablet (500 mg total) by mouth 2 (two) times daily. 6 tablet 11   amLODipine (NORVASC) 5 MG tablet Take 1 tablet (5 mg total) by mouth daily. 90 tablet 0   metroNIDAZOLE (FLAGYL) 500 MG tablet Take 1 tablet (500 mg total) by mouth 2 (two) times daily. (Patient not taking: Reported on 03/03/2023) 14 tablet 0   metroNIDAZOLE (NUVESSA) 1.3 % GEL Place 1 applicator vaginally at bedtime. (Patient not taking: Reported on 03/03/2023) 5 g 3   No current facility-administered medications for this visit.    Allergies: Hydrocodone-acetaminophen and Latex  Past Medical History:   Diagnosis Date   Depression     Past Surgical History:  Procedure Laterality Date   ABDOMINAL HYSTERECTOMY  11/28/2016   [facility] - Dr Ashley Royalty.  UTERINE LEIOMYOMA w/ MENORRHAGIA   CESAREAN SECTION     CESAREAN SECTION     CHOLECYSTECTOMY N/A 01/05/2017   Procedure: LAPAROSCOPIC CHOLECYSTECTOMY WITH INTRAOPERATIVE CHOLANGIOGRAM;  Surgeon: Karie Soda, MD;  Location: [facility];  Service: General;  Laterality: N/A;   EPIGASTRIC HERNIA REPAIR     with mesh.  Dr Johna Sheriff   TUBAL LIGATION      Family History  Problem Relation Age of Onset   Diabetes Maternal Grandfather     Social History   Tobacco Use   Smoking status: Former    Current packs/day: 0.00    Types: Cigarettes    Quit date: 04/21/2022    Years since quitting: 0.8   Smokeless tobacco: Never  Substance Use Topics   Alcohol use: Not Currently    Subjective:   Presents today to follow up on hypertension/ recent start of Amlodipine; has been feeling better since starting medication but still struggling with worsening headaches; Denies any chest pain, shortness of breath, blurred vision; admits has been "eating lost of junk food"/ not exercising/ still smoking;  Known history of migraine headaches since teenage years/ feels like worsened in her 30s; tried [place] last year- did not find beneficial; notes that headaches can be so bad "she can hardly lift her head." Last discussion/ appointment about her migraines was in November 2023;  Objective:  Vitals:   03/03/23 0827 03/03/23 0846  BP: (!) 140/88 138/88  Pulse: 82   Resp: 18   SpO2: 99%   Weight: 170 lb 6.4 oz (77.3 kg)   Height: 5\' 2"  (1.575 m)     General: Well developed, well nourished, in no acute distress  Skin : Warm and dry.  Head: Normocephalic and atraumatic  Lungs: Respirations unlabored; clear to auscultation bilaterally without wheeze, rales, rhonchi  CVS exam: normal rate and regular rhythm.  Vessels: Symmetric bilaterally  Neurologic: Alert and  oriented; speech intact; face symmetrical; moves all extremities well; CNII-XII intact without focal deficit   Assessment:  1. Elevated blood pressure reading   2. Other migraine without status migrainosus, not intractable     Plan:  Responding well to Amlodipine 5 mg daily- refill updated; DASH diet discussed; encouraged to quit smoking; check CBC, CMP; follow up in 3 months, sooner prn.  Update head CT; refill for Ibuprofen 800 mg tid per patient request; refer to neurology to discuss treatment options.   No follow-ups on file.  Orders Placed This Encounter  Procedures   CT HEAD WO CONTRAST ( )    Standing Status:   Future    Standing Expiration Date:   03/02/2024    Order Specific Question:   Is patient pregnant?    Answer:   No    Order Specific Question:   Preferred imaging location?    Answer:   [facility]   CBC with Differential/Platelet   Comp Met (CMET)   Ambulatory referral to Neurology    Referral Priority:   Routine    Referral Type:   Consultation    Referral Reason:   Specialty Services Required    Requested Specialty:   Neurology    Number of Visits Requested:   1    Requested Prescriptions   Signed Prescriptions Disp Refills   ibuprofen (ADVIL) 800 MG tablet 30 tablet 0    Sig: Take 1 tablet (800 mg total) by mouth every 8 (eight) hours as needed.   amLODipine (NORVASC) 5 MG tablet 90 tablet 0    Sig: Take 1 tablet (5 mg total) by mouth daily.

## 2023-03-13 ENCOUNTER — Telehealth (HOSPITAL_BASED_OUTPATIENT_CLINIC_OR_DEPARTMENT_OTHER): Payer: Self-pay

## 2023-03-31 ENCOUNTER — Ambulatory Visit (HOSPITAL_BASED_OUTPATIENT_CLINIC_OR_DEPARTMENT_OTHER)
Admission: RE | Admit: 2023-03-31 | Discharge: 2023-03-31 | Disposition: A | Discharge status: Home / Self Care | Payer: 59 | Source: Ambulatory Visit | Attending: Family | Admitting: Family

## 2023-03-31 DIAGNOSIS — G43809 Other migraine, not intractable, without status migrainosus: Secondary | ICD-10-CM | POA: Insufficient documentation

## 2023-03-31 DIAGNOSIS — R030 Elevated blood-pressure reading, without diagnosis of hypertension: Secondary | ICD-10-CM | POA: Insufficient documentation

## 2023-04-01 ENCOUNTER — Other Ambulatory Visit: Payer: Self-pay

## 2023-04-01 ENCOUNTER — Other Ambulatory Visit (HOSPITAL_BASED_OUTPATIENT_CLINIC_OR_DEPARTMENT_OTHER): Payer: Self-pay

## 2023-05-20 ENCOUNTER — Other Ambulatory Visit: Payer: Self-pay | Admitting: Family

## 2023-05-20 ENCOUNTER — Other Ambulatory Visit: Payer: Self-pay

## 2023-05-20 ENCOUNTER — Other Ambulatory Visit (HOSPITAL_BASED_OUTPATIENT_CLINIC_OR_DEPARTMENT_OTHER): Payer: Self-pay

## 2023-05-20 ENCOUNTER — Ambulatory Visit (INDEPENDENT_AMBULATORY_CARE_PROVIDER_SITE_OTHER): Payer: 59

## 2023-05-20 ENCOUNTER — Other Ambulatory Visit (HOSPITAL_COMMUNITY)
Admission: RE | Admit: 2023-05-20 | Discharge: 2023-05-20 | Disposition: A | Discharge status: Home / Self Care | Payer: 59 | Source: Ambulatory Visit | Attending: Obstetrics & Gynecology | Admitting: Obstetrics & Gynecology

## 2023-05-20 VITALS — BP 126/89 | HR 100 | Ht 62.0 in | Wt 180.0 lb

## 2023-05-20 DIAGNOSIS — B9689 Other specified bacterial agents as the cause of diseases classified elsewhere: Secondary | ICD-10-CM | POA: Diagnosis not present

## 2023-05-20 DIAGNOSIS — Z113 Encounter for screening for infections with a predominantly sexual mode of transmission: Secondary | ICD-10-CM

## 2023-05-20 DIAGNOSIS — R300 Dysuria: Secondary | ICD-10-CM | POA: Diagnosis not present

## 2023-05-20 DIAGNOSIS — R399 Unspecified symptoms and signs involving the genitourinary system: Secondary | ICD-10-CM

## 2023-05-20 DIAGNOSIS — B3731 Acute candidiasis of vulva and vagina: Secondary | ICD-10-CM | POA: Diagnosis not present

## 2023-05-20 DIAGNOSIS — N760 Acute vaginitis: Secondary | ICD-10-CM | POA: Insufficient documentation

## 2023-05-20 MED ORDER — IBUPROFEN 800 MG PO TABS
800.0000 mg | ORAL_TABLET | Freq: Three times a day (TID) | ORAL | 0 refills | Status: DC | PRN
  Filled 2023-05-20: qty 30, 10d supply, fill #0

## 2023-05-20 MED ORDER — NITROFURANTOIN MONOHYD MACRO 100 MG PO CAPS
100.0000 mg | ORAL_CAPSULE | Freq: Two times a day (BID) | ORAL | 1 refills | Status: DC
  Filled 2023-05-20: qty 14, 7d supply, fill #0

## 2023-05-20 NOTE — Progress Notes (Addendum)
SUBJECTIVE: Christina Valenzuela is a 48 y.o. female who complains of urinary frequency, urgency and dysuria x 1 day days, without flank pain, fever, chills, or abnormal vaginal discharge or bleeding.   OBJECTIVE: Appears well, in no apparent distress.  Vital signs are normal. Urine dipstick shows positive for RBC's, positive for leukocytes, and positive for ketones.    ASSESSMENT: Dysuria  PLAN: Treatment per orders.  Call or return to clinic prn if these symptoms worsen or fail to improve as anticipated. Christina Valenzuela

## 2023-05-20 NOTE — Addendum Note (Signed)
 Addended by: Ardean Beat A on: 05/20/2023 03:54 PM   Modules accepted: Orders

## 2023-05-22 LAB — CERVICOVAGINAL ANCILLARY ONLY
Bacterial Vaginitis (gardnerella): POSITIVE — AB
Candida Glabrata: NEGATIVE
Candida Vaginitis: POSITIVE — AB
Chlamydia: NEGATIVE
Comment: NEGATIVE
Comment: NEGATIVE
Comment: NEGATIVE
Comment: NEGATIVE
Comment: NEGATIVE
Comment: NORMAL
Neisseria Gonorrhea: NEGATIVE
Trichomonas: NEGATIVE

## 2023-05-22 LAB — URINE CULTURE

## 2023-05-25 ENCOUNTER — Encounter: Payer: Self-pay | Admitting: Family Medicine

## 2023-05-25 ENCOUNTER — Other Ambulatory Visit: Payer: Self-pay | Admitting: Obstetrics and Gynecology

## 2023-05-25 DIAGNOSIS — B9689 Other specified bacterial agents as the cause of diseases classified elsewhere: Secondary | ICD-10-CM

## 2023-05-26 ENCOUNTER — Other Ambulatory Visit (HOSPITAL_BASED_OUTPATIENT_CLINIC_OR_DEPARTMENT_OTHER): Payer: Self-pay

## 2023-05-26 ENCOUNTER — Other Ambulatory Visit: Payer: Self-pay

## 2023-05-26 DIAGNOSIS — B379 Candidiasis, unspecified: Secondary | ICD-10-CM

## 2023-05-26 MED ORDER — FLUCONAZOLE 150 MG PO TABS
150.0000 mg | ORAL_TABLET | Freq: Every day | ORAL | 1 refills | Status: DC
  Filled 2023-05-26: qty 1, 1d supply, fill #0
  Filled 2023-07-15: qty 1, 1d supply, fill #1

## 2023-05-27 ENCOUNTER — Other Ambulatory Visit: Payer: Self-pay | Admitting: Obstetrics & Gynecology

## 2023-06-04 ENCOUNTER — Ambulatory Visit: Payer: 59 | Admitting: Family

## 2023-06-04 ENCOUNTER — Other Ambulatory Visit (HOSPITAL_BASED_OUTPATIENT_CLINIC_OR_DEPARTMENT_OTHER): Payer: Self-pay

## 2023-06-04 ENCOUNTER — Encounter: Payer: Self-pay | Admitting: Family

## 2023-06-04 VITALS — BP 124/82 | HR 94 | Ht 62.0 in | Wt 181.2 lb

## 2023-06-04 DIAGNOSIS — R7303 Prediabetes: Secondary | ICD-10-CM

## 2023-06-04 DIAGNOSIS — I10 Essential (primary) hypertension: Secondary | ICD-10-CM | POA: Diagnosis not present

## 2023-06-04 DIAGNOSIS — L680 Hirsutism: Secondary | ICD-10-CM | POA: Diagnosis not present

## 2023-06-04 DIAGNOSIS — Z720 Tobacco use: Secondary | ICD-10-CM | POA: Diagnosis not present

## 2023-06-04 DIAGNOSIS — L309 Dermatitis, unspecified: Secondary | ICD-10-CM

## 2023-06-04 LAB — COMPREHENSIVE METABOLIC PANEL
ALT: 11 U/L (ref 0–35)
AST: 14 U/L (ref 0–37)
Albumin: 4.2 g/dL (ref 3.5–5.2)
Alkaline Phosphatase: 57 U/L (ref 39–117)
BUN: 11 mg/dL (ref 6–23)
CO2: 27 meq/L (ref 19–32)
Calcium: 9.5 mg/dL (ref 8.4–10.5)
Chloride: 101 meq/L (ref 96–112)
Creatinine, Ser: 0.8 mg/dL (ref 0.40–1.20)
GFR: 87.32 mL/min (ref >60.00)
Glucose, Bld: 85 mg/dL (ref 70–99)
Potassium: 4.2 meq/L (ref 3.5–5.1)
Sodium: 135 meq/L (ref 135–145)
Total Bilirubin: 0.5 mg/dL (ref 0.2–1.2)
Total Protein: 6.9 g/dL (ref 6.0–8.3)

## 2023-06-04 LAB — CBC WITH DIFFERENTIAL/PLATELET
Basophils Absolute: 0.1 10*3/uL (ref 0.0–0.1)
Basophils Relative: 0.8 % (ref 0.0–3.0)
Eosinophils Absolute: 0.2 10*3/uL (ref 0.0–0.7)
Eosinophils Relative: 2.5 % (ref 0.0–5.0)
HCT: 41.8 % (ref 36.0–46.0)
Hemoglobin: 13.5 g/dL (ref 12.0–15.0)
Lymphocytes Relative: 30.6 % (ref 12.0–46.0)
Lymphs Abs: 2.4 10*3/uL (ref 0.7–4.0)
MCHC: 32.2 g/dL (ref 30.0–36.0)
MCV: 83.8 fL (ref 78.0–100.0)
Monocytes Absolute: 0.5 10*3/uL (ref 0.1–1.0)
Monocytes Relative: 6.8 % (ref 3.0–12.0)
Neutro Abs: 4.8 10*3/uL (ref 1.4–7.7)
Neutrophils Relative %: 59.3 % (ref 43.0–77.0)
Platelets: 329 10*3/uL (ref 150.0–400.0)
RBC: 4.98 Mil/uL (ref 3.87–5.11)
RDW: 13.8 % (ref 11.5–15.5)
WBC: 8 10*3/uL (ref 4.0–10.5)

## 2023-06-04 LAB — HEMOGLOBIN A1C: Hgb A1c MFr Bld: 5.9 % (ref 4.6–6.5)

## 2023-06-04 MED ORDER — TRIAMCINOLONE ACETONIDE 0.1 % EX CREA
1.0000 | TOPICAL_CREAM | Freq: Two times a day (BID) | CUTANEOUS | 0 refills | Status: AC
  Filled 2023-06-04: qty 454, 90d supply, fill #0

## 2023-06-04 MED ORDER — AMLODIPINE BESYLATE 5 MG PO TABS
5.0000 mg | ORAL_TABLET | Freq: Every day | ORAL | 3 refills | Status: DC
  Filled 2023-06-04 – 2023-07-15 (×2): qty 90, 90d supply, fill #0

## 2023-06-04 NOTE — Progress Notes (Signed)
Christina Valenzuela is a 48 y.o. female with the following history as recorded in Epic[employer]:  Patient Active Problem List   Diagnosis Date Noted   Obesity (BMI 30.0-34.9) 02/27/2023   Hypertension 02/27/2023   Polyuria 02/27/2023   Mild obstructive sleep apnea 11/28/2022   Snoring 10/21/2022   Enlarged lymph node 01/17/2020   Capsulitis of right shoulder 08/24/2019   Trigger ring finger of right hand 08/24/2019   Effusion of both knee joints 07/13/2019   Cervical radiculopathy 07/13/2019   Pelvic pain 07/29/2018   Acute on chronic cholecystitis s/p lap cholecystectomy 01/05/2017 01/05/2017   MRSA (methicillin resistant Staphylococcus aureus) colonization 01/05/2017   Mixed incontinence 10/24/2016   Chronic anemia 09/08/2015   Pica 09/08/2015   Primary insomnia 08/22/2015   Depression with anxiety 08/22/2015   Alcohol abuse 08/22/2015   Headache 08/22/2015   UTI symptoms 02/16/2014   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    Current Outpatient Medications  Medication Sig Dispense Refill   fluconazole (DIFLUCAN) 150 MG tablet Take 1 tablet (150 mg total) by mouth once. Repeat in 3 days if symptoms persist. 1 tablet 1   ibuprofen (ADVIL) 800 MG tablet Take 1 tablet (800 mg total) by mouth every 8 (eight) hours as needed. 30 tablet 0   metroNIDAZOLE (NUVESSA) 1.3 % GEL Place 1 applicator vaginally at bedtime. 5 g 3   nitrofurantoin, macrocrystal-monohydrate, (MACROBID) 100 MG capsule Take 1 capsule (100 mg total) by mouth 2 (two) times daily. 14 capsule 1   ramelteon (ROZEREM) 8 MG tablet Take 1 tablet (8 mg total) by mouth at bedtime as needed for sleep. 30 tablet 5   triamcinolone cream (KENALOG) 0.1 % Apply 1 Application topically 2 (two) times daily. 454 g 0   valACYclovir (VALTREX) 500 MG tablet Take by mouth.     valACYclovir (VALTREX) 500 MG tablet Take 1 tablet (500 mg total) by mouth 2 (two) times daily. 6 tablet 11   amLODipine (NORVASC) 5 MG tablet Take 1 tablet (5 mg total) by  mouth daily. 90 tablet 3   No current facility-administered medications for this visit.    Allergies: Hydrocodone-acetaminophen and Latex  Past Medical History:  Diagnosis Date   Depression     Past Surgical History:  Procedure Laterality Date   ABDOMINAL HYSTERECTOMY  11/28/2016   [facility] - Dr Ashley Royalty.  UTERINE LEIOMYOMA w/ MENORRHAGIA   CESAREAN SECTION     CESAREAN SECTION     CHOLECYSTECTOMY N/A 01/05/2017   Procedure: LAPAROSCOPIC CHOLECYSTECTOMY WITH INTRAOPERATIVE CHOLANGIOGRAM;  Surgeon: Karie Soda, MD;  Location: [facility];  Service: General;  Laterality: N/A;   EPIGASTRIC HERNIA REPAIR     with mesh.  Dr Johna Sheriff   TUBAL LIGATION      Family History  Problem Relation Age of Onset   Diabetes Maternal Grandfather     Social History   Tobacco Use   Smoking status: Former    Current packs/day: 0.00    Types: Cigarettes    Quit date: 04/21/2022    Years since quitting: 1.1   Smokeless tobacco: Never  Substance Use Topics   Alcohol use: Not Currently    Subjective:   3 month follow up on headaches/ recent start of Amlodipine; has felt much better since starting that medication- no further headaches; Denies any chest pain, shortness of breath, blurred vision or headache.  Concerned that having allergic reaction to new detergent; in the process of changing back to her normal detergent; skin has been itching more in the past  3 weeks;   In the process of quitting smoking- working on tapering back;     Objective:  Vitals:   06/04/23 0820  BP: 124/82  Pulse: 94  SpO2: 97%  Weight: 181 lb 3.2 oz (82.2 kg)  Height: 5\' 2"  (1.575 m)    General: Well developed, well nourished, in no acute distress  Skin : Warm and dry.  Head: Normocephalic and atraumatic  Lungs: Respirations unlabored; clear to auscultation bilaterally without wheeze, rales, rhonchi  CVS exam: normal rate and regular rhythm.  Neurologic: Alert and oriented; speech intact; face symmetrical; moves all  extremities well; CNII-XII intact without focal deficit   Assessment:  1. Pre-diabetes   2. Essential hypertension   3. Tobacco abuse   4. Hirsutism   5. Dermatitis     Plan:  Update labs today;  Stable; good response- refill updated; Encouraged to quit smoking;  To consider trial of Spironolactone; follow up to be determined based on labs;  She has switched her detergent back and does feel that symptoms are improving; Rx for Triamcinolone cream to use as needed;   No follow-ups on file.  Orders Placed This Encounter  Procedures   CBC with Differential/Platelet   Comp Met (CMET)   Hemoglobin A1c    Requested Prescriptions   Signed Prescriptions Disp Refills   amLODipine (NORVASC) 5 MG tablet 90 tablet 3    Sig: Take 1 tablet (5 mg total) by mouth daily.   triamcinolone cream (KENALOG) 0.1 % 454 g 0    Sig: Apply 1 Application topically 2 (two) times daily.

## 2023-06-09 ENCOUNTER — Encounter: Payer: Self-pay | Admitting: Nurse Practitioner

## 2023-06-12 ENCOUNTER — Other Ambulatory Visit (HOSPITAL_BASED_OUTPATIENT_CLINIC_OR_DEPARTMENT_OTHER): Payer: Self-pay

## 2023-07-11 ENCOUNTER — Telehealth: Admitting: Nurse Practitioner

## 2023-07-11 DIAGNOSIS — R6889 Other general symptoms and signs: Secondary | ICD-10-CM | POA: Diagnosis not present

## 2023-07-11 MED ORDER — OSELTAMIVIR PHOSPHATE 75 MG PO CAPS: 75.0000 mg | ORAL_CAPSULE | Freq: Two times a day (BID) | ORAL | 0 refills | Status: AC

## 2023-07-11 MED ORDER — BENZONATATE 200 MG PO CAPS: 200.0000 mg | ORAL_CAPSULE | Freq: Two times a day (BID) | ORAL | 0 refills | Status: DC | PRN

## 2023-07-11 MED ORDER — PROMETHAZINE-DM 6.25-15 MG/5ML PO SYRP: 5.0000 mL | ORAL_SOLUTION | Freq: Four times a day (QID) | ORAL | 0 refills | Status: DC | PRN

## 2023-07-11 NOTE — Progress Notes (Signed)
 Virtual Visit Consent   Christina Valenzuela, you are scheduled for a virtual visit with a [facility] provider today. Just as with appointments in the office, your consent must be obtained to participate. Your consent will be active for this visit and any virtual visit you may have with one of our providers in the next 365 days. If you have a MyChart account, a copy of this consent can be sent to you electronically.  As this is a virtual visit, video technology does not allow for your provider to perform a traditional examination. This may limit your provider's ability to fully assess your condition. If your provider identifies any concerns that need to be evaluated in person or the need to arrange testing (such as labs, EKG, etc.), we will make arrangements to do so. Although advances in technology are sophisticated, we cannot ensure that it will always work on either your end or our end. If the connection with a video visit is poor, the visit may have to be switched to a telephone visit. With either a video or telephone visit, we are not always able to ensure that we have a secure connection.  By engaging in this virtual visit, you consent to the provision of healthcare and authorize for your insurance to be billed (if applicable) for the services provided during this visit. Depending on your insurance coverage, you may receive a charge related to this service.  I need to obtain your verbal consent now. Are you willing to proceed with your visit today? Christina Valenzuela has provided verbal consent on 07/11/2023 for a virtual visit (video or telephone). Claiborne Rigg, NP  Date: 07/11/2023 9:12 AM   Virtual Visit via Video Note   I, Claiborne Rigg, connected with  Christina Valenzuela  ([id number], 27-Mar-1976) on 07/11/23 at  9:15 AM EST by a video-enabled telemedicine application and verified that I am speaking with the correct person using two identifiers.  Location: Patient: Virtual Visit Location  Patient: Home Provider: Virtual Visit Location Provider: Home Office   I discussed the limitations of evaluation and management by telemedicine and the availability of in person appointments. The patient expressed understanding and agreed to proceed.    History of Present Illness: Christina Valenzuela is a 48 y.o. who identifies as a female who was assigned female at birth, and is being seen today for flu like symptoms.  Ms. Muecke recently found out her coworker was diagnosed with the flu and now she has been experiencing the following symptoms since yesterday: coughing, body aches, nausea, headache, runny nose and nasal congestion. Currently taking theraflu.    Problems:  Patient Active Problem List   Diagnosis Date Noted   Obesity (BMI 30.0-34.9) 02/27/2023   Hypertension 02/27/2023   Polyuria 02/27/2023   Mild obstructive sleep apnea 11/28/2022   Snoring 10/21/2022   Enlarged lymph node 01/17/2020   Capsulitis of right shoulder 08/24/2019   Trigger ring finger of right hand 08/24/2019   Effusion of both knee joints 07/13/2019   Cervical radiculopathy 07/13/2019   Pelvic pain 07/29/2018   Acute on chronic cholecystitis s/p lap cholecystectomy 01/05/2017 01/05/2017   MRSA (methicillin resistant Staphylococcus aureus) colonization 01/05/2017   Mixed incontinence 10/24/2016   Chronic anemia 09/08/2015   Pica 09/08/2015   Primary insomnia 08/22/2015   Depression with anxiety 08/22/2015   Alcohol abuse 08/22/2015   Headache 08/22/2015   UTI symptoms 02/16/2014   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    Allergies:  Allergies  Allergen  Reactions   Hydrocodone-Acetaminophen Itching   Latex Swelling    Vaginal swelling   Medications:  Current Outpatient Medications:    benzonatate (TESSALON) 200 MG capsule, Take 1 capsule (200 mg total) by mouth 2 (two) times daily as needed for cough., Disp: 20 capsule, Rfl: 0   oseltamivir (TAMIFLU) 75 MG capsule, Take 1 capsule (75 mg total)  by mouth 2 (two) times daily for 5 days., Disp: 10 capsule, Rfl: 0   promethazine-dextromethorphan (PROMETHAZINE-DM) 6.25-15 MG/5ML syrup, Take 5 mLs by mouth 4 (four) times daily as needed for cough., Disp: 240 mL, Rfl: 0   amLODipine (NORVASC) 5 MG tablet, Take 1 tablet (5 mg total) by mouth daily., Disp: 90 tablet, Rfl: 3   fluconazole (DIFLUCAN) 150 MG tablet, Take 1 tablet (150 mg total) by mouth once. Repeat in 3 days if symptoms persist., Disp: 1 tablet, Rfl: 1   ibuprofen (ADVIL) 800 MG tablet, Take 1 tablet (800 mg total) by mouth every 8 (eight) hours as needed., Disp: 30 tablet, Rfl: 0   metroNIDAZOLE (NUVESSA) 1.3 % GEL, Place 1 applicator vaginally at bedtime., Disp: 5 g, Rfl: 3   nitrofurantoin, macrocrystal-monohydrate, (MACROBID) 100 MG capsule, Take 1 capsule (100 mg total) by mouth 2 (two) times daily., Disp: 14 capsule, Rfl: 1   ramelteon (ROZEREM) 8 MG tablet, Take 1 tablet (8 mg total) by mouth at bedtime as needed for sleep., Disp: 30 tablet, Rfl: 5   triamcinolone cream (KENALOG) 0.1 %, Apply 1 Application topically 2 (two) times daily., Disp: 454 g, Rfl: 0   valACYclovir (VALTREX) 500 MG tablet, Take by mouth., Disp: , Rfl:    valACYclovir (VALTREX) 500 MG tablet, Take 1 tablet (500 mg total) by mouth 2 (two) times daily., Disp: 6 tablet, Rfl: 11  Observations/Objective: Patient is well-developed, well-nourished in no acute distress.  Resting comfortably at home.  Head is normocephalic, atraumatic.  No labored breathing.  Speech is clear and coherent with logical content.  Patient is alert and oriented at baseline.    Assessment and Plan: 1. Flu-like symptoms (Primary) - oseltamivir (TAMIFLU) 75 MG capsule; Take 1 capsule (75 mg total) by mouth 2 (two) times daily for 5 days.  Dispense: 10 capsule; Refill: 0 - promethazine-dextromethorphan (PROMETHAZINE-DM) 6.25-15 MG/5ML syrup; Take 5 mLs by mouth 4 (four) times daily as needed for cough.  Dispense: 240 mL; Refill:  0 - benzonatate (TESSALON) 200 MG capsule; Take 1 capsule (200 mg total) by mouth 2 (two) times daily as needed for cough.  Dispense: 20 capsule; Refill: 0  INSTRUCTIONS: use a humidifier for nasal congestion Drink plenty of fluids, rest and wash hands frequently to avoid the spread of infection Alternate tylenol and Motrin for relief of fever   Follow Up Instructions: I discussed the assessment and treatment plan with the patient. The patient was provided an opportunity to ask questions and all were answered. The patient agreed with the plan and demonstrated an understanding of the instructions.  A copy of instructions were sent to the patient via MyChart unless otherwise noted below.    The patient was advised to call back or seek an in-person evaluation if the symptoms worsen or if the condition fails to improve as anticipated.    Claiborne Rigg, NP

## 2023-07-11 NOTE — Patient Instructions (Signed)
 Christina Valenzuela, thank you for joining Claiborne Rigg, NP for today's virtual visit.  While this provider is not your primary care provider (PCP), if your PCP is located in our provider database this encounter information will be shared with them immediately following your visit.   A [facility] MyChart account gives you access to today's visit and all your visits, tests, and labs performed at [facility] " click here if you don't have a [facility] MyChart account or go to [email or web address]  Consent: (Patient) Christina Valenzuela provided verbal consent for this virtual visit at the beginning of the encounter.  Current Medications:  Current Outpatient Medications:    benzonatate (TESSALON) 200 MG capsule, Take 1 capsule (200 mg total) by mouth 2 (two) times daily as needed for cough., Disp: 20 capsule, Rfl: 0   oseltamivir (TAMIFLU) 75 MG capsule, Take 1 capsule (75 mg total) by mouth 2 (two) times daily for 5 days., Disp: 10 capsule, Rfl: 0   promethazine-dextromethorphan (PROMETHAZINE-DM) 6.25-15 MG/5ML syrup, Take 5 mLs by mouth 4 (four) times daily as needed for cough., Disp: 240 mL, Rfl: 0   amLODipine (NORVASC) 5 MG tablet, Take 1 tablet (5 mg total) by mouth daily., Disp: 90 tablet, Rfl: 3   fluconazole (DIFLUCAN) 150 MG tablet, Take 1 tablet (150 mg total) by mouth once. Repeat in 3 days if symptoms persist., Disp: 1 tablet, Rfl: 1   ibuprofen (ADVIL) 800 MG tablet, Take 1 tablet (800 mg total) by mouth every 8 (eight) hours as needed., Disp: 30 tablet, Rfl: 0   metroNIDAZOLE (NUVESSA) 1.3 % GEL, Place 1 applicator vaginally at bedtime., Disp: 5 g, Rfl: 3   nitrofurantoin, macrocrystal-monohydrate, (MACROBID) 100 MG capsule, Take 1 capsule (100 mg total) by mouth 2 (two) times daily., Disp: 14 capsule, Rfl: 1   ramelteon (ROZEREM) 8 MG tablet, Take 1 tablet (8 mg total) by mouth at bedtime as needed for sleep., Disp: 30 tablet, Rfl: 5   triamcinolone cream  (KENALOG) 0.1 %, Apply 1 Application topically 2 (two) times daily., Disp: 454 g, Rfl: 0   valACYclovir (VALTREX) 500 MG tablet, Take by mouth., Disp: , Rfl:    valACYclovir (VALTREX) 500 MG tablet, Take 1 tablet (500 mg total) by mouth 2 (two) times daily., Disp: 6 tablet, Rfl: 11   Medications ordered in this encounter:  Meds ordered this encounter  Medications   oseltamivir (TAMIFLU) 75 MG capsule    Sig: Take 1 capsule (75 mg total) by mouth 2 (two) times daily for 5 days.    Dispense:  10 capsule    Refill:  0    Supervising Provider:   Merrilee Jansky [[id number]]   promethazine-dextromethorphan (PROMETHAZINE-DM) 6.25-15 MG/5ML syrup    Sig: Take 5 mLs by mouth 4 (four) times daily as needed for cough.    Dispense:  240 mL    Refill:  0    Supervising Provider:   Merrilee Jansky [[id number]]   benzonatate (TESSALON) 200 MG capsule    Sig: Take 1 capsule (200 mg total) by mouth 2 (two) times daily as needed for cough.    Dispense:  20 capsule    Refill:  0    Supervising Provider:   Merrilee Jansky [[id number]]     *If you need refills on other medications prior to your next appointment, please contact your pharmacy*  Follow-Up: Call back or seek an in-person evaluation if the symptoms worsen or if the condition fails to improve  as anticipated.  [facility] [phone]  Other Instructions INSTRUCTIONS: use a humidifier for nasal congestion Drink plenty of fluids, rest and wash hands frequently to avoid the spread of infection Alternate tylenol and Motrin for relief of fever    If you have been instructed to have an in-person evaluation today at a local Urgent Care facility, please use the link below. It will take you to a list of all of our available [facility], including address, phone number and hours of operation. Please do not delay care.  [facility]  If you or a family member do not have a primary care provider, use the link  below to schedule a visit and establish care. When you choose a [facility] primary care physician or advanced practice provider, you gain a long-term partner in health. Find a Primary Care Provider  Learn more about [facility]'s in-office and virtual care options: [facility] - Get Care Now

## 2023-07-15 ENCOUNTER — Other Ambulatory Visit: Payer: Self-pay

## 2023-07-15 ENCOUNTER — Other Ambulatory Visit (HOSPITAL_BASED_OUTPATIENT_CLINIC_OR_DEPARTMENT_OTHER): Payer: Self-pay

## 2023-07-27 ENCOUNTER — Other Ambulatory Visit (HOSPITAL_BASED_OUTPATIENT_CLINIC_OR_DEPARTMENT_OTHER): Payer: Self-pay

## 2023-07-28 ENCOUNTER — Other Ambulatory Visit (HOSPITAL_BASED_OUTPATIENT_CLINIC_OR_DEPARTMENT_OTHER): Payer: Self-pay

## 2023-07-28 ENCOUNTER — Encounter: Payer: Self-pay | Admitting: Sports Medicine

## 2023-07-28 ENCOUNTER — Telehealth: Payer: Self-pay

## 2023-07-28 ENCOUNTER — Ambulatory Visit: Admitting: Sports Medicine

## 2023-07-28 DIAGNOSIS — M7502 Adhesive capsulitis of left shoulder: Secondary | ICD-10-CM | POA: Diagnosis not present

## 2023-07-28 MED ORDER — GABAPENTIN 300 MG PO CAPS
300.0000 mg | ORAL_CAPSULE | Freq: Three times a day (TID) | ORAL | 0 refills | Status: AC
  Filled 2023-07-28 – 2023-08-11 (×2): qty 90, 30d supply, fill #0

## 2023-07-28 MED ORDER — METHYLPREDNISOLONE ACETATE 40 MG/ML IJ SUSP
40.0000 mg | Freq: Once | INTRAMUSCULAR | Status: AC
  Administered 2023-07-28: 40 mg via INTRA_ARTICULAR

## 2023-07-28 MED ORDER — IBUPROFEN 800 MG PO TABS
800.0000 mg | ORAL_TABLET | Freq: Three times a day (TID) | ORAL | 0 refills | Status: AC | PRN
  Filled 2023-07-28 – 2023-08-11 (×2): qty 60, 20d supply, fill #0

## 2023-07-28 NOTE — Telephone Encounter (Signed)
 Spoke with patient regarding prior message . Patient has been scheduled with Florentina Addison for a f/u . Patient's voice was understanding.Nothing else further needed.

## 2023-07-28 NOTE — Telephone Encounter (Signed)
 She's due for follow up so needs appt to be scheduled.

## 2023-07-28 NOTE — Progress Notes (Signed)
   Subjective:    Patient ID: Christina Valenzuela, female    DOB: Sep 26, 1975, 48 y.o.   MRN: [id number]  HPI chief complaint: Left shoulder pain  Patient is a very pleasant right-hand-dominant female that presents today with 2 weeks of left shoulder pain.  She does not recall any injury or inciting event.  Pain is diffuse throughout the shoulder.  She has noticed some limited range of motion and pain while sleeping at night.  She has had problems with her right shoulder in the past which improved with cortisone injections and gabapentin.  She has recently restarted her gabapentin as well as 800 mg of ibuprofen.  These do help some but have not been curative.  No prior shoulder surgeries.  Past medical history reviewed Medications reviewed Allergies reviewed   Review of Systems As above    Objective:   Physical Exam  Well-developed, well-nourished.  No acute distress  Left shoulder: Limited active and passive range of motion including abduction and external rotation.  Diffuse tenderness to palpation.  No swelling.  Positive signs of impingement including positive empty can and positive Hawkins.  Rotator cuff strength is 5/5.      Assessment & Plan:   Left shoulder pain likely secondary to early adhesive capsulitis  Patient's left shoulder is injected today with cortisone.  She tolerates this without difficulty.  She will start pendulum swings and wall walks at home and I will refill her gabapentin with instructions to take 300 mg 3 times daily.  She will also take 800 mg ibuprofen as needed.  She tries to limit this to once a day.  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  She will follow-up for ongoing or recalcitrant issues.  This note was dictated using Dragon naturally speaking software and may contain errors in syntax, spelling, or content  which have not been identified prior to signing this note.

## 2023-08-07 ENCOUNTER — Other Ambulatory Visit (HOSPITAL_BASED_OUTPATIENT_CLINIC_OR_DEPARTMENT_OTHER): Payer: Self-pay

## 2023-08-11 ENCOUNTER — Other Ambulatory Visit (HOSPITAL_BASED_OUTPATIENT_CLINIC_OR_DEPARTMENT_OTHER): Payer: Self-pay

## 2023-08-17 ENCOUNTER — Encounter: Payer: Self-pay | Admitting: Family

## 2023-08-19 ENCOUNTER — Encounter: Payer: Self-pay | Admitting: Sports Medicine

## 2023-08-19 ENCOUNTER — Other Ambulatory Visit (HOSPITAL_BASED_OUTPATIENT_CLINIC_OR_DEPARTMENT_OTHER): Payer: Self-pay

## 2023-08-19 ENCOUNTER — Ambulatory Visit (HOSPITAL_BASED_OUTPATIENT_CLINIC_OR_DEPARTMENT_OTHER)
Admission: RE | Admit: 2023-08-19 | Discharge: 2023-08-19 | Disposition: A | Discharge status: Home / Self Care | Source: Ambulatory Visit | Attending: Sports Medicine | Admitting: Sports Medicine

## 2023-08-19 ENCOUNTER — Ambulatory Visit: Admitting: Sports Medicine

## 2023-08-19 VITALS — BP 114/80 | Ht 62.0 in | Wt 181.0 lb

## 2023-08-19 DIAGNOSIS — M25512 Pain in left shoulder: Secondary | ICD-10-CM

## 2023-08-19 DIAGNOSIS — M7502 Adhesive capsulitis of left shoulder: Secondary | ICD-10-CM | POA: Insufficient documentation

## 2023-08-19 MED ORDER — PREDNISONE 10 MG PO TABS
ORAL_TABLET | ORAL | 0 refills | Status: DC
Start: 2023-08-19 — End: 2023-08-28
  Filled 2023-08-19: qty 21, 6d supply, fill #0

## 2023-08-19 NOTE — Progress Notes (Signed)
   Subjective:    Patient ID: Christina Valenzuela, female    DOB: [birth year], 48 y.o.   MRN: [id number]  HPI  [place] presents today with worsening left shoulder pain.  Glenohumeral injection did not provide her with any pain relief.  Pain is diffuse throughout the shoulder and present with any sort of shoulder motion.  She does take 800 mg of ibuprofen but is worried about side effects.  Gabapentin has not been helpful nor have her home exercises.    Review of Systems As above    Objective:   Physical Exam  Left shoulder: Limited active and passive range of motion in all planes.  No tenderness to palpation.  She does have positive signs of impingement as well as pain with rotator cuff stressing.  Neurovascularly intact distally.  X-rays of the left shoulder including AP and lateral views show no significant glenohumeral or AC degenerative changes.    Assessment & Plan:   Persistent left shoulder pain secondary to adhesive capsulitis versus rotator cuff tear  Given the patient's persistent pain despite recent glenohumeral injection, gabapentin, and home exercises, I would like to proceed with an MRI to better differentiate between adhesive capsulitis and rotator cuff pathology.  I will MyChart message her with those results when available and we will delineate further treatment based on the findings.  In the meantime, she will trial a 6-day Sterapred Dosepak and she can wean off of her gabapentin.  This note was dictated using Dragon naturally speaking software and may contain errors in syntax, spelling, or content which have not been identified prior to signing this note.

## 2023-08-19 NOTE — Telephone Encounter (Signed)
 Spoke with pt, pt stated she only wants to see PCP. Scheduled pt a OV on 08/28/2023.

## 2023-08-28 ENCOUNTER — Other Ambulatory Visit (HOSPITAL_BASED_OUTPATIENT_CLINIC_OR_DEPARTMENT_OTHER): Payer: Self-pay

## 2023-08-28 ENCOUNTER — Ambulatory Visit: Admitting: Family

## 2023-08-28 ENCOUNTER — Ambulatory Visit: Payer: 59 | Admitting: Nurse Practitioner

## 2023-08-28 ENCOUNTER — Encounter: Payer: Self-pay | Admitting: Family

## 2023-08-28 ENCOUNTER — Other Ambulatory Visit: Payer: Self-pay

## 2023-08-28 VITALS — BP 138/86 | HR 109 | Ht 62.0 in | Wt 186.4 lb

## 2023-08-28 DIAGNOSIS — R519 Headache, unspecified: Secondary | ICD-10-CM

## 2023-08-28 DIAGNOSIS — R350 Frequency of micturition: Secondary | ICD-10-CM | POA: Diagnosis not present

## 2023-08-28 DIAGNOSIS — L853 Xerosis cutis: Secondary | ICD-10-CM

## 2023-08-28 DIAGNOSIS — I10 Essential (primary) hypertension: Secondary | ICD-10-CM | POA: Diagnosis not present

## 2023-08-28 MED ORDER — PREDNISONE 20 MG PO TABS
20.0000 mg | ORAL_TABLET | Freq: Every day | ORAL | 0 refills | Status: AC
  Filled 2023-08-28: qty 5, 5d supply, fill #0

## 2023-08-28 MED ORDER — AMLODIPINE BESYLATE-VALSARTAN 5-160 MG PO TABS
1.0000 | ORAL_TABLET | Freq: Every day | ORAL | 0 refills | Status: DC
  Filled 2023-08-28: qty 5, 5d supply, fill #0
  Filled 2023-08-28: qty 85, 85d supply, fill #0

## 2023-08-28 NOTE — Progress Notes (Signed)
 Christina Valenzuela is a 48 y.o. female with the following history as recorded in EpicCare:  Patient Active Problem List   Diagnosis Date Noted   Obesity (BMI 30.0-34.9) 02/27/2023   Hypertension 02/27/2023   Polyuria 02/27/2023   Mild obstructive sleep apnea 11/28/2022   Snoring 10/21/2022   Enlarged lymph node 01/17/2020   Capsulitis of right shoulder 08/24/2019   Trigger ring finger of right hand 08/24/2019   Effusion of both knee joints 07/13/2019   Cervical radiculopathy 07/13/2019   Pelvic pain 07/29/2018   Acute on chronic cholecystitis s/p lap cholecystectomy 01/05/2017 01/05/2017   MRSA (methicillin resistant Staphylococcus aureus) colonization 01/05/2017   Mixed incontinence 10/24/2016   Chronic anemia 09/08/2015   Pica 09/08/2015   Primary insomnia 08/22/2015   Depression with anxiety 08/22/2015   Alcohol abuse 08/22/2015   Headache 08/22/2015   UTI symptoms 02/16/2014   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    Current Outpatient Medications  Medication Sig Dispense Refill   amLODipine -valsartan  (EXFORGE ) 5-160 MG tablet Take 1 tablet by mouth daily. 90 tablet 0   fluconazole  (DIFLUCAN ) 150 MG tablet Take 1 tablet (150 mg total) by mouth once. Repeat in 3 days if symptoms persist. 1 tablet 1   gabapentin  (NEURONTIN ) 300 MG capsule Take 1 capsule (300 mg total) by mouth 3 (three) times daily. 90 capsule 0   ibuprofen  (ADVIL ) 800 MG tablet Take 1 tablet (800 mg total) by mouth every 8 (eight) hours as needed. 60 tablet 0   metroNIDAZOLE  (NUVESSA ) 1.3 % GEL Place 1 applicator vaginally at bedtime. 5 g 3   predniSONE  (DELTASONE ) 20 MG tablet Take 1 tablet (20 mg total) by mouth daily with breakfast. 5 tablet 0   ramelteon  (ROZEREM ) 8 MG tablet Take 1 tablet (8 mg total) by mouth at bedtime as needed for sleep. 30 tablet 5   triamcinolone  cream (KENALOG ) 0.1 % Apply 1 Application topically 2 (two) times daily. 454 g 0   valACYclovir  (VALTREX ) 500 MG tablet Take by mouth.      valACYclovir  (VALTREX ) 500 MG tablet Take 1 tablet (500 mg total) by mouth 2 (two) times daily. 6 tablet 11   No current facility-administered medications for this visit.    Allergies: Hydrocodone -acetaminophen  and Latex  Past Medical History:  Diagnosis Date   Depression     Past Surgical History:  Procedure Laterality Date   ABDOMINAL HYSTERECTOMY  11/28/2016   [facility] - Dr Augustus Ledger.  UTERINE LEIOMYOMA w/ MENORRHAGIA   CESAREAN SECTION     CESAREAN SECTION     CHOLECYSTECTOMY N/A 01/05/2017   Procedure: LAPAROSCOPIC CHOLECYSTECTOMY WITH INTRAOPERATIVE CHOLANGIOGRAM;  Surgeon: Candyce Champagne, MD;  Location: [facility];  Service: General;  Laterality: N/A;   EPIGASTRIC HERNIA REPAIR     with mesh.  Dr Alray Askew   TUBAL LIGATION      Family History  Problem Relation Age of Onset   Diabetes Maternal Grandfather     Social History   Tobacco Use   Smoking status: Former    Current packs/day: 0.00    Types: Cigarettes    Quit date: 04/21/2022    Years since quitting: 1.3   Smokeless tobacco: Never  Substance Use Topics   Alcohol use: Not Currently    Subjective:   [facility] with migraine headache; notes that feeling better today- had been off her blood pressure medication for the past few days when seen at [facility]; admits that stress level high at work; has felt that her blood  pressure has been up recently; Denies any chest pain, shortness of breath, blurred vision or headache.   Objective:  Vitals:   08/28/23 0804  BP: 138/86  Pulse: (!) 109  SpO2: 98%  Weight: 186 lb 7.2 oz (84.6 kg)  Height: 5\' 2"  (1.575 m)    General: Well developed, well nourished, in no acute distress  Skin : Warm and dry.  Head: Normocephalic and atraumatic  Eyes: Sclera and conjunctiva clear; pupils round and reactive to light; extraocular movements intact  Ears: External normal; canals clear; tympanic membranes normal  Oropharynx: Pink, supple. No suspicious lesions  Neck: Supple without  thyromegaly, adenopathy  Lungs: Respirations unlabored; clear to auscultation bilaterally without wheeze, rales, rhonchi  CVS exam: normal rate and regular rhythm.  Neurologic: Alert and oriented; speech intact; face symmetrical; moves all extremities well; CNII-XII intact without focal deficit   Assessment:  1. Essential hypertension   2. Nonintractable headache, unspecified chronicity pattern, unspecified headache type   3. Dry skin dermatitis   4. Urinary frequency     Plan:  Suspect headaches secondary to blood pressure not being well controlled; discussed trial of hydrochlorothiazide but patient concerned about urinary side effects; will change to Exforge  10/160 mg; follow up in 2 weeks; 3.   Encouraged to try changing to a detergent for sensitive skin; prednisone  20 mg x 5 days; refer to dermatology due to persisting and worsening skin changes; 4.   Check urine culture;   Return in about 2 weeks (around 09/11/2023).  Orders Placed This Encounter  Procedures   Urine Culture   Ambulatory referral to Dermatology    Referral Priority:   Routine    Referral Type:   Consultation    Referral Reason:   Specialty Services Required    Requested Specialty:   Dermatology    Number of Visits Requested:   1    Requested Prescriptions   Signed Prescriptions Disp Refills   amLODipine -valsartan  (EXFORGE ) 5-160 MG tablet 90 tablet 0    Sig: Take 1 tablet by mouth daily.   predniSONE  (DELTASONE ) 20 MG tablet 5 tablet 0    Sig: Take 1 tablet (20 mg total) by mouth daily with breakfast.

## 2023-08-29 LAB — URINE CULTURE
MICRO NUMBER:: 16376593
Result:: NO GROWTH
SPECIMEN QUALITY:: ADEQUATE

## 2023-08-30 ENCOUNTER — Ambulatory Visit (HOSPITAL_BASED_OUTPATIENT_CLINIC_OR_DEPARTMENT_OTHER)
Admission: RE | Admit: 2023-08-30 | Discharge: 2023-08-30 | Disposition: A | Discharge status: Home / Self Care | Source: Ambulatory Visit | Attending: Sports Medicine | Admitting: Sports Medicine

## 2023-08-30 DIAGNOSIS — M7502 Adhesive capsulitis of left shoulder: Secondary | ICD-10-CM | POA: Insufficient documentation

## 2023-08-31 ENCOUNTER — Other Ambulatory Visit (HOSPITAL_BASED_OUTPATIENT_CLINIC_OR_DEPARTMENT_OTHER): Payer: Self-pay

## 2023-08-31 ENCOUNTER — Other Ambulatory Visit: Payer: Self-pay

## 2023-08-31 ENCOUNTER — Encounter: Payer: Self-pay | Admitting: Family

## 2023-09-02 ENCOUNTER — Encounter: Payer: Self-pay | Admitting: Sports Medicine

## 2023-09-03 ENCOUNTER — Other Ambulatory Visit: Payer: Self-pay | Admitting: *Deleted

## 2023-09-03 DIAGNOSIS — S43432A Superior glenoid labrum lesion of left shoulder, initial encounter: Secondary | ICD-10-CM

## 2023-09-10 ENCOUNTER — Ambulatory Visit: Admitting: Nurse Practitioner

## 2023-09-10 ENCOUNTER — Encounter: Payer: Self-pay | Admitting: Nurse Practitioner

## 2023-09-10 ENCOUNTER — Other Ambulatory Visit (HOSPITAL_BASED_OUTPATIENT_CLINIC_OR_DEPARTMENT_OTHER): Payer: Self-pay

## 2023-09-10 VITALS — BP 136/90 | HR 121 | Ht 62.0 in | Wt 189.0 lb

## 2023-09-10 DIAGNOSIS — E66811 Obesity, class 1: Secondary | ICD-10-CM | POA: Diagnosis not present

## 2023-09-10 DIAGNOSIS — F5101 Primary insomnia: Secondary | ICD-10-CM

## 2023-09-10 DIAGNOSIS — G4733 Obstructive sleep apnea (adult) (pediatric): Secondary | ICD-10-CM | POA: Diagnosis not present

## 2023-09-10 NOTE — Assessment & Plan Note (Signed)
 Ongoing insomniac symptoms. See above

## 2023-09-10 NOTE — Patient Instructions (Addendum)
 Start CPAP auto 5-15 cmH2O, mask of choice and heated humidity, every night, minimum of 4-6 hours a night.  Change equipment as directed. Wash your tubing with warm soap and water  daily, hang to dry. Wash humidifier portion weekly. Use bottled, distilled water  and change daily Be aware of reduced alertness and do not drive or operate heavy machinery if experiencing this or drowsiness.  Exercise encouraged, as tolerated. Healthy weight management discussed.  Avoid or decrease alcohol consumption and medications that make you more sleepy, if possible. Notify if persistent daytime sleepiness occurs even with consistent use of PAP therapy.  We discussed how untreated sleep apnea puts an individual at risk for cardiac arrhthymias, pulm HTN, DM, stroke and increases their risk for daytime accidents.   Change supplies... Every month Mask cushions and/or nasal pillows CPAP machine filters Every 3 months Mask frame (not including the headgear) CPAP tubing Every 6 months Mask headgear Chin strap (if applicable) Humidifier water  tub  Follow up in 10-12 weeks with Katie Lerone Onder,NP to see how CPAP is going, or sooner, if needed

## 2023-09-10 NOTE — Progress Notes (Signed)
 @Patient  ID: Christina Valenzuela, female    DOB: 1975/11/17, 48 y.o.   MRN: [id number]  Chief Complaint  Patient presents with   Follow-up    Patient states rozerm is not working.    Referring provider: Adra Alanis,*  HPI: 48 year old female, former smoker followed for chronic insomnia and mild OSA. Past medical history significant for cervical radiculopathy, depression with anxiety, ETOH abuse, HTN.   TEST/EVENTS:  10/21/2022 NPSG: AHI 9.8/h, SpO2 low 86%  07/23/2022: OV with Dr. Gaynell Keeler. Difficulties falling asleep. Multiple night awakenings. If she doesn't take any medications, it can take up to 2-3 hours for her to fall asleep. Doesn't feel restored. Choking episodes at night. Has bee told she snores. She has gained weight since she quit smoking. Occasional migraine headaches. On trazodone  100 mg. In lab split night sleep study.  11/28/2022: Ov with Lindi Abram NP for follow up to discuss home sleep study results, which revealed mild sleep apnea with AHI 9.8/h. She wants to discuss her treatment options today. She feels unchanged compared to her last visit. Her main concern is her inability to fall asleep at night. She feels tired the next day. Trazodone  isn't working well for her anymore. She denies drowsy driving or sleep parasomnias/paralysis.  She also is concerned about her left ear feeling full. Sounds are muffled. She denies any pain, drainage, fevers, sinus symptoms. Right ear feels fine and no hearing changes   02/27/2023: OV with Cheresa Siers NP for follow up. After our last visit, she started the ramelteon . This worked great for her. She felt like she hadn't slept that restfully in years. Woke up actually feeling refreshed. It did not make her groggy in the morning. She ran out of this so she hasn't been using it recently. She didn't realize to call for a refill. She would like to go back on it. She denies any drowsy driving, morning headaches, sleep parasomnias/paralysis.  She's  been having some issues with her blood pressure recently. She noticed it was high at her GYN appt last month. It was elevated today. She thinks it might be related to stress at her job. She has been having some problems with migraine headaches, and is now concerned it's related to her BP. She's not been checking it at home. Denies chest pain, vision changes, palpitations, leg swelling, orthopnea, PND.  She's also been having issues with frequent urination at night. She does tend to urinate a lot during the day as well. She tries to avoid drinking much before bed. She is thirsty a lot of the time. She has been told she has prediabetes before but has not had any recent testing. No unintentional weight loss, recent infections, vision changes, dizziness.   09/10/2023: Today - follow up Patient presents today for follow up. She has ongoing issues with her sleep. She still feels tired during the day and urinating frequently at night. She has had to made further adjustments to her BP regimen with her PCP. Prior sleep aids didn't make much of a difference. She'd like to discuss alternative options.   Allergies  Allergen Reactions   Hydrocodone -Acetaminophen  Itching   Latex Swelling    Vaginal swelling    Immunization History  Administered Date(s) Administered   Influenza,inj,Quad PF,6+ Mos 04/09/2016   PFIZER(Purple Top)SARS-COV-2 Vaccination 11/07/2019   Pneumococcal Polysaccharide-23 08/22/2015    Past Medical History:  Diagnosis Date   Depression     Tobacco History: Social History   Tobacco Use  Smoking Status  Former   Current packs/day: 0.00   Types: Cigarettes   Quit date: 04/21/2022   Years since quitting: 1.3  Smokeless Tobacco Never   Counseling given: Not Answered   Outpatient Medications Prior to Visit  Medication Sig Dispense Refill   amLODipine -valsartan  (EXFORGE ) 5-160 MG tablet Take 1 tablet by mouth daily. 90 tablet 0   fluconazole  (DIFLUCAN ) 150 MG tablet Take 1  tablet (150 mg total) by mouth once. Repeat in 3 days if symptoms persist. 1 tablet 1   gabapentin  (NEURONTIN ) 300 MG capsule Take 1 capsule (300 mg total) by mouth 3 (three) times daily. 90 capsule 0   ibuprofen  (ADVIL ) 800 MG tablet Take 1 tablet (800 mg total) by mouth every 8 (eight) hours as needed. 60 tablet 0   metroNIDAZOLE  (NUVESSA ) 1.3 % GEL Place 1 applicator vaginally at bedtime. 5 g 3   predniSONE  (DELTASONE ) 20 MG tablet Take 1 tablet (20 mg total) by mouth daily with breakfast. 5 tablet 0   ramelteon  (ROZEREM ) 8 MG tablet Take 1 tablet (8 mg total) by mouth at bedtime as needed for sleep. 30 tablet 5   triamcinolone  cream (KENALOG ) 0.1 % Apply 1 Application topically 2 (two) times daily. 454 g 0   valACYclovir  (VALTREX ) 500 MG tablet Take by mouth.     valACYclovir  (VALTREX ) 500 MG tablet Take 1 tablet (500 mg total) by mouth 2 (two) times daily. 6 tablet 11   No facility-administered medications prior to visit.     Review of Systems:   Constitutional: No weight loss or gain, night sweats, fevers, chills, or lassitude. +fatigue HEENT: No difficulty swallowing, tooth/dental problems, or sore throat. No sneezing, itching, ear ache, nasal congestion, or post nasal drip. +headaches CV:  No chest pain, orthopnea, PND, swelling in lower extremities, anasarca, dizziness, palpitations, syncope Resp: No shortness of breath with exertion or at rest. No excess mucus or change in color of mucus. No productive or non-productive. No hemoptysis. No wheezing.  No chest wall deformity GI:  No heartburn, indigestion GU: No dysuria  Skin: No rash, lesions, ulcerations MSK:  No joint pain or swelling. Endocrine: +polyuria, polydipsia  Neuro: No dizziness or lightheadedness.  Psych: No depression or anxiety. Mood stable. +sleep disturbance    Physical Exam:  BP (!) 136/90 (BP Location: Left Arm, Patient Position: Sitting)   Pulse (!) 121   Ht 5\' 2"  (1.575 m)   Wt 189 lb (85.7 kg)   LMP  08/31/2016   SpO2 97%   BMI 34.57 kg/m   GEN: Pleasant, interactive, well-appearing; obese; in no acute distress HEENT:  Normocephalic and atraumatic. PERRLA. Sclera white. Nasal turbinates pink, moist and patent bilaterally. No rhinorrhea present. Oropharynx pink and moist, without exudate or edema. No lesions, ulcerations, or postnasal drip.  NECK:  Supple w/ fair ROM. No lymphadenopathy.   CV: RRR, no m/r/g, no peripheral edema. Pulses intact, +2 bilaterally. No cyanosis, pallor or clubbing. PULMONARY:  Unlabored, regular breathing. Clear bilaterally A&P w/o wheezes/rales/rhonchi. No accessory muscle use.  GI: BS present and normoactive. Soft, non-tender to palpation. No organomegaly or masses detected.  MSK: No erythema, warmth or tenderness. Cap refil <2 sec all extrem. No deformities or joint swelling noted.  Neuro: A/Ox3. No focal deficits noted.   Skin: Warm, no lesions or rashe Psych: Normal affect and behavior. Judgement and thought content appropriate.     Lab Results:  CBC    Component Value Date/Time   WBC 8.0 06/04/2023 0854   RBC 4.98 06/04/2023  0854   HGB 13.5 06/04/2023 0854   HCT 41.8 06/04/2023 0854   PLT 329.0 06/04/2023 0854   MCV 83.8 06/04/2023 0854   MCH 22.9 (L) 12/01/2018 1528   MCHC 32.2 06/04/2023 0854   RDW 13.8 06/04/2023 0854   LYMPHSABS 2.4 06/04/2023 0854   MONOABS 0.5 06/04/2023 0854   EOSABS 0.2 06/04/2023 0854   BASOSABS 0.1 06/04/2023 0854    BMET    Component Value Date/Time   NA 135 06/04/2023 0854   K 4.2 06/04/2023 0854   CL 101 06/04/2023 0854   CO2 27 06/04/2023 0854   GLUCOSE 85 06/04/2023 0854   BUN 11 06/04/2023 0854   CREATININE 0.80 06/04/2023 0854   CREATININE 0.73 05/30/2015 1600   CALCIUM 9.5 06/04/2023 0854   GFRNONAA >60 12/01/2018 1528   GFRAA >60 12/01/2018 1528    BNP No results found for: "BNP"   Imaging:  MR SHOULDER LEFT WO CONTRAST Result Date: 09/01/2023 CLINICAL DATA:  Left shoulder and arm  pain. Evaluate for rotator cuff tear. No prior left shoulder surgery. EXAM: MRI OF THE LEFT SHOULDER WITHOUT CONTRAST TECHNIQUE: Multiplanar, multisequence MR imaging of the shoulder was performed. No intravenous contrast was administered. COMPARISON:  Left shoulder radiograph 08/19/2023 FINDINGS: Despite efforts by the technologist and patient, mild motion artifact is present on today's exam and could not be eliminated. This reduces exam sensitivity and specificity. Rotator cuff: Mild anterior supraspinatus intermediate T2 signal tendinosis (sagittal series 7, images 12 and 13, coronal series 5 images 11 and 12). The infraspinatus, subscapularis, and teres minor are intact. Muscles: No rotator cuff muscle atrophy, fatty infiltration, or edema. Biceps long head: The intra-articular long head of the biceps tendon is intact. Acromioclavicular Joint: Normal variant persistent os acromiale. Minimal marrow edema across the synchondrosis. Normal alignment of the acromioclavicular joint without significant degenerative change. Trace fluid within the subacromial/subdeltoid bursa. Glenohumeral Joint: Mild mid glenoid cartilage thinning. Mild surface irregularity of the superomedial aspect of the humeral head cartilage. Small joint effusion. Labrum: There is linear increased T2 signal at the anteroinferior chondrolabral junction at the 8-9 o'clock location (axial series 4 images tendon 11) with a likely multilobulated paralabral cyst extending anteriorly and inferiorly and measuring up to approximately 17 x 10 x 20 mm (transverse by AP by craniocaudal, sagittal series 7, image 3 and axial series 4 images 12 through 14). This likely paralabral cyst extends deep to the subscapularis muscle. Bones:  No acute fracture. Other: None. IMPRESSION: 1. Mild anterior supraspinatus tendinosis. No rotator cuff tear. 2. Normal variant persistent os acromiale. Minimal marrow edema across the synchondrosis. 3. Mild glenohumeral cartilage  degenerative changes. 4. Anteroinferior chondrolabral junction tear at the 8-9 o'clock location with a likely multilobulated paralabral cyst extending anteriorly and inferiorly and measuring up to approximately 17 x 10 x 20 mm. Electronically Signed   By: Bertina Broccoli M.D.   On: 09/01/2023 19:46   DG Shoulder Left Result Date: 08/19/2023 CLINICAL DATA:  left shoulder pain. EXAM: LEFT SHOULDER - 2+ VIEW COMPARISON:  None Available. FINDINGS: No acute fracture or dislocation. No aggressive osseous lesion. Glenohumeral and acromioclavicular joints are normal in alignment. No significant degenerative changes. No soft tissue swelling. No radiopaque foreign bodies. IMPRESSION: *No acute osseous abnormality of the left shoulder. Electronically Signed   By: Beula Brunswick M.D.   On: 08/19/2023 11:43    methylPREDNISolone  acetate (DEPO-MEDROL ) injection 40 mg     Date Action Dose Route User   07/28/2023 1000 Given 40 mg  Intra-articular Claybon Cuna, CMA           No data to display          No results found for: "NITRICOXIDE"      Assessment & Plan:   Mild obstructive sleep apnea Mild OSA with significant symptom burden. Reviewed treatment options. Given ongoing difficulties, shared decision to start CPAP therapy auto 5-15 cmH2O, mask of choice and heated humidity. Educated on proper use/care of device. Risks/benefits reviewed. Healthy weight loss encouraged. Safe driving practices reviewed. Will monitor response to treatment.  Patient Instructions  Start CPAP auto 5-15 cmH2O, mask of choice and heated humidity, every night, minimum of 4-6 hours a night.  Change equipment as directed. Wash your tubing with warm soap and water  daily, hang to dry. Wash humidifier portion weekly. Use bottled, distilled water  and change daily Be aware of reduced alertness and do not drive or operate heavy machinery if experiencing this or drowsiness.  Exercise encouraged, as tolerated. Healthy weight  management discussed.  Avoid or decrease alcohol consumption and medications that make you more sleepy, if possible. Notify if persistent daytime sleepiness occurs even with consistent use of PAP therapy.  We discussed how untreated sleep apnea puts an individual at risk for cardiac arrhthymias, pulm HTN, DM, stroke and increases their risk for daytime accidents.   Change supplies... Every month Mask cushions and/or nasal pillows CPAP machine filters Every 3 months Mask frame (not including the headgear) CPAP tubing Every 6 months Mask headgear Chin strap (if applicable) Humidifier water  tub  Follow up in 10-12 weeks with Katie Alta Shober,NP to see how CPAP is going, or sooner, if needed    Primary insomnia Ongoing insomniac symptoms. See above  Obesity (BMI 30.0-34.9) BMI 34. 10 lb weight gain since his prior sleep study. Healthy weight loss encouraged.      I spent 35 minutes of dedicated to the care of this patient on the date of this encounter to include pre-visit review of records, face-to-face time with the patient discussing conditions above, post visit ordering of testing, clinical documentation with the electronic health record, making appropriate referrals as documented, and communicating necessary findings to members of the patients care team.  Roetta Clarke, NP 09/10/2023  Pt aware and understands NP's role.

## 2023-09-10 NOTE — Assessment & Plan Note (Signed)
 Mild OSA with significant symptom burden. Reviewed treatment options. Given ongoing difficulties, shared decision to start CPAP therapy auto 5-15 cmH2O, mask of choice and heated humidity. Educated on proper use/care of device. Risks/benefits reviewed. Healthy weight loss encouraged. Safe driving practices reviewed. Will monitor response to treatment.  Patient Instructions  Start CPAP auto 5-15 cmH2O, mask of choice and heated humidity, every night, minimum of 4-6 hours a night.  Change equipment as directed. Wash your tubing with warm soap and water  daily, hang to dry. Wash humidifier portion weekly. Use bottled, distilled water  and change daily Be aware of reduced alertness and do not drive or operate heavy machinery if experiencing this or drowsiness.  Exercise encouraged, as tolerated. Healthy weight management discussed.  Avoid or decrease alcohol consumption and medications that make you more sleepy, if possible. Notify if persistent daytime sleepiness occurs even with consistent use of PAP therapy.  We discussed how untreated sleep apnea puts an individual at risk for cardiac arrhthymias, pulm HTN, DM, stroke and increases their risk for daytime accidents.   Change supplies... Every month Mask cushions and/or nasal pillows CPAP machine filters Every 3 months Mask frame (not including the headgear) CPAP tubing Every 6 months Mask headgear Chin strap (if applicable) Humidifier water  tub  Follow up in 10-12 weeks with Christina Simmie Camerer,NP to see how CPAP is going, or sooner, if needed

## 2023-09-10 NOTE — Assessment & Plan Note (Signed)
 BMI 34. 10 lb weight gain since his prior sleep study. Healthy weight loss encouraged.

## 2023-09-11 ENCOUNTER — Ambulatory Visit: Admitting: Family

## 2023-09-15 ENCOUNTER — Other Ambulatory Visit (HOSPITAL_BASED_OUTPATIENT_CLINIC_OR_DEPARTMENT_OTHER): Payer: Self-pay

## 2023-09-15 ENCOUNTER — Ambulatory Visit: Admitting: Family

## 2023-09-18 ENCOUNTER — Telehealth: Payer: Self-pay | Admitting: Student

## 2023-09-18 ENCOUNTER — Ambulatory Visit: Admitting: Family

## 2023-09-18 NOTE — Telephone Encounter (Signed)
 Rc'd fax for CPAP supplies. Will fwd to Ms. Cobb for signature.

## 2023-09-21 ENCOUNTER — Other Ambulatory Visit (HOSPITAL_BASED_OUTPATIENT_CLINIC_OR_DEPARTMENT_OTHER): Payer: Self-pay

## 2023-09-22 NOTE — Telephone Encounter (Signed)
 Rc'd signed CRM. Will fax to [phone] and wait for fax confirmation

## 2023-09-25 ENCOUNTER — Emergency Department (HOSPITAL_BASED_OUTPATIENT_CLINIC_OR_DEPARTMENT_OTHER)
Admission: EM | Admit: 2023-09-25 | Discharge: 2023-09-25 | Disposition: A | Discharge status: Home / Self Care | Attending: Emergency Medicine | Admitting: Emergency Medicine

## 2023-09-25 ENCOUNTER — Encounter (HOSPITAL_BASED_OUTPATIENT_CLINIC_OR_DEPARTMENT_OTHER): Payer: Self-pay

## 2023-09-25 ENCOUNTER — Other Ambulatory Visit: Payer: Self-pay

## 2023-09-25 DIAGNOSIS — R1084 Generalized abdominal pain: Secondary | ICD-10-CM | POA: Insufficient documentation

## 2023-09-25 DIAGNOSIS — R110 Nausea: Secondary | ICD-10-CM | POA: Diagnosis not present

## 2023-09-25 DIAGNOSIS — R1031 Right lower quadrant pain: Secondary | ICD-10-CM | POA: Diagnosis present

## 2023-09-25 LAB — CBC WITH DIFFERENTIAL/PLATELET
Abs Immature Granulocytes: 0.04 10*3/uL (ref 0.00–0.07)
Basophils Absolute: 0.1 10*3/uL (ref 0.0–0.1)
Basophils Relative: 1 %
Eosinophils Absolute: 0.1 10*3/uL (ref 0.0–0.5)
Eosinophils Relative: 1 %
HCT: 39.4 % (ref 36.0–46.0)
Hemoglobin: 13 g/dL (ref 12.0–15.0)
Immature Granulocytes: 0 %
Lymphocytes Relative: 37 %
Lymphs Abs: 3.9 10*3/uL (ref 0.7–4.0)
MCH: 27.3 pg (ref 26.0–34.0)
MCHC: 33 g/dL (ref 30.0–36.0)
MCV: 82.8 fL (ref 80.0–100.0)
Monocytes Absolute: 0.7 10*3/uL (ref 0.1–1.0)
Monocytes Relative: 6 %
Neutro Abs: 5.6 10*3/uL (ref 1.7–7.7)
Neutrophils Relative %: 55 %
Platelets: 273 10*3/uL (ref 150–400)
RBC: 4.76 MIL/uL (ref 3.87–5.11)
RDW: 14 % (ref 11.5–15.5)
WBC: 10.4 10*3/uL (ref 4.0–10.5)
nRBC: 0 % (ref 0.0–0.2)

## 2023-09-25 LAB — URINALYSIS, ROUTINE W REFLEX MICROSCOPIC
Bilirubin Urine: NEGATIVE
Glucose, UA: NEGATIVE mg/dL
Ketones, ur: NEGATIVE mg/dL
Leukocytes,Ua: NEGATIVE
Nitrite: NEGATIVE
Protein, ur: NEGATIVE mg/dL
Specific Gravity, Urine: >=1.03 (ref 1.005–1.030)
pH: 5.5 (ref 5.0–8.0)

## 2023-09-25 LAB — COMPREHENSIVE METABOLIC PANEL WITH GFR
ALT: 25 U/L (ref 0–44)
AST: 19 U/L (ref 15–41)
Albumin: 4.1 g/dL (ref 3.5–5.0)
Alkaline Phosphatase: 69 U/L (ref 38–126)
Anion gap: 13 (ref 5–15)
BUN: 11 mg/dL (ref 6–20)
CO2: 19 mmol/L — ABNORMAL LOW (ref 22–32)
Calcium: 9.8 mg/dL (ref 8.9–10.3)
Chloride: 106 mmol/L (ref 98–111)
Creatinine, Ser: 0.82 mg/dL (ref 0.44–1.00)
GFR, Estimated: >60 mL/min (ref >60)
Glucose, Bld: 103 mg/dL — ABNORMAL HIGH (ref 70–99)
Potassium: 3.7 mmol/L (ref 3.5–5.1)
Sodium: 138 mmol/L (ref 135–145)
Total Bilirubin: 0.3 mg/dL (ref 0.0–1.2)
Total Protein: 6.9 g/dL (ref 6.5–8.1)

## 2023-09-25 LAB — PREGNANCY, URINE: Preg Test, Ur: NEGATIVE

## 2023-09-25 LAB — URINALYSIS, MICROSCOPIC (REFLEX)

## 2023-09-25 MED ORDER — POLYETHYLENE GLYCOL 3350 17 G PO PACK: 17.0000 g | PACK | Freq: Once | ORAL | 0 refills | Status: AC

## 2023-09-25 MED ORDER — POLYETHYLENE GLYCOL 3350 17 G PO PACK
17.0000 g | PACK | Freq: Every day | ORAL | Status: DC
  Administered 2023-09-25: 17 g via ORAL
  Filled 2023-09-25: qty 1

## 2023-09-25 MED ORDER — KETOROLAC TROMETHAMINE 15 MG/ML IJ SOLN
30.0000 mg | Freq: Once | INTRAMUSCULAR | Status: AC
  Administered 2023-09-25: 30 mg via INTRAMUSCULAR
  Filled 2023-09-25: qty 2

## 2023-09-25 NOTE — ED Provider Notes (Signed)
 [facility] Provider Note   CSN: [id number] Arrival date & time: 09/25/23  0255     History Chief Complaint  Patient presents with   Abdominal Pain    HPI Christina Valenzuela is a 48 y.o. female presenting for right lower quadrant abdominal pain. Has been present for 2 weeks, intermittent, worse at night.  Patient states it is usually a feeling that she has to defecate and when she tries she has very bad pain.  Patient's recorded medical, surgical, social, medication list and allergies were reviewed in the Snapshot window as part of the initial history.   Review of Systems   Review of Systems  Constitutional:  Negative for chills and fever.  HENT:  Negative for ear pain and sore throat.   Eyes:  Negative for pain and visual disturbance.  Respiratory:  Negative for cough and shortness of breath.   Cardiovascular:  Negative for chest pain and palpitations.  Gastrointestinal:  Positive for abdominal pain and nausea. Negative for rectal pain and vomiting.  Genitourinary:  Negative for dysuria and hematuria.  Musculoskeletal:  Negative for arthralgias and back pain.  Skin:  Negative for color change and rash.  Neurological:  Negative for seizures and syncope.  All other systems reviewed and are negative.   Physical Exam Updated Vital Signs BP (!) 142/103   Pulse 94   Temp 98 F (36.7 C) (Oral)   Resp 18   Ht 5\' 2"  (1.575 m)   Wt 84.4 kg   LMP 08/31/2016   SpO2 96%   BMI 34.02 kg/m  Physical Exam Vitals and nursing note reviewed.  Constitutional:      General: She is not in acute distress.    Appearance: She is well-developed.  HENT:     Head: Normocephalic and atraumatic.  Eyes:     Conjunctiva/sclera: Conjunctivae normal.  Cardiovascular:     Rate and Rhythm: Normal rate and regular rhythm.     Heart sounds: No murmur heard. Pulmonary:     Effort: Pulmonary effort is normal. No respiratory distress.     Breath  sounds: Normal breath sounds.  Abdominal:     General: There is no distension.     Palpations: Abdomen is soft.     Tenderness: There is no abdominal tenderness. There is no right CVA tenderness or left CVA tenderness.  Musculoskeletal:        General: No swelling or tenderness. Normal range of motion.     Cervical back: Neck supple.  Skin:    General: Skin is warm and dry.  Neurological:     General: No focal deficit present.     Mental Status: She is alert and oriented to person, place, and time. Mental status is at baseline.     Cranial Nerves: No cranial nerve deficit.      ED Course/ Medical Decision Making/ A&P    Procedures Procedures   Medications Ordered in ED Medications  polyethylene glycol (MIRALAX  / GLYCOLAX ) packet 17 g (17 g Oral Given 09/25/23 0352)  ketorolac  (TORADOL ) 15 MG/ML injection 30 mg (30 mg Intramuscular Given 09/25/23 0352)   Medical Decision Making:   Christina Valenzuela is a 48 y.o. female who presented to the ED today with abdominal pain, detailed above.    Patient placed on continuous vitals and telemetry monitoring while in ED which was reviewed periodically.  Complete initial physical exam performed, notably the patient  was HDS in NAD.  Reviewed and confirmed nursing documentation for past medical history, family history, social history.    Initial Assessment:   With the patient's presentation of abdominal pain, most likely diagnosis is nonspecific etiology. Other diagnoses were considered including (but not limited to) gastroenteritis, colitis, small bowel obstruction, appendicitis, cholecystitis, pancreatitis, nephrolithiasis, UTI, pyleonephritis, ruptured ectopic pregnancy, PID, ovarian torsion. These are considered less likely due to history of present illness and physical exam findings.   This is most consistent with an acute life/limb threatening illness complicated by underlying chronic conditions.   Initial Plan:  CBC/CMP to evaluate  for underlying infectious/metabolic etiology for patient's abdominal pain  Lipase to evaluate for pancreatitis  Urinalysis and repeat physical assessment to evaluate for UTI/Pyelonpehritis  Empiric management of symptoms with escalating pain control and antiemetics as needed.   Initial Study Results:   Laboratory  All laboratory results reviewed without evidence of clinically relevant pathology.     Radiology All images reviewed independently. Agree with radiology report at this time.   MR SHOULDER LEFT WO CONTRAST Result Date: 09/01/2023 CLINICAL DATA:  Left shoulder and arm pain. Evaluate for rotator cuff tear. No prior left shoulder surgery. EXAM: MRI OF THE LEFT SHOULDER WITHOUT CONTRAST TECHNIQUE: Multiplanar, multisequence MR imaging of the shoulder was performed. No intravenous contrast was administered. COMPARISON:  Left shoulder radiograph 08/19/2023 FINDINGS: Despite efforts by the technologist and patient, mild motion artifact is present on today's exam and could not be eliminated. This reduces exam sensitivity and specificity. Rotator cuff: Mild anterior supraspinatus intermediate T2 signal tendinosis (sagittal series 7, images 12 and 13, coronal series 5 images 11 and 12). The infraspinatus, subscapularis, and teres minor are intact. Muscles: No rotator cuff muscle atrophy, fatty infiltration, or edema. Biceps long head: The intra-articular long head of the biceps tendon is intact. Acromioclavicular Joint: Normal variant persistent os acromiale. Minimal marrow edema across the synchondrosis. Normal alignment of the acromioclavicular joint without significant degenerative change. Trace fluid within the subacromial/subdeltoid bursa. Glenohumeral Joint: Mild mid glenoid cartilage thinning. Mild surface irregularity of the superomedial aspect of the humeral head cartilage. Small joint effusion. Labrum: There is linear increased T2 signal at the anteroinferior chondrolabral junction at the 8-9  o'clock location (axial series 4 images tendon 11) with a likely multilobulated paralabral cyst extending anteriorly and inferiorly and measuring up to approximately 17 x 10 x 20 mm (transverse by AP by craniocaudal, sagittal series 7, image 3 and axial series 4 images 12 through 14). This likely paralabral cyst extends deep to the subscapularis muscle. Bones:  No acute fracture. Other: None. IMPRESSION: 1. Mild anterior supraspinatus tendinosis. No rotator cuff tear. 2. Normal variant persistent os acromiale. Minimal marrow edema across the synchondrosis. 3. Mild glenohumeral cartilage degenerative changes. 4. Anteroinferior chondrolabral junction tear at the 8-9 o'clock location with a likely multilobulated paralabral cyst extending anteriorly and inferiorly and measuring up to approximately 17 x 10 x 20 mm. Electronically Signed   By: Bertina Broccoli M.D.   On: 09/01/2023 19:46    Final Reassessment and Plan:   Patient's history of present illness feels and findings are not consistent with any acute pathology.  On serial assessment she is resting comfortably in no acute distress.  Lab work shows no focal pathology. Patient feels that her symptoms are mostly related to constipation.  Given her clinical history of difficulty with defecation in the past, this is likely reasonable.  Discussed further workup in the emergency room but patient feels comfortable with trialing MiraLAX  in the  outpatient setting and following up with her primary care provider.     Clinical Impression:  1. Generalized abdominal pain      Discharge   Final Clinical Impression(s) / ED Diagnoses Final diagnoses:  Generalized abdominal pain    Rx / DC Orders ED Discharge Orders          Ordered    polyethylene glycol (MIRALAX ) 17 g packet   Once        09/25/23 0346              Onetha Bile, MD 09/25/23 [phone]

## 2023-09-25 NOTE — ED Triage Notes (Signed)
 Pt reports she is here today due to abd pain. Pt reports the pain started Monday. Pt reports RLQ pain. Pt denies any N&V&D. Pt reports only abd surgeries was gallbladder removal in 2018.

## 2023-12-11 ENCOUNTER — Encounter: Payer: Self-pay | Admitting: Nurse Practitioner

## 2023-12-11 ENCOUNTER — Ambulatory Visit: Admitting: Nurse Practitioner

## 2023-12-11 VITALS — BP 124/70 | HR 112 | Temp 98.1°F | Ht 62.0 in | Wt 193.0 lb

## 2023-12-11 DIAGNOSIS — G4733 Obstructive sleep apnea (adult) (pediatric): Secondary | ICD-10-CM | POA: Diagnosis not present

## 2023-12-11 NOTE — Progress Notes (Signed)
 @Patient  ID: Christina Valenzuela, female    DOB: 1975/09/29, 48 y.o.   MRN: [id number]  Chief Complaint  Patient presents with   Obstructive Sleep Apnea    Patient states the mask is suffocating     Referring provider: Jason Leita Repine,*  HPI: 48 year old female, former smoker followed for chronic insomnia and mild OSA. Past medical history significant for cervical radiculopathy, depression with anxiety, ETOH abuse, HTN.   TEST/EVENTS:  10/21/2022 NPSG: AHI 9.8/h, SpO2 low 86%  07/23/2022: OV with Dr. Neda. Difficulties falling asleep. Multiple night awakenings. If she doesn't take any medications, it can take up to 2-3 hours for her to fall asleep. Doesn't feel restored. Choking episodes at night. Has bee told she snores. She has gained weight since she quit smoking. Occasional migraine headaches. On trazodone  100 mg. In lab split night sleep study.  11/28/2022: Ov with Lucilla Petrenko NP for follow up to discuss home sleep study results, which revealed mild sleep apnea with AHI 9.8/h. She wants to discuss her treatment options today. She feels unchanged compared to her last visit. Her main concern is her inability to fall asleep at night. She feels tired the next day. Trazodone  isn't working well for her anymore. She denies drowsy driving or sleep parasomnias/paralysis.  She also is concerned about her left ear feeling full. Sounds are muffled. She denies any pain, drainage, fevers, sinus symptoms. Right ear feels fine and no hearing changes   02/27/2023: OV with Chamari Cutbirth NP for follow up. After our last visit, she started the ramelteon . This worked great for her. She felt like she hadn't slept that restfully in years. Woke up actually feeling refreshed. It did not make her groggy in the morning. She ran out of this so she hasn't been using it recently. She didn't realize to call for a refill. She would like to go back on it. She denies any drowsy driving, morning headaches, sleep  parasomnias/paralysis.  She's been having some issues with her blood pressure recently. She noticed it was high at her GYN appt last month. It was elevated today. She thinks it might be related to stress at her job. She has been having some problems with migraine headaches, and is now concerned it's related to her BP. She's not been checking it at home. Denies chest pain, vision changes, palpitations, leg swelling, orthopnea, PND.  She's also been having issues with frequent urination at night. She does tend to urinate a lot during the day as well. She tries to avoid drinking much before bed. She is thirsty a lot of the time. She has been told she has prediabetes before but has not had any recent testing. No unintentional weight loss, recent infections, vision changes, dizziness.   09/10/2023: OV with Ladiamond Gallina NP for follow up. She has ongoing issues with her sleep. She still feels tired during the day and urinating frequently at night. She has had to made further adjustments to her BP regimen with her PCP. Prior sleep aids didn't make much of a difference. She'd like to discuss alternative options.   12/11/2023: Today - follow up Discussed the use of AI scribe software for clinical note transcription with the patient, who gave verbal consent to proceed.  History of Present Illness Christina Valenzuela is a 48 year old female who presents with issues related to CPAP use for sleep apnea.  She experiences a sensation of suffocation when using her CPAP machine with a full face mask. Initially, she had  itching on her face, which she thought might be due to latex, but there's not latex in her mask. She tried multiple different masks and prefers the full face mask. She has since adjusted to the mask. Despite this adjustment, she continues to feel like she is suffocating when first putting on the mask.  On nights when she is able to fall asleep with the CPAP, she experiences some of the best sleep. However, she finds  that when she removes the mask to use the bathroom and then puts it back on, it feels different, often blowing too hard.  She is always anxious and is currently going through perimenopause. No mood changes.  No drowsy driving or sleep parasomnias/paralysis.   11/10/2023-12/09/2023 CPAP 5-15 cmH2O 11/30 days; 0% > 4 hr; average use 41 min Pressure 95th 6.1 Leaks 16.9 AHI 1    Allergies  Allergen Reactions   Hydrocodone -Acetaminophen  Itching   Latex Swelling    Vaginal swelling    Immunization History  Administered Date(s) Administered   Influenza,inj,Quad PF,6+ Mos 04/09/2016   PFIZER(Purple Top)SARS-COV-2 Vaccination 11/07/2019   Pneumococcal Polysaccharide-23 08/22/2015    Past Medical History:  Diagnosis Date   Depression     Tobacco History: Social History   Tobacco Use  Smoking Status Former   Current packs/day: 0.00   Types: Cigarettes   Quit date: 04/21/2022   Years since quitting: 1.6  Smokeless Tobacco Never   Counseling given: Not Answered   Outpatient Medications Prior to Visit  Medication Sig Dispense Refill   albuterol (VENTOLIN HFA) 108 (90 Base) MCG/ACT inhaler SMARTSIG:2 Puff(s) By Mouth Every 6 Hours PRN     amLODipine -valsartan  (EXFORGE ) 5-160 MG tablet Take 1 tablet by mouth daily. 90 tablet 0   diphenhydrAMINE  (BENADRYL ) 25 mg capsule Take 50 mg by mouth.     fluconazole  (DIFLUCAN ) 150 MG tablet Take 1 tablet (150 mg total) by mouth once. Repeat in 3 days if symptoms persist. (Patient not taking: Reported on 12/11/2023) 1 tablet 1   gabapentin  (NEURONTIN ) 300 MG capsule Take 1 capsule (300 mg total) by mouth 3 (three) times daily. (Patient not taking: Reported on 12/11/2023) 90 capsule 0   ibuprofen  (ADVIL ) 800 MG tablet Take 1 tablet (800 mg total) by mouth every 8 (eight) hours as needed. (Patient not taking: Reported on 12/11/2023) 60 tablet 0   metroNIDAZOLE  (NUVESSA ) 1.3 % GEL Place 1 applicator vaginally at bedtime. (Patient not taking: Reported  on 12/11/2023) 5 g 3   predniSONE  (DELTASONE ) 20 MG tablet Take 1 tablet (20 mg total) by mouth daily with breakfast. (Patient not taking: Reported on 12/11/2023) 5 tablet 0   ramelteon  (ROZEREM ) 8 MG tablet Take 1 tablet (8 mg total) by mouth at bedtime as needed for sleep. (Patient not taking: Reported on 12/11/2023) 30 tablet 5   triamcinolone  cream (KENALOG ) 0.1 % Apply 1 Application topically 2 (two) times daily. (Patient not taking: Reported on 12/11/2023) 454 g 0   valACYclovir  (VALTREX ) 500 MG tablet Take by mouth. (Patient not taking: Reported on 12/11/2023)     valACYclovir  (VALTREX ) 500 MG tablet Take 1 tablet (500 mg total) by mouth 2 (two) times daily. (Patient not taking: Reported on 12/11/2023) 6 tablet 11   No facility-administered medications prior to visit.     Review of Systems:   Constitutional: No weight loss or gain, night sweats, fevers, chills, or lassitude. +fatigue HEENT: No difficulty swallowing, tooth/dental problems, or sore throat. No sneezing, itching, ear ache, nasal congestion, or post nasal  drip. +headaches CV:  No chest pain, orthopnea, PND, swelling in lower extremities, anasarca, dizziness, palpitations, syncope Resp: +snoring GI:  No heartburn, indigestion GU: No nocturia  Endocrine: +polyuria, polydipsia  Psych: No depression or anxiety. Mood stable. +sleep disturbance    Physical Exam:  BP 124/70 (BP Location: Right Arm, Patient Position: Sitting)   Pulse (!) 112   Temp 98.1 F (36.7 C) (Oral)   Ht 5' 2 (1.575 m)   Wt 193 lb (87.5 kg)   LMP 08/31/2016   SpO2 97%   BMI 35.30 kg/m   GEN: Pleasant, interactive, well-appearing; obese; in no acute distress HEENT:  Normocephalic and atraumatic. PERRLA. Sclera white. Nasal turbinates pink, moist and patent bilaterally. No rhinorrhea present. Oropharynx pink and moist, without exudate or edema. No lesions, ulcerations, or postnasal drip.  NECK:  Supple w/ fair ROM. No lymphadenopathy.   CV: RRR, no m/r/g,  no peripheral edema. Pulses intact, +2 bilaterally. No cyanosis, pallor or clubbing. PULMONARY:  Unlabored, regular breathing. Clear bilaterally A&P w/o wheezes/rales/rhonchi. No accessory muscle use.  GI: BS present and normoactive. Soft, non-tender to palpation. No organomegaly or masses detected.  MSK: No erythema, warmth or tenderness. Cap refil <2 sec all extrem.  Neuro: A/Ox3. No focal deficits noted.   Skin: Warm, no lesions or rashe Psych: Normal affect and behavior. Judgement and thought content appropriate.     Lab Results:  CBC    Component Value Date/Time   WBC 10.4 09/25/2023 0312   RBC 4.76 09/25/2023 0312   HGB 13.0 09/25/2023 0312   HCT 39.4 09/25/2023 0312   PLT 273 09/25/2023 0312   MCV 82.8 09/25/2023 0312   MCH 27.3 09/25/2023 0312   MCHC 33.0 09/25/2023 0312   RDW 14.0 09/25/2023 0312   LYMPHSABS 3.9 09/25/2023 0312   MONOABS 0.7 09/25/2023 0312   EOSABS 0.1 09/25/2023 0312   BASOSABS 0.1 09/25/2023 0312    BMET    Component Value Date/Time   NA 138 09/25/2023 0312   K 3.7 09/25/2023 0312   CL 106 09/25/2023 0312   CO2 19 (L) 09/25/2023 0312   GLUCOSE 103 (H) 09/25/2023 0312   BUN 11 09/25/2023 0312   CREATININE 0.82 09/25/2023 0312   CREATININE 0.73 05/30/2015 1600   CALCIUM 9.8 09/25/2023 0312   GFRNONAA >60 09/25/2023 0312   GFRAA >60 12/01/2018 1528    BNP No results found for: BNP   Imaging:  No results found.   Administration History     None           No data to display          No results found for: NITRICOXIDE      Assessment & Plan:   Mild obstructive sleep apnea Mild OSA, on CPAP. Difficulties adjusting to therapy. Adjusted CPAP to 6-10 cmH2O, ramp turned off, EPR level 3 full time. Advised to notify of continued issues. She does seem to receive benefit from use. Encouraged to use nightly. Aware of proper use/care of device. Safe driving practices reviewed. Healthy weight loss encouraged.  Patient  Instructions  Continue to use CPAP every night, minimum of 4-6 hours a night.  Change equipment as directed. Wash your tubing with warm soap and water  daily, hang to dry. Wash humidifier portion weekly. Use bottled, distilled water  and change daily Be aware of reduced alertness and do not drive or operate heavy machinery if experiencing this or drowsiness.  Exercise encouraged, as tolerated. Healthy weight management discussed.  Avoid or decrease  alcohol consumption and medications that make you more sleepy, if possible. Notify if persistent daytime sleepiness occurs even with consistent use of PAP therapy.  Change CPAP supplies... Every month Mask cushions and/or nasal pillows CPAP machine filters Every 3 months Mask frame (not including the headgear) CPAP tubing Every 6 months Mask headgear Chin strap (if applicable) Humidifier water  tub  Adjusted your settings today Send me a message on Mychart if you're still having issues with the pressures  Follow up in 4 weeks with Katie Rieley Khalsa,NP. If symptoms do not improve or worsen, please contact office for sooner follow up       I spent 35 minutes of dedicated to the care of this patient on the date of this encounter to include pre-visit review of records, face-to-face time with the patient discussing conditions above, post visit ordering of testing, clinical documentation with the electronic health record, making appropriate referrals as documented, and communicating necessary findings to members of the patients care team.  Comer LULLA Rouleau, NP 12/11/2023  Pt aware and understands NP's role.

## 2023-12-11 NOTE — Patient Instructions (Addendum)
 Continue to use CPAP every night, minimum of 4-6 hours a night.  Change equipment as directed. Wash your tubing with warm soap and water  daily, hang to dry. Wash humidifier portion weekly. Use bottled, distilled water  and change daily Be aware of reduced alertness and do not drive or operate heavy machinery if experiencing this or drowsiness.  Exercise encouraged, as tolerated. Healthy weight management discussed.  Avoid or decrease alcohol consumption and medications that make you more sleepy, if possible. Notify if persistent daytime sleepiness occurs even with consistent use of PAP therapy.  Change CPAP supplies... Every month Mask cushions and/or nasal pillows CPAP machine filters Every 3 months Mask frame (not including the headgear) CPAP tubing Every 6 months Mask headgear Chin strap (if applicable) Humidifier water  tub  Adjusted your settings today Send me a message on Mychart if you're still having issues with the pressures  Follow up in 4 weeks with Katie Judyann Casasola,NP. If symptoms do not improve or worsen, please contact office for sooner follow up

## 2023-12-11 NOTE — Assessment & Plan Note (Addendum)
 Mild OSA, on CPAP. Difficulties adjusting to therapy. Adjusted CPAP to 6-10 cmH2O, ramp turned off, EPR level 3 full time. Advised to notify of continued issues. She does seem to receive benefit from use. Encouraged to use nightly. Aware of proper use/care of device. Safe driving practices reviewed. Healthy weight loss encouraged.  Patient Instructions  Continue to use CPAP every night, minimum of 4-6 hours a night.  Change equipment as directed. Wash your tubing with warm soap and water  daily, hang to dry. Wash humidifier portion weekly. Use bottled, distilled water  and change daily Be aware of reduced alertness and do not drive or operate heavy machinery if experiencing this or drowsiness.  Exercise encouraged, as tolerated. Healthy weight management discussed.  Avoid or decrease alcohol consumption and medications that make you more sleepy, if possible. Notify if persistent daytime sleepiness occurs even with consistent use of PAP therapy.  Change CPAP supplies... Every month Mask cushions and/or nasal pillows CPAP machine filters Every 3 months Mask frame (not including the headgear) CPAP tubing Every 6 months Mask headgear Chin strap (if applicable) Humidifier water  tub  Adjusted your settings today Send me a message on Mychart if you're still having issues with the pressures  Follow up in 4 weeks with Christina Stan Cantave,NP. If symptoms do not improve or worsen, please contact office for sooner follow up

## 2024-01-05 ENCOUNTER — Other Ambulatory Visit (HOSPITAL_BASED_OUTPATIENT_CLINIC_OR_DEPARTMENT_OTHER): Payer: Self-pay

## 2024-01-05 ENCOUNTER — Other Ambulatory Visit: Payer: Self-pay | Admitting: Family Medicine

## 2024-01-05 ENCOUNTER — Other Ambulatory Visit: Payer: Self-pay | Admitting: Family

## 2024-01-05 ENCOUNTER — Other Ambulatory Visit: Payer: Self-pay | Admitting: Sports Medicine

## 2024-01-05 ENCOUNTER — Other Ambulatory Visit: Payer: Self-pay

## 2024-01-05 DIAGNOSIS — B379 Candidiasis, unspecified: Secondary | ICD-10-CM

## 2024-01-05 MED ORDER — AMLODIPINE BESYLATE-VALSARTAN 5-160 MG PO TABS
1.0000 | ORAL_TABLET | Freq: Every day | ORAL | 0 refills | Status: AC
  Filled 2024-01-05: qty 90, 90d supply, fill #0

## 2024-01-06 ENCOUNTER — Other Ambulatory Visit: Payer: Self-pay

## 2024-01-07 ENCOUNTER — Other Ambulatory Visit (HOSPITAL_BASED_OUTPATIENT_CLINIC_OR_DEPARTMENT_OTHER): Payer: Self-pay

## 2024-01-07 MED ORDER — FLUCONAZOLE 150 MG PO TABS
150.0000 mg | ORAL_TABLET | Freq: Every day | ORAL | 1 refills | Status: DC
  Filled 2024-01-07: qty 1, 1d supply, fill #0
  Filled 2024-02-03: qty 1, 1d supply, fill #1

## 2024-01-14 ENCOUNTER — Ambulatory Visit: Admitting: Nurse Practitioner

## 2024-01-15 ENCOUNTER — Encounter: Payer: Self-pay | Admitting: Nurse Practitioner

## 2024-01-22 ENCOUNTER — Other Ambulatory Visit (HOSPITAL_BASED_OUTPATIENT_CLINIC_OR_DEPARTMENT_OTHER): Payer: Self-pay

## 2024-01-22 ENCOUNTER — Other Ambulatory Visit: Payer: Self-pay | Admitting: Sports Medicine

## 2024-01-25 ENCOUNTER — Other Ambulatory Visit: Payer: Self-pay | Admitting: Obstetrics and Gynecology

## 2024-01-25 ENCOUNTER — Other Ambulatory Visit (HOSPITAL_BASED_OUTPATIENT_CLINIC_OR_DEPARTMENT_OTHER): Payer: Self-pay

## 2024-01-25 DIAGNOSIS — B009 Herpesviral infection, unspecified: Secondary | ICD-10-CM

## 2024-01-25 MED ORDER — VALACYCLOVIR HCL 500 MG PO TABS
500.0000 mg | ORAL_TABLET | Freq: Two times a day (BID) | ORAL | 0 refills | Status: AC
  Filled 2024-01-25: qty 6, 3d supply, fill #0

## 2024-02-11 ENCOUNTER — Other Ambulatory Visit (HOSPITAL_BASED_OUTPATIENT_CLINIC_OR_DEPARTMENT_OTHER): Payer: Self-pay

## 2024-02-18 ENCOUNTER — Other Ambulatory Visit: Payer: Self-pay

## 2024-02-18 DIAGNOSIS — B379 Candidiasis, unspecified: Secondary | ICD-10-CM

## 2024-02-18 MED ORDER — FLUCONAZOLE 150 MG PO TABS
150.0000 mg | ORAL_TABLET | Freq: Every day | ORAL | 1 refills | Status: AC
Start: 2024-02-18 — End: ?

## 2024-03-26 ENCOUNTER — Telehealth: Admitting: Family Medicine

## 2024-03-26 DIAGNOSIS — J069 Acute upper respiratory infection, unspecified: Secondary | ICD-10-CM

## 2024-03-26 MED ORDER — FLUTICASONE PROPIONATE 50 MCG/ACT NA SUSP: 2.0000 | Freq: Every day | NASAL | 0 refills | Status: AC

## 2024-03-26 MED ORDER — BENZONATATE 100 MG PO CAPS: 100.0000 mg | ORAL_CAPSULE | Freq: Three times a day (TID) | ORAL | 0 refills | Status: AC | PRN

## 2024-03-26 NOTE — Patient Instructions (Signed)
 Christina Valenzuela, thank you for joining Roosvelt Mater, PA-C for today's virtual visit.  While this provider is not your primary care provider (PCP), if your PCP is located in our provider database this encounter information will be shared with them immediately following your visit.   A [facility] MyChart account gives you access to today's visit and all your visits, tests, and labs performed at [facility]  click here if you don't have a [facility] MyChart account or go to [email or web address]  Consent: (Patient) Christina Valenzuela provided verbal consent for this virtual visit at the beginning of the encounter.  Current Medications:  Current Outpatient Medications:    benzonatate  (TESSALON ) 100 MG capsule, Take 1 capsule (100 mg total) by mouth 3 (three) times daily as needed for up to 7 days., Disp: 21 capsule, Rfl: 0   fluticasone  (FLONASE ) 50 MCG/ACT nasal spray, Place 2 sprays into both nostrils daily., Disp: 16 g, Rfl: 0   albuterol (VENTOLIN HFA) 108 (90 Base) MCG/ACT inhaler, SMARTSIG:2 Puff(s) By Mouth Every 6 Hours PRN, Disp: , Rfl:    amLODipine -valsartan  (EXFORGE ) 5-160 MG tablet, Take 1 tablet by mouth daily., Disp: 90 tablet, Rfl: 0   diphenhydrAMINE  (BENADRYL ) 25 mg capsule, Take 50 mg by mouth., Disp: , Rfl:    fluconazole  (DIFLUCAN ) 150 MG tablet, Take 1 tablet (150 mg total) by mouth once. Repeat in 3 days if symptoms persist., Disp: 1 tablet, Rfl: 1   gabapentin  (NEURONTIN ) 300 MG capsule, Take 1 capsule (300 mg total) by mouth 3 (three) times daily. (Patient not taking: Reported on 12/11/2023), Disp: 90 capsule, Rfl: 0   ibuprofen  (ADVIL ) 800 MG tablet, Take 1 tablet (800 mg total) by mouth every 8 (eight) hours as needed. (Patient not taking: Reported on 12/11/2023), Disp: 60 tablet, Rfl: 0   metroNIDAZOLE  (NUVESSA ) 1.3 % GEL, Place 1 applicator vaginally at bedtime. (Patient not taking: Reported on 12/11/2023), Disp: 5 g, Rfl: 3   predniSONE  (DELTASONE ) 20 MG  tablet, Take 1 tablet (20 mg total) by mouth daily with breakfast. (Patient not taking: Reported on 12/11/2023), Disp: 5 tablet, Rfl: 0   ramelteon  (ROZEREM ) 8 MG tablet, Take 1 tablet (8 mg total) by mouth at bedtime as needed for sleep. (Patient not taking: Reported on 12/11/2023), Disp: 30 tablet, Rfl: 5   triamcinolone  cream (KENALOG ) 0.1 %, Apply 1 Application topically 2 (two) times daily. (Patient not taking: Reported on 12/11/2023), Disp: 454 g, Rfl: 0   valACYclovir  (VALTREX ) 500 MG tablet, Take by mouth. (Patient not taking: Reported on 12/11/2023), Disp: , Rfl:    valACYclovir  (VALTREX ) 500 MG tablet, Take 1 tablet (500 mg total) by mouth 2 (two) times daily., Disp: 6 tablet, Rfl: 0   Medications ordered in this encounter:  Meds ordered this encounter  Medications   fluticasone  (FLONASE ) 50 MCG/ACT nasal spray    Sig: Place 2 sprays into both nostrils daily.    Dispense:  16 g    Refill:  0   benzonatate  (TESSALON ) 100 MG capsule    Sig: Take 1 capsule (100 mg total) by mouth 3 (three) times daily as needed for up to 7 days.    Dispense:  21 capsule    Refill:  0     *If you need refills on other medications prior to your next appointment, please contact your pharmacy*  Follow-Up: Call back or seek an in-person evaluation if the symptoms worsen or if the condition fails to improve as anticipated.  [facility]  Virtual Care [phone]  Other Instructions Upper Respiratory Infection, Adult An upper respiratory infection (URI) is a common viral infection of the nose, throat, and upper air passages that lead to the lungs. The most common type of URI is the common cold. URIs usually get better on their own, without medical treatment. What are the causes? A URI is caused by a virus. You may catch a virus by: Breathing in droplets from an infected person's cough or sneeze. Touching something that has been exposed to the virus (is contaminated) and then touching your mouth, nose, or  eyes. What increases the risk? You are more likely to get a URI if: You are very young or very old. You have close contact with others, such as at work, school, or a health care facility. You smoke. You have long-term (chronic) heart or lung disease. You have a weakened disease-fighting system (immune system). You have nasal allergies or asthma. You are experiencing a lot of stress. You have poor nutrition. What are the signs or symptoms? A URI usually involves some of the following symptoms: Runny or stuffy (congested) nose. Cough. Sneezing. Sore throat. Headache. Fatigue. Fever. Loss of appetite. Pain in your forehead, behind your eyes, and over your cheekbones (sinus pain). Muscle aches. Redness or irritation of the eyes. Pressure in the ears or face. How is this diagnosed? This condition may be diagnosed based on your medical history and symptoms, and a physical exam. Your health care provider may use a swab to take a mucus sample from your nose (nasal swab). This sample can be tested to determine what virus is causing the illness. How is this treated? URIs usually get better on their own within 7-10 days. Medicines cannot cure URIs, but your health care provider may recommend certain medicines to help relieve symptoms, such as: Over-the-counter cold medicines. Cough suppressants. Coughing is a type of defense against infection that helps to clear the respiratory system, so take these medicines only as recommended by your health care provider. Fever-reducing medicines. Follow these instructions at home: Activity Rest as needed. If you have a fever, stay home from work or school until your fever is gone or until your health care provider says your URI cannot spread to other people (is no longer contagious). Your health care provider may have you wear a face mask to prevent your infection from spreading. Relieving symptoms Gargle with a mixture of salt and water  3-4 times a day  or as needed. To make salt water , completely dissolve -1 tsp (3-6 g) of salt in 1 cup (237 mL) of warm water . Use a cool-mist humidifier to add moisture to the air. This can help you breathe more easily. Eating and drinking  Drink enough fluid to keep your urine pale yellow. Eat soups and other clear broths. General instructions  Take over-the-counter and prescription medicines only as told by your health care provider. These include cold medicines, fever reducers, and cough suppressants. Do not use any products that contain nicotine or tobacco. These products include cigarettes, chewing tobacco, and vaping devices, such as e-cigarettes. If you need help quitting, ask your health care provider. Stay away from secondhand smoke. Stay up to date on all immunizations, including the yearly (annual) flu vaccine. Keep all follow-up visits. This is important. How to prevent the spread of infection to others URIs can be contagious. To prevent the infection from spreading: Wash your hands with soap and water  for at least 20 seconds. If soap and water  are not  available, use hand sanitizer. Avoid touching your mouth, face, eyes, or nose. Cough or sneeze into a tissue or your sleeve or elbow instead of into your hand or into the air.  Contact a health care provider if: You are getting worse instead of better. You have a fever or chills. Your mucus is brown or red. You have yellow or brown discharge coming from your nose. You have pain in your face, especially when you bend forward. You have swollen neck glands. You have pain while swallowing. You have white areas in the back of your throat. Get help right away if: You have shortness of breath that gets worse. You have severe or persistent: Headache. Ear pain. Sinus pain. Chest pain. You have chronic lung disease along with any of the following: Making high-pitched whistling sounds when you breathe, most often when you breathe out  (wheezing). Prolonged cough (more than 14 days). Coughing up blood. A change in your usual mucus. You have a stiff neck. You have changes in your: Vision. Hearing. Thinking. Mood. These symptoms may be an emergency. Get help right away. Call 911. Do not wait to see if the symptoms will go away. Do not drive yourself to the hospital. Summary An upper respiratory infection (URI) is a common infection of the nose, throat, and upper air passages that lead to the lungs. A URI is caused by a virus. URIs usually get better on their own within 7-10 days. Medicines cannot cure URIs, but your health care provider may recommend certain medicines to help relieve symptoms. This information is not intended to replace advice given to you by your health care provider. Make sure you discuss any questions you have with your health care provider. Document Revised: 11/21/2020 Document Reviewed: 11/21/2020 Elsevier Patient Education  2024 [employer]   If you have been instructed to have an in-person evaluation today at a local Urgent Care facility, please use the link below. It will take you to a list of all of our available [facility], including address, phone number and hours of operation. Please do not delay care.  [facility]  If you or a family member do not have a primary care provider, use the link below to schedule a visit and establish care. When you choose a [facility] primary care physician or advanced practice provider, you gain a long-term partner in health. Find a Primary Care Provider  Learn more about [facility]'s in-office and virtual care options: [facility] - Get Care Now

## 2024-03-26 NOTE — Progress Notes (Signed)
 Virtual Visit Consent   Christina Valenzuela, you are scheduled for a virtual visit with a [facility] provider today. Just as with appointments in the office, your consent must be obtained to participate. Your consent will be active for this visit and any virtual visit you may have with one of our providers in the next 365 days. If you have a MyChart account, a copy of this consent can be sent to you electronically.  As this is a virtual visit, video technology does not allow for your provider to perform a traditional examination. This may limit your provider's ability to fully assess your condition. If your provider identifies any concerns that need to be evaluated in person or the need to arrange testing (such as labs, EKG, etc.), we will make arrangements to do so. Although advances in technology are sophisticated, we cannot ensure that it will always work on either your end or our end. If the connection with a video visit is poor, the visit may have to be switched to a telephone visit. With either a video or telephone visit, we are not always able to ensure that we have a secure connection.  By engaging in this virtual visit, you consent to the provision of healthcare and authorize for your insurance to be billed (if applicable) for the services provided during this visit. Depending on your insurance coverage, you may receive a charge related to this service.  I need to obtain your verbal consent now. Are you willing to proceed with your visit today? Christina Valenzuela has provided verbal consent on 03/26/2024 for a virtual visit (video or telephone). Christina Valenzuela, [place]  Date: 03/26/2024 10:24 AM   Virtual Visit via Video Note   I, Christina Valenzuela, connected with  Christina Valenzuela  ([id number], 16-Nov-1975) on 03/26/24 at 10:00 AM EST by a video-enabled telemedicine application and verified that I am speaking with the correct person using two identifiers.  Location: Patient: Virtual Visit  Location Patient: Home Provider: Virtual Visit Location Provider: Home Office   I discussed the limitations of evaluation and management by telemedicine and the availability of in person appointments. The patient expressed understanding and agreed to proceed.    History of Present Illness: Christina Valenzuela is a 48 y.o. who identifies as a female who was assigned female at birth, and is being seen today for c/o she is unsure if she has the Flu but doesn't know.  Pt states last week started with itchy throat, sneezing.  Pt states she feels like she woke up like she was hit by a truck.  Pt states she is congested.  Pt denies taking a flu or covid test. Pt states she has no energy.  Pt states taking Thera Flu and Mucinex . Pt states she has a headache and no longer haas self reported fever after taking medications.   Video connection was lost when less than 50% of the duration of the visit was complete, at which time the remainder of the visit was completed via audio only.   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   Mixed incontinence 10/24/2016   Chronic anemia 09/08/2015   Pica 09/08/2015   Primary insomnia 08/22/2015   Depression with anxiety 08/22/2015   Alcohol abuse 08/22/2015   Headache 08/22/2015   UTI symptoms 02/16/2014   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    Allergies:  Allergies  Allergen Reactions    Hydrocodone -Acetaminophen  Itching   Latex Swelling    Vaginal swelling   Medications:  Current Outpatient Medications:    benzonatate  (TESSALON ) 100 MG capsule, Take 1 capsule (100 mg total) by mouth 3 (three) times daily as needed for up to 7 days., Disp: 21 capsule, Rfl: 0   fluticasone  (FLONASE ) 50 MCG/ACT nasal spray, Place 2 sprays into both nostrils daily., Disp: 16 g, Rfl: 0   albuterol (VENTOLIN HFA) 108 (90 Base) MCG/ACT inhaler, SMARTSIG:2 Puff(s) By Mouth Every 6 Hours PRN, Disp: , Rfl:    amLODipine -valsartan  (EXFORGE ) 5-160 MG tablet, Take 1 tablet by mouth daily., Disp: 90 tablet, Rfl: 0   diphenhydrAMINE  (BENADRYL ) 25 mg capsule, Take 50 mg by mouth., Disp: , Rfl:    fluconazole  (DIFLUCAN ) 150 MG tablet, Take 1 tablet (150 mg total) by mouth once. Repeat in 3 days if symptoms persist., Disp: 1 tablet, Rfl: 1   gabapentin  (NEURONTIN ) 300 MG capsule, Take 1 capsule (300 mg total) by mouth 3 (three) times daily. (Patient not taking: Reported on 12/11/2023), Disp: 90 capsule, Rfl: 0   ibuprofen  (ADVIL ) 800 MG tablet, Take 1 tablet (800 mg total) by mouth every 8 (eight) hours as needed. (Patient not taking: Reported on 12/11/2023), Disp: 60 tablet, Rfl: 0   metroNIDAZOLE  (NUVESSA ) 1.3 % GEL, Place 1 applicator vaginally at bedtime. (Patient not taking: Reported on 12/11/2023), Disp: 5 g, Rfl: 3   predniSONE  (DELTASONE ) 20 MG tablet, Take 1 tablet (20 mg total) by mouth daily with breakfast. (Patient not taking: Reported on 12/11/2023), Disp: 5 tablet, Rfl: 0   ramelteon  (ROZEREM ) 8 MG tablet, Take 1 tablet (8 mg total) by mouth at bedtime as needed for sleep. (Patient not taking: Reported on 12/11/2023), Disp: 30 tablet, Rfl: 5   triamcinolone  cream (KENALOG ) 0.1 %, Apply 1 Application topically 2 (two) times daily. (Patient not taking: Reported on 12/11/2023), Disp: 454 g, Rfl: 0   valACYclovir  (VALTREX ) 500 MG tablet, Take by mouth. (Patient not taking: Reported on 12/11/2023), Disp: , Rfl:     valACYclovir  (VALTREX ) 500 MG tablet, Take 1 tablet (500 mg total) by mouth 2 (two) times daily., Disp: 6 tablet, Rfl: 0  Observations/Objective: Patient is well-developed, well-nourished in no acute distress.  Resting comfortably at home.  Head is normocephalic, atraumatic.  No labored breathing.  Speech is clear and coherent with logical content.  Patient is alert and oriented at baseline.    Assessment and Plan: 1. Upper respiratory tract infection, unspecified type (Primary) - fluticasone  (FLONASE ) 50 MCG/ACT nasal spray; Place 2 sprays into both nostrils daily.  Dispense: 16 g; Refill: 0 - benzonatate  (TESSALON ) 100 MG capsule; Take 1 capsule (100 mg total) by mouth 3 (three) times daily as needed for up to 7 days.  Dispense: 21 capsule; Refill: 0  -Advised Pt to purchase Flu and covid test  -Continue conservative treatments -Pt to F/U in person worsening symptoms  Follow Up Instructions: I discussed the assessment and treatment plan with the patient. The patient was provided an opportunity to ask questions and all  were answered. The patient agreed with the plan and demonstrated an understanding of the instructions.  A copy of instructions were sent to the patient via MyChart unless otherwise noted below.    The patient was advised to call back or seek an in-person evaluation if the symptoms worsen or if the condition fails to improve as anticipated.    Christina Mater, PA-C

## 2024-04-27 ENCOUNTER — Other Ambulatory Visit (HOSPITAL_BASED_OUTPATIENT_CLINIC_OR_DEPARTMENT_OTHER): Payer: Self-pay

## 2024-04-27 MED ORDER — METHOCARBAMOL 500 MG PO TABS
500.0000 mg | ORAL_TABLET | Freq: Four times a day (QID) | ORAL | 0 refills | Status: AC
  Filled 2024-04-27 (×2): qty 40, 10d supply, fill #0

## 2024-04-27 MED ORDER — ONDANSETRON 4 MG PO TBDP
4.0000 mg | ORAL_TABLET | Freq: Three times a day (TID) | ORAL | 0 refills | Status: AC | PRN
  Filled 2024-04-27 (×2): qty 20, 7d supply, fill #0

## 2024-05-23 ENCOUNTER — Other Ambulatory Visit: Payer: Self-pay | Admitting: Family Medicine

## 2024-05-23 ENCOUNTER — Other Ambulatory Visit: Payer: Self-pay

## 2024-05-25 ENCOUNTER — Other Ambulatory Visit (HOSPITAL_BASED_OUTPATIENT_CLINIC_OR_DEPARTMENT_OTHER): Payer: Self-pay

## 2024-05-25 MED ORDER — NUVESSA 1.3 % VA GEL
1.0000 | Freq: Every day | VAGINAL | 3 refills | Status: AC
  Filled 2024-05-25: qty 5, 5d supply, fill #0
